# Patient Record
Sex: Female | Born: 1945 | Race: White | Hispanic: No | Marital: Married | Smoking: Former smoker
Health system: Southern US, Community
[De-identification: ages and names within clinical notes are randomized; demographics above are authoritative.]

## PROBLEM LIST (undated history)

## (undated) DIAGNOSIS — C50919 Malignant neoplasm of unspecified site of unspecified female breast: Secondary | ICD-10-CM

## (undated) DIAGNOSIS — I10 Essential (primary) hypertension: Secondary | ICD-10-CM

## (undated) DIAGNOSIS — Z8616 Personal history of COVID-19: Secondary | ICD-10-CM

## (undated) DIAGNOSIS — Z923 Personal history of irradiation: Secondary | ICD-10-CM

## (undated) HISTORY — DX: Personal history of COVID-19: Z86.16

## (undated) HISTORY — PX: PARTIAL MASTECTOMY WITH AXILLARY SENTINEL LYMPH NODE BIOPSY: SHX6004

## (undated) HISTORY — PX: ABDOMINAL HYSTERECTOMY: SHX81

## (undated) HISTORY — PX: COMBINED HYSTEROSCOPY DIAGNOSTIC / D&C: SUR297

---

## 2013-03-30 DIAGNOSIS — Z923 Personal history of irradiation: Secondary | ICD-10-CM

## 2013-03-30 DIAGNOSIS — C50919 Malignant neoplasm of unspecified site of unspecified female breast: Secondary | ICD-10-CM

## 2013-03-30 HISTORY — DX: Malignant neoplasm of unspecified site of unspecified female breast: C50.919

## 2013-03-30 HISTORY — DX: Personal history of irradiation: Z92.3

## 2014-01-08 DIAGNOSIS — I10 Essential (primary) hypertension: Secondary | ICD-10-CM | POA: Diagnosis present

## 2014-01-31 ENCOUNTER — Ambulatory Visit: Payer: Self-pay | Admitting: Physician Assistant

## 2014-02-19 ENCOUNTER — Ambulatory Visit: Payer: Self-pay | Admitting: Physician Assistant

## 2014-02-27 ENCOUNTER — Ambulatory Visit: Payer: Self-pay | Admitting: Physician Assistant

## 2014-02-27 HISTORY — PX: BREAST BIOPSY: SHX20

## 2014-03-05 ENCOUNTER — Other Ambulatory Visit: Payer: Self-pay | Admitting: Surgery

## 2014-03-05 ENCOUNTER — Ambulatory Visit
Admission: RE | Admit: 2014-03-05 | Discharge: 2014-03-05 | Disposition: A | Discharge status: Home / Self Care | Payer: 59 | Source: Ambulatory Visit | Attending: Surgery | Admitting: Surgery

## 2014-03-05 DIAGNOSIS — R920 Mammographic microcalcification found on diagnostic imaging of breast: Secondary | ICD-10-CM

## 2014-03-05 DIAGNOSIS — C50312 Malignant neoplasm of lower-inner quadrant of left female breast: Secondary | ICD-10-CM

## 2014-03-05 MED ORDER — GADOBENATE DIMEGLUMINE 529 MG/ML IV SOLN
12.0000 mL | Freq: Once | INTRAVENOUS | Status: AC | PRN
  Administered 2014-03-05: 12 mL via INTRAVENOUS

## 2014-03-07 ENCOUNTER — Ambulatory Visit: Payer: Self-pay | Admitting: Internal Medicine

## 2014-03-15 ENCOUNTER — Ambulatory Visit: Payer: Self-pay | Admitting: Surgery

## 2014-03-15 HISTORY — PX: BREAST BIOPSY: SHX20

## 2014-03-29 ENCOUNTER — Ambulatory Visit: Payer: Self-pay | Admitting: Surgery

## 2014-03-29 LAB — POTASSIUM: Potassium: 3.9 mmol/L (ref 3.5–5.1)

## 2014-03-30 ENCOUNTER — Ambulatory Visit: Payer: Self-pay | Admitting: Internal Medicine

## 2014-04-03 HISTORY — PX: BREAST LUMPECTOMY: SHX2

## 2014-04-05 ENCOUNTER — Ambulatory Visit: Payer: Self-pay | Admitting: Surgery

## 2014-04-23 ENCOUNTER — Encounter: Payer: Self-pay | Admitting: Internal Medicine

## 2014-04-27 ENCOUNTER — Ambulatory Visit: Payer: Self-pay | Admitting: Surgery

## 2014-04-27 HISTORY — PX: BREAST LUMPECTOMY: SHX2

## 2014-04-30 ENCOUNTER — Ambulatory Visit: Payer: Self-pay | Admitting: Internal Medicine

## 2014-05-29 ENCOUNTER — Ambulatory Visit
Admit: 2014-05-29 | Disposition: A | Discharge status: Home / Self Care | Payer: Self-pay | Attending: Internal Medicine | Admitting: Internal Medicine

## 2014-06-22 LAB — CBC CANCER CENTER
Basophil #: 0.1 x10 3/mm (ref 0.0–0.1)
Basophil %: 1.2 %
Eosinophil #: 0.2 x10 3/mm (ref 0.0–0.7)
Eosinophil %: 4.7 %
HCT: 42.9 % (ref 35.0–47.0)
HGB: 14.3 g/dL (ref 12.0–16.0)
LYMPHS PCT: 30.2 %
Lymphocyte #: 1.4 x10 3/mm (ref 1.0–3.6)
MCH: 30.5 pg (ref 26.0–34.0)
MCHC: 33.4 g/dL (ref 32.0–36.0)
MCV: 92 fL (ref 80–100)
MONOS PCT: 11.1 %
Monocyte #: 0.5 x10 3/mm (ref 0.2–0.9)
Neutrophil #: 2.5 x10 3/mm (ref 1.4–6.5)
Neutrophil %: 52.8 %
PLATELETS: 234 x10 3/mm (ref 150–440)
RBC: 4.69 10*6/uL (ref 3.80–5.20)
RDW: 13.1 % (ref 11.5–14.5)
WBC: 4.7 x10 3/mm (ref 3.6–11.0)

## 2014-06-27 LAB — CBC CANCER CENTER
BASOS ABS: 0.1 x10 3/mm (ref 0.0–0.1)
Basophil %: 1.2 %
EOS ABS: 0.3 x10 3/mm (ref 0.0–0.7)
Eosinophil %: 5.2 %
HCT: 42.6 % (ref 35.0–47.0)
HGB: 14.3 g/dL (ref 12.0–16.0)
LYMPHS ABS: 1.4 x10 3/mm (ref 1.0–3.6)
Lymphocyte %: 27.6 %
MCH: 30.7 pg (ref 26.0–34.0)
MCHC: 33.5 g/dL (ref 32.0–36.0)
MCV: 92 fL (ref 80–100)
Monocyte #: 0.6 x10 3/mm (ref 0.2–0.9)
Monocyte %: 12.4 %
Neutrophil #: 2.7 x10 3/mm (ref 1.4–6.5)
Neutrophil %: 53.6 %
PLATELETS: 229 x10 3/mm (ref 150–440)
RBC: 4.65 10*6/uL (ref 3.80–5.20)
RDW: 13.1 % (ref 11.5–14.5)
WBC: 5 x10 3/mm (ref 3.6–11.0)

## 2014-06-29 ENCOUNTER — Ambulatory Visit
Admit: 2014-06-29 | Disposition: A | Discharge status: Home / Self Care | Payer: Self-pay | Attending: Internal Medicine | Admitting: Internal Medicine

## 2014-07-04 LAB — CBC CANCER CENTER
BASOS ABS: 0.1 x10 3/mm (ref 0.0–0.1)
Basophil %: 1.2 %
EOS ABS: 0.2 x10 3/mm (ref 0.0–0.7)
Eosinophil %: 4.2 %
HCT: 42.4 % (ref 35.0–47.0)
HGB: 14.3 g/dL (ref 12.0–16.0)
Lymphocyte #: 1.2 x10 3/mm (ref 1.0–3.6)
Lymphocyte %: 23.5 %
MCH: 31 pg (ref 26.0–34.0)
MCHC: 33.7 g/dL (ref 32.0–36.0)
MCV: 92 fL (ref 80–100)
Monocyte #: 0.6 x10 3/mm (ref 0.2–0.9)
Monocyte %: 11.9 %
NEUTROS ABS: 3.1 x10 3/mm (ref 1.4–6.5)
Neutrophil %: 59.2 %
PLATELETS: 213 x10 3/mm (ref 150–440)
RBC: 4.62 10*6/uL (ref 3.80–5.20)
RDW: 13 % (ref 11.5–14.5)
WBC: 5.2 x10 3/mm (ref 3.6–11.0)

## 2014-07-11 LAB — CBC CANCER CENTER
BASOS ABS: 0.1 x10 3/mm (ref 0.0–0.1)
Basophil %: 1.7 %
EOS ABS: 0.3 x10 3/mm (ref 0.0–0.7)
Eosinophil %: 5.9 %
HCT: 43.3 % (ref 35.0–47.0)
HGB: 14.6 g/dL (ref 12.0–16.0)
LYMPHS ABS: 1.2 x10 3/mm (ref 1.0–3.6)
Lymphocyte %: 25.7 %
MCH: 30.9 pg (ref 26.0–34.0)
MCHC: 33.6 g/dL (ref 32.0–36.0)
MCV: 92 fL (ref 80–100)
MONO ABS: 0.6 x10 3/mm (ref 0.2–0.9)
Monocyte %: 12.1 %
NEUTROS PCT: 54.6 %
Neutrophil #: 2.6 x10 3/mm (ref 1.4–6.5)
PLATELETS: 227 x10 3/mm (ref 150–440)
RBC: 4.7 10*6/uL (ref 3.80–5.20)
RDW: 13 % (ref 11.5–14.5)
WBC: 4.8 x10 3/mm (ref 3.6–11.0)

## 2014-07-17 LAB — CBC CANCER CENTER
Basophil #: 0.1 x10 3/mm (ref 0.0–0.1)
Basophil %: 1.2 %
EOS ABS: 0.3 x10 3/mm (ref 0.0–0.7)
Eosinophil %: 5.3 %
HCT: 44.1 % (ref 35.0–47.0)
HGB: 14.8 g/dL (ref 12.0–16.0)
LYMPHS PCT: 26.3 %
Lymphocyte #: 1.3 x10 3/mm (ref 1.0–3.6)
MCH: 30.7 pg (ref 26.0–34.0)
MCHC: 33.5 g/dL (ref 32.0–36.0)
MCV: 92 fL (ref 80–100)
Monocyte #: 0.5 x10 3/mm (ref 0.2–0.9)
Monocyte %: 9.7 %
NEUTROS ABS: 2.8 x10 3/mm (ref 1.4–6.5)
NEUTROS PCT: 57.5 %
PLATELETS: 227 x10 3/mm (ref 150–440)
RBC: 4.81 10*6/uL (ref 3.80–5.20)
RDW: 13.4 % (ref 11.5–14.5)
WBC: 4.9 x10 3/mm (ref 3.6–11.0)

## 2014-07-17 LAB — HEPATIC FUNCTION PANEL A (ARMC)
ALK PHOS: 70 U/L
ALT: 16 U/L
AST: 20 U/L
Albumin: 4.4 g/dL
BILIRUBIN TOTAL: 0.7 mg/dL
Bilirubin, Direct: <0.1 mg/dL
Total Protein: 7.4 g/dL

## 2014-07-17 LAB — CREATININE, SERUM
Creatinine: 0.74 mg/dL
EGFR (African American): >60
EGFR (Non-African Amer.): >60

## 2014-07-23 LAB — SURGICAL PATHOLOGY

## 2014-07-29 NOTE — Op Note (Signed)
PATIENT NAME:  Dana Snow, Dana Snow MR#:  [id number] DATE OF BIRTH:  June 02, 1945  DATE OF PROCEDURE:  04/27/2014   PREOPERATIVE DIAGNOSIS: Carcinoma of the left breast.   POSTOPERATIVE DIAGNOSIS: Carcinoma of the left breast.   PROCEDURE: Re-excision of left partial mastectomy.   SURGEON: Rochel Brome, MD    ANESTHESIA: General.   INDICATIONS: This 69 year old female recently had findings of carcinoma of the lower inner quadrant of the left breast with mixed ductal and lobular features. She recently had a left partial mastectomy.  The final pathology demonstrated involvement of the medial margin and re-excision of the medial margin was recommended for further treatment.  It is further noted that her ultrasound prior to her recent surgery, demonstrated a 2.5 cm mass, which I have reviewed today, which did demonstrate that the mass was in close proximity to the underlying muscle and therefore, this excision, today did extend down as far as muscle.   The patient was placed on the operating table in the supine position under general anesthesia. The left breast was prepared with ChloraPrep and draped in a sterile manner.   The incision, which was curvilinear in the lower outer quadrant was reopened beginning with a scalpel and then with blunt dissection and removed old suture material. There was a seroma with some clotted blood within the wound which was evacuated with suction. The wound was inspected and did not identify any obvious tumor within the wound.  I could identify the deep fascia and determine a site for re-excision of the medial margin so that the excision was approximately 3 x 3 cm in thickness, was approximately 7 mm as the medial margin was re-excised with the use of electrocautery.  The orientation was maintained and used margin maps to label the skin edge.  Also the deep margin, the cranial margin, the [facility] margin and also the new medial margin. The specimen was submitted for pathology  fresh.   The wound was further inspected. It is noted that during the course of the procedure, a number of small bleeding points were cauterized.  Next, the tissues surrounding the re-excision were infiltrated with 0.5% Sensorcaine with epinephrine. Also subcutaneous tissues were infiltrated as well. Several small bleeding points were cauterized. Hemostasis was subsequently intact. Next, the subcutaneous tissues were closed with interrupted 4-0 chromic and then the skin was closed with a running 4-0 Monocryl subcuticular suture.   The pathologist did call back to indicate that the microscope studies showed no cancer and tissues have been submitted for routine pathology. The wound was then treated with LiquiBand and allowed to dry.   The patient tolerated surgery satisfactorily and was then prepared for transfer to the recovery room.     ____________________________ Lenna Sciara. Rochel Brome, MD jws:DT D: 04/27/2014 11:57:43 ET T: 04/27/2014 14:46:06 ET JOB#: [id number]  cc: Loreli Dollar, MD, <Dictator> Loreli Dollar MD ELECTRONICALLY SIGNED 04/28/2014 6:50

## 2014-07-29 NOTE — Op Note (Signed)
PATIENT NAME:  Dana Snow, Dana Snow MR#:  [id number] DATE OF BIRTH:  08-Feb-1946  DATE OF PROCEDURE:  04/05/2014  REOPERATIVE DIAGNOSIS: Carcinoma of the left breast.   POSTOPERATIVE DIAGNOSIS: Carcinoma of the left breast.   PROCEDURE: Left partial mastectomy with axillary sentinel lymph node biopsy.   SURGEON: Rochel Brome, MD.   ANESTHESIA: General.   INDICATIONS:  This 69 year old female recently had mammogram depicting evidence of mass formation in the inferior medial aspect of the left breast. Ultrasound demonstrated a mass in this location 6 cm from the nipple and adjacent to the chest wall. Biopsy demonstrated invasive lobular carcinoma. Surgery was recommended for definitive treatment.   DESCRIPTION OF PROCEDURE: The patient did have preoperative ultrasound guided insertion of Kopans wire with followup mammogram, also had injection of radioactive technetium sulfur colloid.    The patient was placed on the operating table in the supine position under general anesthesia. The dressing was removed from the left breast exposing the Kopans wire, which entered the breast at approximately the 8 o'clock position. The Kopans wire was cut 2 cm from the skin. Ultrasound was used to image the mass at the 8 o'clock position 6 cm from the nipple. The mammogram images were also viewed. The breast was prepared with ChloraPrep and draped in a sterile manner.   An incision was made from approximately 7 o'clock to 9 o'clock position of the left breast approximately 6 cm from the nipple and excised an ellipse of skin which was approximately 1 cm in width. The dissection was carried down through subcutaneous tissues and dissection was carried down to encounter the wire. Palpation revealed the location of the tumor. The dissection was carried down deep to surround the tumor, removing normal tissue surrounding the palpable mass and the dissection did extend down to the deep fascia. The specimen was tagged with margin  maps before it was completely removed from the wound to mark the cranial, caudal, medial, lateral, and subsequently deep margin. The 9 o'clock end of the skin ellipse was tagged with a stitch for the pathologist's orientation. The specimen was submitted for specimen mammogram and pathology.   Attention was turned to the axilla which was probed with a gamma counter demonstrating location of radioactivity in the inferior aspect of the left axilla. An oblique incision was made ultimately some 4.5 cm in length, carried down through subcutaneous tissues through superficial fascia, and dissected down deeply within the inferior aspect of the axilla. There was radioactivity found and a lymph node found adjacent to the rib cage which was approximately 8-9 mm in dimension and was removed with some surrounding fatty tissue. The ex vivo count was in the range of 100-130 counts per second. The background count was in the range of 3-21. There was no remaining palpable mass within the axilla, no localized radioactivity. Hemostasis was intact.   The partial mastectomy wound was further inspected and determined hemostasis was intact. It is noted that the radiologist called back to indicate that the biopsy marker and the mass were seen within the specimen mammogram and there did appear to be 1 margin which appeared close. Later the pathologist called back to indicate that the superior margin appeared to be close and recommended re-excision of the superior margin, also recommended re-excision of the inferior margin.     The superior margin was re-excised removing approximately 3 x 3 cm portion of tissue which was approximately 5 mm in thickness, this was oriented so that the new margin was placed on  Telfa and the anterior border of this re-excision was tagged with a stitch which attached it to the Telfa and this was submitted for pathology. Also the inferior margin was re-excised with an approximately 3 x 3 cm x 5 mm margin,  which appeared to be fatty tissue and the new margin was placed on Telfa and a stitch was placed to mark the anterior extent. This was submitted for pathology.   Both wounds were further inspected and determined that hemostasis was intact. Next the axillary wound was closed by placing 4-0 Chromic subcutaneous sutures in and then closed the skin with running 4-0 Monocryl subcuticular suture.   The pathologist called back and had done microscope study on the superior margin and determined that this appeared to be free of tumor and elected to delay the inferior margin for routine pathology.   The partial mastectomy wound was further inspected and determined hemostasis was intact. The skin edges were infiltrated with 0.5% Sensorcaine with epinephrine. The subcutaneous tissues were closed with interrupted 4-0 Chromic and the skin was closed with running 4-0 Monocryl subcuticular suture.   Both wounds were treated with LiquiBand and allowed to dry.     The patient appeared to be in satisfactory condition and was prepared for transfer to the recovery room.     ____________________________ Lenna Sciara. Rochel Brome, MD jws:bu D: 04/05/2014 15:41:12 ET T: 04/05/2014 17:06:14 ET JOB#: [id number]  cc: Loreli Dollar, MD, <Dictator> Loreli Dollar MD ELECTRONICALLY SIGNED 04/14/2014 10:41

## 2014-07-29 NOTE — Consult Note (Signed)
Reason for Visit: This 69 year old Female patient presents to the clinic for initial evaluation of  breast cancer .   Referred by Dr Ma Hillock.  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 ER/PR positive Oncotype DX showing borderline medium risk of recurrence is opted not to take chemotherapy will be on aromatase inhibitor therapy plus whole breast radiation  Pathology Report pathology report reviewed   Imaging Report mammograms reviewed   Referral Report clinical notes reviewed   Planned Treatment Regimen whole breast radiation   HPI   patient is a 69 year old female who presented with an abnormal mammogram of her left breast back in November 2015 showing a 6 mm group in the left breast in the upper inner quadrant.MRI was performed confirming mass in the lower inner quadrant. She also had suspicious lesions in the upper inner quadrant which was biopsied and positive for sclerosing adenosis no evidence of malignancy. She went on to have a wide local excision and central node biopsy of the left breast for 3.8 cm invasive lobular carcinoma with areas of invasive mammary carcinoma. Sentinel lymph node 1 was negative. There was positive lymphovascular invasion. Margin was positive when on to have 0.6 cm residual lobular carcinoma removed making her aggregate dimensions closer to4.4 cm. She had a type BX performed showing borderline medium risk of recurrence with a score of 19. She is opted not to have systemic chemotherapy. She will have aromatase inhibitor therapy after completion of radiation. She is otherwise doing well. She specifically denies breast tenderness cough or bone pain.  Past Hx:    HTN:    Breast Cancer:    Dilation and Curretage:    Hysterectomy:    Colonoscopy:    Left Partial Mastectomy with SN biopsy: Jan  2016  Past, Family and Social History:  Past Medical History positive   Cardiovascular hypertension   Past Surgical History hysterectomy, D&C   Family History positive   Family History Comments grandmother with breast cancer in her 2s   Social History positive   Social History Comments 20 pack smoking history quit 25 years prior occasional EtOH use on a social basis   Additional Past Medical and Surgical History accompanied by her husband today   Allergies:   Penicillin: Hives  Percocet 10/325: Dizzy/Fainting  Codeine: Other  Home Meds:  Home Medications: Medication Instructions Status  hydrochlorothiazide-lisinopril 12.5 mg-20 mg oral tablet 1 tab(s) orally once a day Active   Review of Systems:  General negative   Performance Status (ECOG) 0   Skin negative   Breast see HPI   Ophthalmologic negative   ENMT negative   Respiratory and Thorax negative   Cardiovascular negative   Gastrointestinal negative   Genitourinary negative   Musculoskeletal negative   Neurological negative   Psychiatric negative   Hematology/Lymphatics negative   Endocrine negative   Allergic/Immunologic negative   Review of Systems   denies any weight loss, fatigue, weakness, fever, chills or night sweats. Patient denies any loss of vision, blurred vision. Patient denies any ringing  of the ears or hearing loss. No irregular heartbeat. Patient denies heart murmur or history of fainting. Patient denies any chest pain or pain radiating to her upper extremities. Patient denies any shortness of breath, difficulty breathing at night, cough or hemoptysis. Patient denies any swelling in the lower legs. Patient denies any  nausea vomiting, vomiting of blood, or coffee ground material in the vomitus. Patient denies any stomach pain. Patient states has had normal bowel movements no significant constipation or diarrhea. Patient denies any dysuria, hematuria or significant nocturia. Patient  denies any problems walking, swelling in the joints or loss of balance. Patient denies any skin changes, loss of hair or loss of weight. Patient denies any excessive worrying or anxiety or significant depression. Patient denies any problems with insomnia. Patient denies excessive thirst, polyuria, polydipsia. Patient denies any swollen glands, patient denies easy bruising or easy bleeding. Patient denies any recent infections, allergies or URI. Patient "s visual fields have not changed significantly in recent time.   Nursing Notes:  Nursing Vital Signs and Chemo Nursing Nursing Notes: *CC Vital Signs Flowsheet:   17-Feb-16 14:14  Temp Temperature 97.9  Pulse Pulse 74  Respirations Respirations 18  SBP SBP 134  DBP DBP 74  Pain Scale (0-10)  0  Current Weight (kg) (kg) 59  Height (cm) centimeters 157.4  BSA (m2) 1.5   Physical Exam:  General/Skin/HEENT:  General normal   Skin normal   Eyes normal   ENMT normal   Head and Neck normal   Additional PE well-developed well-nourished female in NAD. Lungs are clear to A&P cardiac examination shows regular rate and rhythm. Left breast is wide local excision in the 8:00 position which is healed well. No dominant mass or nodularity is noted in either breast in 2 positions examined. No axillary or supraclavicular adenopathy is noted. Abdomen is benign.   Breasts/Resp/CV/GI/GU:  Respiratory and Thorax normal   Cardiovascular normal   Gastrointestinal normal   Genitourinary normal   MS/Neuro/Psych/Lymph:  Musculoskeletal normal   Neurological normal   Lymphatics normal   Other Results:  Radiology Results: [place]:    23-Nov-15 09:52, Digital Additional Views Bilateral  Digital Additional Views Bilateral   REASON FOR EXAM:    AV RT MASS AND ASYMMETRY LT DISTORTION  COMMENTS:       PROCEDURE: MAM - MAM DGTL  ADD VW  BIL SCR  - Feb 19 2014  9:52AM     CLINICAL DATA:  69 year old female, callback from  screening  mammogram    EXAM:  DIGITAL DIAGNOSTIC  BILATERAL MAMMOGRAM WITH CAD    ULTRASOUND BILATERAL BREAST    COMPARISON:  01/31/2014, additional priors dating back to 05/01/2010  ACR Breast Density Category c: The breast tissue is heterogeneously  dense, which may obscure small masses.    FINDINGS:  On additional views, no suspicious mass or calcifications is seen in  the region of possible asymmetry seen within the subareolar right  breast on screening mammogram. The possible mass within the medial  right breast at 3 o'clock, posterior depth, and the possible  distortion within the lower, inner left breast persist on additional  views. Additionally, there is a 6 mm group of predominantly punctate  and round calcifications within the upper, inner left breast.    Mammographic images were processed with CAD.    On physical exam, no discrete mass is felt within the medial right  breast.    Targeted ultrasound of the right breast demonstrates an irregular,  hypoechoic mass at 3 o'clock, 1 cm from the nipple measuring 9 x 6 x  6 mm, corresponding to the mass seen mammographically. Targeted  ultrasound of the left breast demonstrates an irregular, hypoechoic,  shadowing mass at 8 o'clock, 6 cm from the nipple measuring 2.5 x  1.8 x 2.4 cm,  corresponding to the architectural distortion seenon  mammogram. No enlarged axillary lymph nodes are identified within  either axilla.     IMPRESSION:  1. Indeterminate bilateral breast masses.  2. Six mm group of round and punctate calcifications within the  upper, inner left breast.  RECOMMENDATION:  1. Ultrasound-guided core biopsies of the mass at 3 o'clock within  the right breast and the mass at 8 o'clock within the left breast.  2. If left breast biopsy demonstrates malignancy and breast  conservation is being considered, MRI may be considered for  determining extent of disease and assessing for chest wall  involvement.  Stereotactic guided biopsy of the left breast  calcifications may also be considered.    I have discussed the findings and recommendations with the patient.  Results were also provided in writing at the conclusion of the  visit. If applicable, a reminder letter will be sent to the patient  regarding the next appointment.    BI-RADS CATEGORY  BI-RADS CATEGORY  5: Highly suggestive of malignancy.      ElectronicallySigned    By: Pamelia Hoit M.D.    On: 02/19/2014 15:15         Verified By: Ples Specter, M.D.,   Relevent Results:   Relevant Scans and Labs mammograms are reviewed   Assessment and Plan: Impression:   stage IIa invasive lobular carcinoma left breast as was wide local excision with sentinel node biopsy in 69 year old female low to medium risk ofrecurrence ononco type DX- tumor is ER/PR positive HER-2/neu not overexpressed. Plan:   this time I have recommended whole breast radiation. Would plan on delivering 5000 cGy over 5 weeks boosting her scar another 1600 cGy using electron beam. Risks and benefits of treatment including skin reaction, fatigue, occlusion of superficial lung, alteration of blood counts, all were explained in detail to the patient. I explained we will do our best to keep her heart completely out of her treatment field. I have set up and ordered CT simulation early next week. Patient will also be candidate for aromatase inhibitor therapy after completion of radiation.  I would like to take this opportunity for allowing me to participate in the care of your patient..  Fax to Physician:  Physicians To Recieve Fax: Paulita Cradle, [place] Rochel Brome - [phone].  Electronic Signatures: Frady Taddeo, Roda Shutters (MD)  (Signed 17-Feb-16 16:01)  Authored: HPI, Diagnosis, Past Hx, PFSH, Allergies, Home Meds, ROS, Nursing Notes, Physical Exam, Other Results, Relevent Results, Encounter Assessment and Plan, Fax to Physician   Last Updated: 17-Feb-16 16:01 by  Armstead Peaks (MD)

## 2014-08-02 ENCOUNTER — Other Ambulatory Visit: Payer: Self-pay

## 2014-08-02 DIAGNOSIS — C50912 Malignant neoplasm of unspecified site of left female breast: Secondary | ICD-10-CM

## 2014-08-09 ENCOUNTER — Other Ambulatory Visit: Payer: Self-pay | Admitting: *Deleted

## 2014-08-09 DIAGNOSIS — C50912 Malignant neoplasm of unspecified site of left female breast: Secondary | ICD-10-CM

## 2014-08-09 DIAGNOSIS — Z79899 Other long term (current) drug therapy: Secondary | ICD-10-CM

## 2014-08-09 DIAGNOSIS — C50311 Malignant neoplasm of lower-inner quadrant of right female breast: Secondary | ICD-10-CM

## 2014-08-10 ENCOUNTER — Telehealth: Payer: Self-pay | Admitting: *Deleted

## 2014-08-10 NOTE — Telephone Encounter (Signed)
Called pt and let her know of the appt date and time.  Left info on voicemail for 5/18 at 9:40.

## 2014-08-10 NOTE — Telephone Encounter (Signed)
-----   Message from [place] sent at 08/10/2014  9:45 AM EDT ----- Regarding: RE: bone density Bone density 08/15/2014 @ 940 appt also mailed. Thanks!  ----- Message -----    From: Luella Cook, RN    Sent: 08/09/2014   5:32 PM      To: Karlene Lineman [place] Subject: bone density                                   Josh entered order for bone density 5/5.  The original order was entered from [place] on 4/21 in SCM.  Can you tell me if it has been scheduled because pt called and asked when is it going to be done. Let me know . Thanks sherry

## 2014-08-15 ENCOUNTER — Ambulatory Visit
Admission: RE | Admit: 2014-08-15 | Discharge: 2014-08-15 | Disposition: A | Discharge status: Home / Self Care | Payer: Medicare Other | Source: Ambulatory Visit | Attending: Internal Medicine | Admitting: Internal Medicine

## 2014-08-15 DIAGNOSIS — C50912 Malignant neoplasm of unspecified site of left female breast: Secondary | ICD-10-CM | POA: Insufficient documentation

## 2014-08-15 DIAGNOSIS — M8580 Other specified disorders of bone density and structure, unspecified site: Secondary | ICD-10-CM | POA: Diagnosis not present

## 2014-09-13 ENCOUNTER — Encounter: Payer: Self-pay | Admitting: Radiation Oncology

## 2014-09-13 ENCOUNTER — Ambulatory Visit
Admission: RE | Admit: 2014-09-13 | Discharge: 2014-09-13 | Disposition: A | Discharge status: Home / Self Care | Payer: Medicare Other | Source: Ambulatory Visit | Attending: Radiation Oncology | Admitting: Radiation Oncology

## 2014-09-13 VITALS — BP 143/84 | HR 83 | Temp 97.8°F | Resp 18 | Wt 129.5 lb

## 2014-09-13 DIAGNOSIS — C50912 Malignant neoplasm of unspecified site of left female breast: Secondary | ICD-10-CM

## 2014-09-13 HISTORY — DX: Essential (primary) hypertension: I10

## 2014-09-13 HISTORY — DX: Malignant neoplasm of unspecified site of unspecified female breast: C50.919

## 2014-09-13 NOTE — Progress Notes (Signed)
Radiation Oncology Follow up Note  Name: Dana Snow   Date:   09/13/2014 MRN:  [id number] DOB: Jan 19, 1946    This 69 y.o. female presents to the clinic today for follow-up for breast cancer stage IIa (T2 N0 M0) lobular carcinoma ER/PR positive HER-2/neu negative status post whole breast radiation to the left breast..  REFERRING PROVIDER: No ref. provider found  HPI: Patient is a 70 year old female now out 1 month having completed whole breast radiation to her left breast for stage IIa (T2 N0 M0) lobular carcinoma left breast of the lower inner quadrant. Oncotype DX showed borderline medium risk of recurrence and she opted not to take chemotherapy. She is currently on aromatase inhibitor therapy tolerating that well. She complains of some slight tightness of the breast and its decreased size. She is otherwise without complaint..  COMPLICATIONS OF TREATMENT: none  FOLLOW UP COMPLIANCE: keeps appointments   PHYSICAL EXAM:  BP 143/84 mmHg  Pulse 83  Temp(Src) 97.8 F (36.6 C)  Resp 18  Wt 129 lb 8.3 oz (58.75 kg) Lungs are clear to A&P cardiac examination essentially unremarkable with regular rate and rhythm. No dominant mass or nodularity is noted in either breast in 2 positions examined. Incision is well-healed. No axillary or supraclavicular adenopathy is appreciated. Cosmetic result is excellent. Left breast is somewhat smaller than the right although to status post wide local excision. Cosmetic result is still excellent. Well-developed well-nourished patient in NAD. HEENT reveals PERLA, EOMI, discs not visualized.  Oral cavity is clear. No oral mucosal lesions are identified. Neck is clear without evidence of cervical or supraclavicular adenopathy. Lungs are clear to A&P. Cardiac examination is essentially unremarkable with regular rate and rhythm without murmur rub or thrill. Abdomen is benign with no organomegaly or masses noted. Motor sensory and DTR levels are equal and symmetric in  the upper and lower extremities. Cranial nerves II through XII are grossly intact. Proprioception is intact. No peripheral adenopathy or edema is identified. No motor or sensory levels are noted. Crude visual fields are within normal range.   RADIOLOGY RESULTS: No recent radiology reports are available for review  PLAN: At the present time she is doing well 1 month out. She continues on aromatase inhibitor therapy without side effect. I'm please were overall progress. I've asked to see her back in about 6 months for follow-up. Patient is to call sooner with any concerns.  I would like to take this opportunity for allowing me to participate in the care of your patient.Armstead Peaks., MD

## 2014-09-24 ENCOUNTER — Encounter: Payer: Self-pay | Admitting: *Deleted

## 2014-09-24 DIAGNOSIS — Z853 Personal history of malignant neoplasm of breast: Secondary | ICD-10-CM

## 2014-09-24 NOTE — Progress Notes (Signed)
Ms. Dana Snow was referred as a potential candidate for the PALLAS study by Dr. Ma Hillock. She was seen on 09/13/14 while at a RT follow up appointment and was given a brief overview of the study and a copy consent form to take home and review. The patient stated she doubted whether she would participate, but that she would take the consent form and review it and let me know. T/C made to the patient this afternoon to inquire about whether she has had time to review the consent form, and if she is interested. Dana Snow states she has reviewed the form, and feels like she is probably a candidate, but is still not interested in participating in the study at this time. Patient thanked for reviewing the consent form. Dr. Ma Hillock notified that the patient has declined participation at this time.

## 2015-01-15 ENCOUNTER — Other Ambulatory Visit: Payer: Self-pay | Admitting: Physician Assistant

## 2015-01-15 DIAGNOSIS — Z853 Personal history of malignant neoplasm of breast: Secondary | ICD-10-CM

## 2015-01-16 ENCOUNTER — Other Ambulatory Visit: Payer: Self-pay

## 2015-01-16 ENCOUNTER — Ambulatory Visit: Payer: Self-pay | Admitting: Oncology

## 2015-01-20 ENCOUNTER — Other Ambulatory Visit: Payer: Self-pay | Admitting: *Deleted

## 2015-01-20 DIAGNOSIS — C50912 Malignant neoplasm of unspecified site of left female breast: Secondary | ICD-10-CM

## 2015-01-21 ENCOUNTER — Inpatient Hospital Stay: Payer: Medicare Other | Attending: Internal Medicine | Admitting: Internal Medicine

## 2015-01-21 ENCOUNTER — Inpatient Hospital Stay: Payer: Medicare Other

## 2015-01-21 VITALS — BP 154/91 | HR 71 | Temp 97.7°F | Resp 16 | Wt 133.4 lb

## 2015-01-21 DIAGNOSIS — R232 Flushing: Secondary | ICD-10-CM | POA: Insufficient documentation

## 2015-01-21 DIAGNOSIS — Z87891 Personal history of nicotine dependence: Secondary | ICD-10-CM | POA: Diagnosis not present

## 2015-01-21 DIAGNOSIS — C50312 Malignant neoplasm of lower-inner quadrant of left female breast: Secondary | ICD-10-CM

## 2015-01-21 DIAGNOSIS — I10 Essential (primary) hypertension: Secondary | ICD-10-CM | POA: Insufficient documentation

## 2015-01-21 DIAGNOSIS — Z170 Estrogen receptor positive status [ER+]: Secondary | ICD-10-CM | POA: Diagnosis not present

## 2015-01-21 DIAGNOSIS — Z79811 Long term (current) use of aromatase inhibitors: Secondary | ICD-10-CM | POA: Insufficient documentation

## 2015-01-21 DIAGNOSIS — R197 Diarrhea, unspecified: Secondary | ICD-10-CM | POA: Diagnosis not present

## 2015-01-21 DIAGNOSIS — Z923 Personal history of irradiation: Secondary | ICD-10-CM

## 2015-01-21 DIAGNOSIS — Z79899 Other long term (current) drug therapy: Secondary | ICD-10-CM | POA: Diagnosis not present

## 2015-01-21 DIAGNOSIS — C50912 Malignant neoplasm of unspecified site of left female breast: Secondary | ICD-10-CM

## 2015-01-21 LAB — CBC WITH DIFFERENTIAL/PLATELET
BASOS PCT: 1 %
Basophils Absolute: 0.1 10*3/uL (ref 0–0.1)
EOS ABS: 0.4 10*3/uL (ref 0–0.7)
Eosinophils Relative: 7 %
HCT: 42.8 % (ref 35.0–47.0)
Hemoglobin: 14.3 g/dL (ref 12.0–16.0)
Lymphocytes Relative: 32 %
Lymphs Abs: 1.7 10*3/uL (ref 1.0–3.6)
MCH: 30.7 pg (ref 26.0–34.0)
MCHC: 33.5 g/dL (ref 32.0–36.0)
MCV: 91.7 fL (ref 80.0–100.0)
Monocytes Absolute: 0.5 10*3/uL (ref 0.2–0.9)
Monocytes Relative: 9 %
Neutro Abs: 2.8 10*3/uL (ref 1.4–6.5)
Neutrophils Relative %: 51 %
Platelets: 242 10*3/uL (ref 150–440)
RBC: 4.67 MIL/uL (ref 3.80–5.20)
RDW: 13 % (ref 11.5–14.5)
WBC: 5.4 10*3/uL (ref 3.6–11.0)

## 2015-01-21 LAB — HEPATIC FUNCTION PANEL
ALBUMIN: 4.3 g/dL (ref 3.5–5.0)
ALT: 20 U/L (ref 14–54)
AST: 11 U/L — AB (ref 15–41)
Alkaline Phosphatase: 71 U/L (ref 38–126)
BILIRUBIN TOTAL: 0.7 mg/dL (ref 0.3–1.2)
Bilirubin, Direct: <0.1 mg/dL — ABNORMAL LOW (ref 0.1–0.5)
Total Protein: 7.2 g/dL (ref 6.5–8.1)

## 2015-01-21 LAB — CREATININE, SERUM: CREATININE: 0.84 mg/dL (ref 0.44–1.00)

## 2015-01-21 MED ORDER — LETROZOLE 2.5 MG PO TABS: 2.5000 mg | ORAL_TABLET | Freq: Every day | ORAL | Status: DC

## 2015-01-21 MED ORDER — VENLAFAXINE HCL ER 37.5 MG PO CP24: 37.5000 mg | ORAL_CAPSULE | Freq: Every day | ORAL | Status: DC

## 2015-01-21 NOTE — Progress Notes (Signed)
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   [name] stopped taking her Arimidex approximately 3 weeks ago because of profuse diarrhea Dana month or so after starting it. She also developed significant hot flashes and stopped taking Arimidex.  REVIEW OF SYSTEMS:  Dana complete 10 point review of system is done which is negative except mentioned above/history of present illness.   PAST MEDICAL HISTORY :  Past Medical History  Diagnosis Date  . Breast cancer     left  . Hypertension     PAST SURGICAL HISTORY :   Past Surgical History  Procedure Laterality Date  . Combined hysteroscopy diagnostic / d&c    . Abdominal hysterectomy    . Partial mastectomy with axillary sentinel lymph node biopsy Left     FAMILY HISTORY :  No family history on file.  SOCIAL HISTORY:   Social History  Substance Use Topics  . Smoking status: Former Smoker -- 1.50 packs/day    Types: Cigarettes  . Smokeless tobacco: Never Used  . Alcohol Use: 1.2 oz/week    2 Glasses of wine per week    ALLERGIES:  is allergic to codone; percocet; and penicillins.  MEDICATIONS:  Current Outpatient Prescriptions  Medication Sig Dispense Refill  . Calcium-Vitamin D 600-200 MG-UNIT per tablet Take by mouth.    Marland Kitchen lisinopril-hydrochlorothiazide (PRINZIDE,ZESTORETIC) 20-12.5 MG per tablet TAKE ONE  TABLET BY MOUTH ONCE Dana DAY    . anastrozole (ARIMIDEX) 1 MG tablet     . letrozole (FEMARA) 2.5 MG tablet Take 1 tablet (2.5 mg total) by mouth daily. 90 tablet 3  . venlafaxine XR (EFFEXOR-XR) 37.5 MG 24 hr capsule Take 1 capsule (37.5 mg total) by mouth daily with breakfast. 30 capsule 6   No current facility-administered medications for this visit.    PHYSICAL EXAMINATION: ECOG PERFORMANCE STATUS: 0 - Asymptomatic  BP 154/91 mmHg  Pulse 71  Temp(Src) 97.7 F (36.5 C) (Tympanic)  Resp 16  Wt 133 lb 6.1 oz (60.5 kg)  Filed Weights   01/21/15 1144  Weight: 133 lb 6.1 oz (60.5 kg)    GENERAL: Well-nourished well-developed; Alert, no distress and comfortable.   Alone. EYES: no pallor or icterus OROPHARYNX: no thrush or ulceration; good dentition  NECK: supple, no masses felt LYMPH:  no palpable lymphadenopathy in the cervical, axillary or inguinal regions LUNGS: clear to auscultation and  No wheeze or crackles HEART/CVS: regular rate & rhythm and no murmurs; No lower extremity edema ABDOMEN:abdomen soft, non-tender and normal bowel sounds Musculoskeletal:no cyanosis of digits and no clubbing  PSYCH: alert & oriented x 3 with fluent speech NEURO: no focal motor/sensory deficits SKIN:  no rashes or significant lesions Bilateral breast exam- no dominant masses felt/no nipple changes. Left breast- lower inner quadrant scar noted well-healing.  LABORATORY DATA:  I have reviewed the data as listed    Component Value Date/Time  K 3.9 03/29/2014 1402   CREATININE 0.84 01/21/2015 1126   CREATININE 0.74 07/17/2014 1005   PROT 7.2 01/21/2015 1126   PROT 7.4 07/17/2014 1005   ALBUMIN 4.3 01/21/2015 1126   ALBUMIN 4.4 07/17/2014 1005   AST 11* 01/21/2015 1126   AST 20 07/17/2014 1005   ALT 20 01/21/2015 1126   ALT 16 07/17/2014 1005   ALKPHOS 71 01/21/2015 1126   ALKPHOS 70 07/17/2014 1005   BILITOT 0.7 01/21/2015 1126   BILITOT 0.7 07/17/2014 1005   GFRNONAA >60 01/21/2015  1126   GFRNONAA >60 07/17/2014 1005   GFRAA >60 01/21/2015 1126   GFRAA >60 07/17/2014 1005    No results found for: SPEP, UPEP  Lab Results  Component Value Date   WBC 5.4 01/21/2015   NEUTROABS 2.8 01/21/2015   HGB 14.3 01/21/2015   HCT 42.8 01/21/2015   MCV 91.7 01/21/2015   PLT 242 01/21/2015      Chemistry      Component Value Date/Time   K 3.9 03/29/2014 1402   CREATININE 0.84 01/21/2015 1126   CREATININE 0.74 07/17/2014 1005      Component Value Date/Time   ALKPHOS 71 01/21/2015 1126   ALKPHOS 70 07/17/2014 1005   AST 11* 01/21/2015 1126   AST 20 07/17/2014 1005   ALT 20 01/21/2015 1126   ALT 16 07/17/2014 1005   BILITOT 0.7 01/21/2015 1126   BILITOT 0.7 07/17/2014 1005       RADIOGRAPHIC STUDIES: I have personally reviewed the radiological images as listed and agreed with the findings in the report. No results found.   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 Intermediate risk Oncotype; declined chemotherapy. Started on Arimidex in April 2016; stopped in October 2016 because of side effects/intolerance.  I had Dana long discussion with [name] regarding the concerns of disease recurrence given stage II disease; and the fact that she did not get any chemotherapy. I would recommend Dana trial of Dana different aromatase inhibitor- she is interested in Femara. I again discussed the potential side effects.  # With regards to hot flashes start [name] on Effexor.  # Will follow the [name] in approximately 3 months to review the side effect profile. [name] agrees with the plan.   Orders Placed This Encounter  Procedures  . CBC with Differential/Platelet  . Creatinine, serum  . Hepatic function panel   All questions were answered. The [name] knows to call the clinic with any problems, questions or concerns. No barriers to learning was detected. I spent 25 minutes counseling the [name]  face to face. The total time spent in the appointment was 30 minutes and more than 50% was on counseling and review of test results     Cammie Sickle, MD 01/21/2015 12:20 PM

## 2015-01-21 NOTE — Progress Notes (Signed)
Nurse assted with breast exam

## 2015-01-23 ENCOUNTER — Other Ambulatory Visit: Payer: Self-pay

## 2015-01-23 ENCOUNTER — Ambulatory Visit: Payer: Self-pay | Admitting: Internal Medicine

## 2015-02-08 ENCOUNTER — Ambulatory Visit
Admission: RE | Admit: 2015-02-08 | Discharge: 2015-02-08 | Disposition: A | Discharge status: Home / Self Care | Payer: Medicare Other | Source: Ambulatory Visit | Attending: Physician Assistant | Admitting: Physician Assistant

## 2015-02-08 ENCOUNTER — Other Ambulatory Visit: Payer: Self-pay | Admitting: Physician Assistant

## 2015-02-08 DIAGNOSIS — Z853 Personal history of malignant neoplasm of breast: Secondary | ICD-10-CM

## 2015-02-08 DIAGNOSIS — Z9889 Other specified postprocedural states: Secondary | ICD-10-CM | POA: Insufficient documentation

## 2015-02-08 DIAGNOSIS — N6001 Solitary cyst of right breast: Secondary | ICD-10-CM | POA: Diagnosis not present

## 2015-04-01 ENCOUNTER — Ambulatory Visit: Payer: Medicare Other | Admitting: Radiation Oncology

## 2015-04-21 IMAGING — MG MM POST US BIOPSY *L*
2 series · 2 of 2 positions shown · non-contrast
Comparison: 02/27/2014 and earlier.

CLINICAL DATA: Left breast cancer

EXAM:
NEEDLE LOCALIZATION OF THE LEFT BREAST WITH ULTRASOUND GUIDANCE

[L ML]
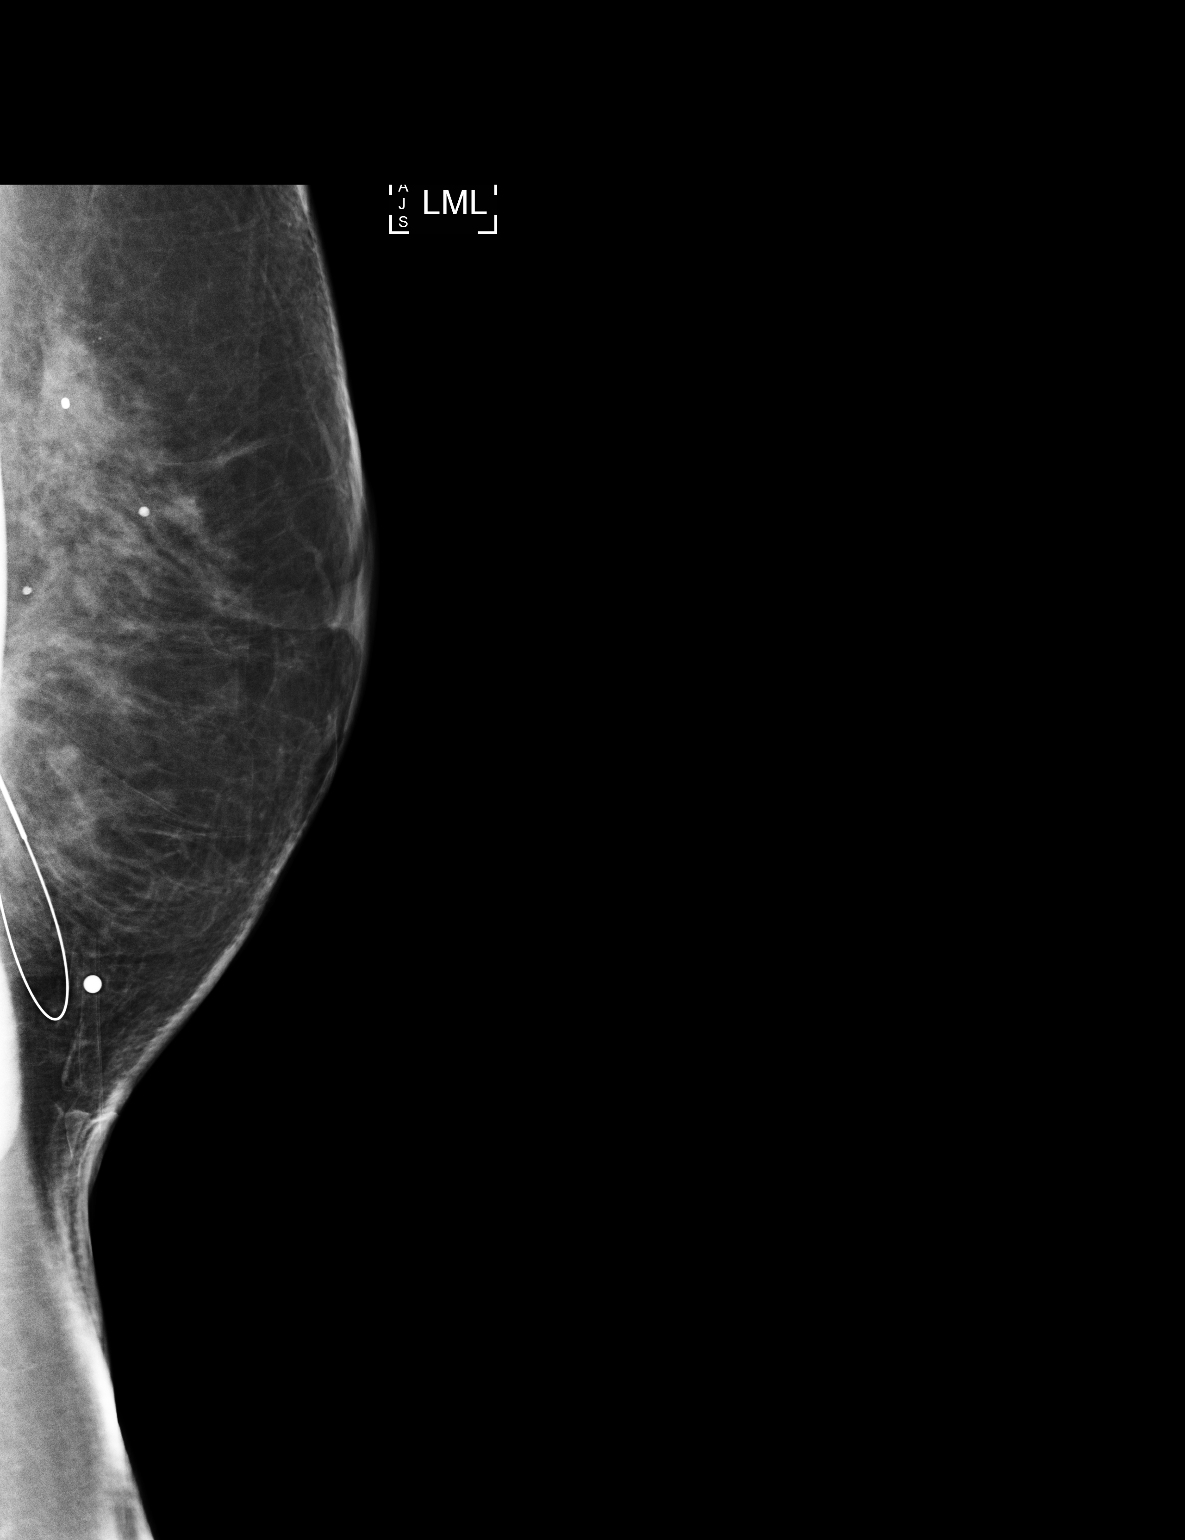

[L CC]
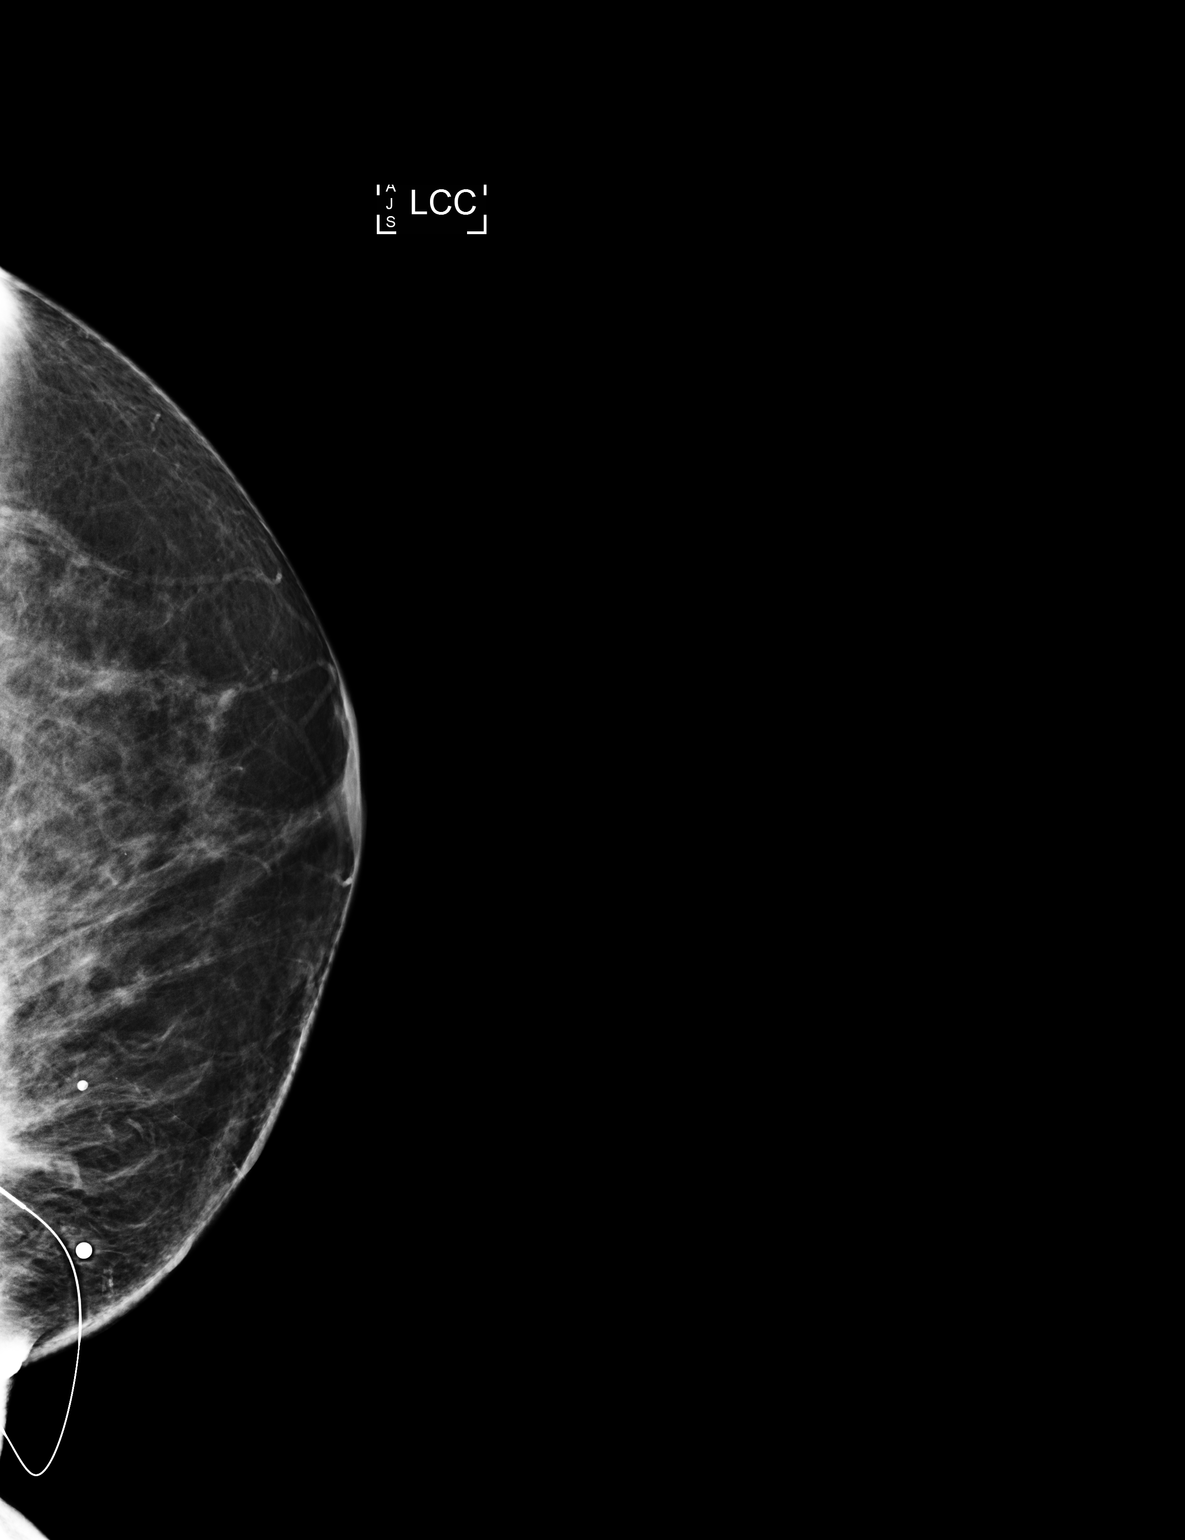

[2 of 2 positions shown; findings below may reference images not displayed]

FINDINGS: Patient presents for needle localization prior to lumpectomy for
biopsy-proven left breast cancer. I met with the patient and we
discussed the procedure of needle localization including benefits
and alternatives. We discussed the high likelihood of a successful
procedure. We discussed the risks of the procedure, including
infection, bleeding, tissue injury, and further surgery. Informed,
written consent was given. The usual time-out protocol was performed
immediately prior to the procedure.

Using ultrasound guidance, sterile technique, 2% lidocaine and a 5
cm modified Kopans needle, ultrasound localized using an inferior
approach. The films were marked for Dr. Rudi.
IMPRESSION: Needle localization of an 8 o'clock left breast mass. No apparent
complications.

## 2015-04-21 IMAGING — MG MM BREAST SURGICAL SPECIMEN
1 series · 1 of 1 positions shown · non-contrast
Comparison: 04/05/2014 and earlier.

CLINICAL DATA: Left breast cancer

EXAM:
SPECIMEN RADIOGRAPH OF THE LEFT BREAST

[L SPECIMEN]
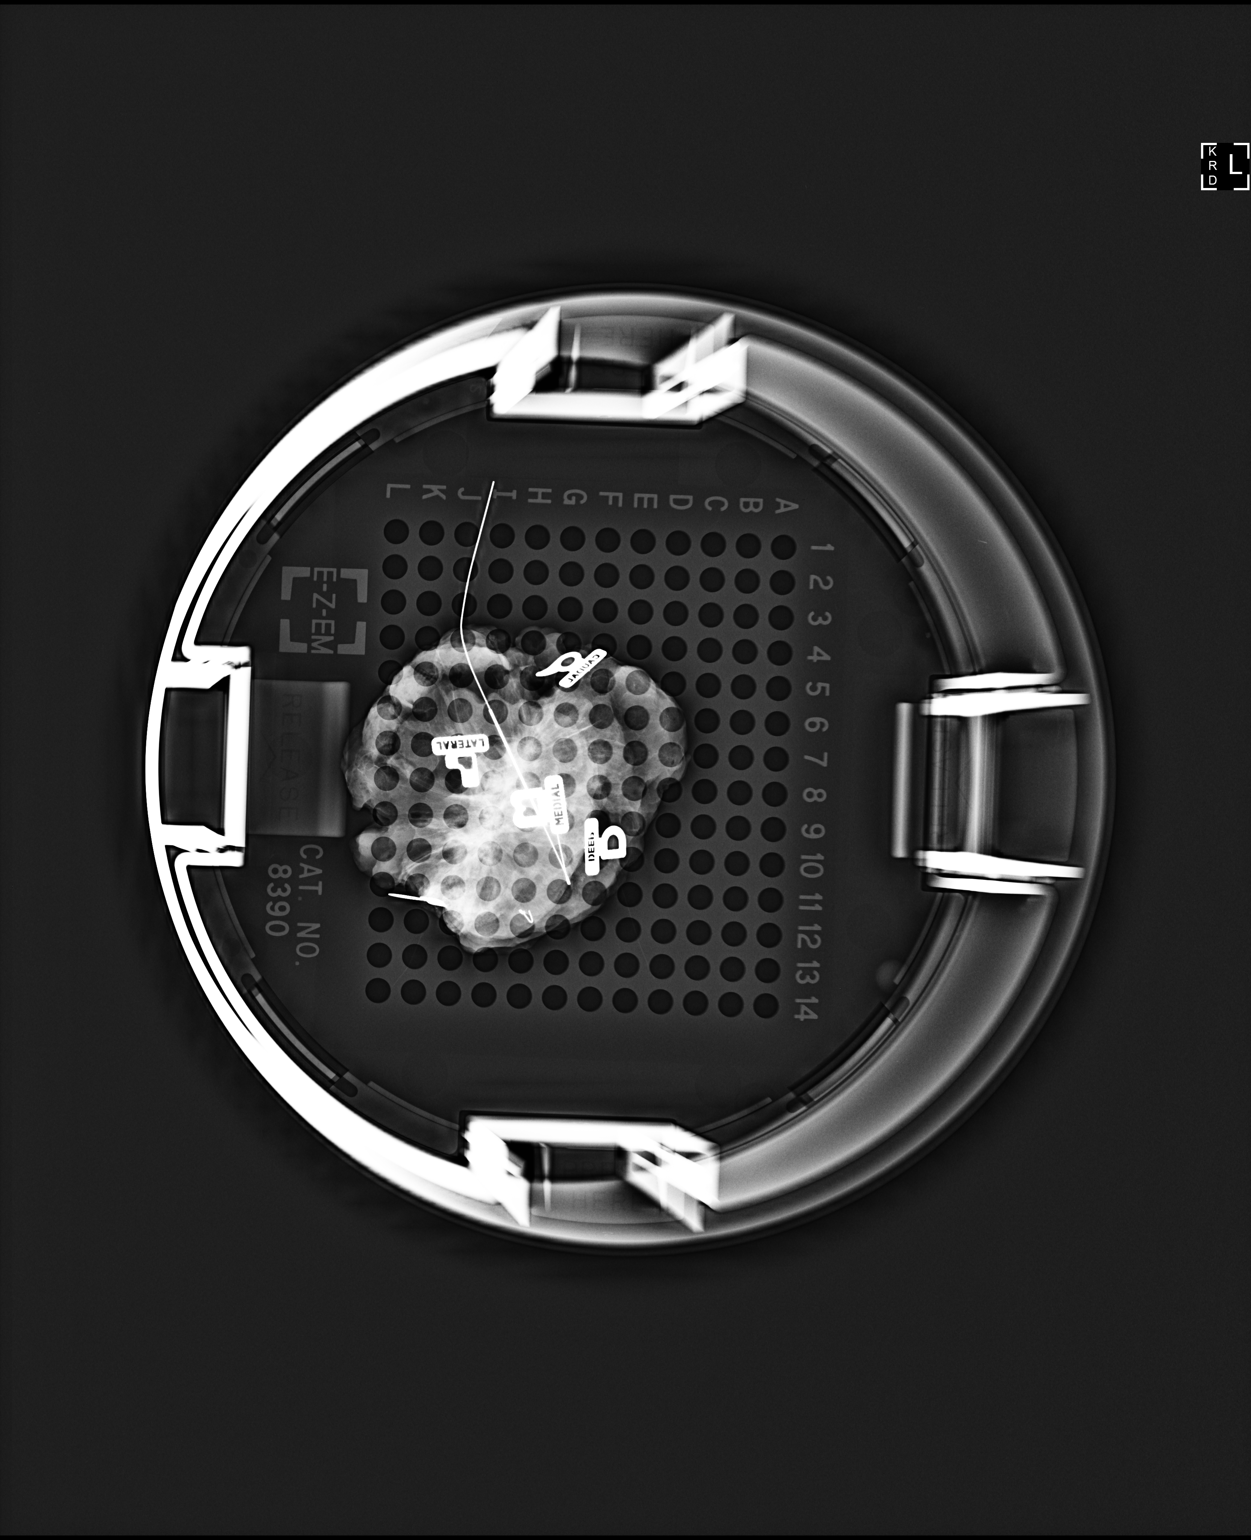

[1 of 1 positions shown; findings below may reference images not displayed]

FINDINGS: The patient is status post surgical excision of a biopsy-proven 8
o'clock left breast mass. The biopsy marker clip is present in the
specimen. The needle localization wire is also present within the
specimen. Increased density is along the posterior margin.

Dr. Jacques is aware of the above.
IMPRESSION: Specimen radiograph of the malignant left breast mass as described
above.

## 2015-04-24 ENCOUNTER — Inpatient Hospital Stay: Payer: Medicare Other | Attending: Internal Medicine | Admitting: Internal Medicine

## 2015-04-24 ENCOUNTER — Encounter: Payer: Self-pay | Admitting: Internal Medicine

## 2015-04-24 VITALS — BP 132/67 | HR 85 | Temp 98.1°F | Resp 18 | Ht 62.5 in | Wt 133.8 lb

## 2015-04-24 DIAGNOSIS — C50912 Malignant neoplasm of unspecified site of left female breast: Secondary | ICD-10-CM | POA: Diagnosis not present

## 2015-04-24 DIAGNOSIS — Z87891 Personal history of nicotine dependence: Secondary | ICD-10-CM | POA: Diagnosis not present

## 2015-04-24 DIAGNOSIS — Z79811 Long term (current) use of aromatase inhibitors: Secondary | ICD-10-CM | POA: Diagnosis not present

## 2015-04-24 DIAGNOSIS — Z170 Estrogen receptor positive status [ER+]: Secondary | ICD-10-CM | POA: Diagnosis not present

## 2015-04-24 DIAGNOSIS — M8580 Other specified disorders of bone density and structure, unspecified site: Secondary | ICD-10-CM | POA: Diagnosis not present

## 2015-04-24 DIAGNOSIS — Z923 Personal history of irradiation: Secondary | ICD-10-CM | POA: Insufficient documentation

## 2015-04-24 DIAGNOSIS — I10 Essential (primary) hypertension: Secondary | ICD-10-CM | POA: Diagnosis not present

## 2015-04-24 DIAGNOSIS — Z79899 Other long term (current) drug therapy: Secondary | ICD-10-CM

## 2015-04-24 DIAGNOSIS — C50312 Malignant neoplasm of lower-inner quadrant of left female breast: Secondary | ICD-10-CM

## 2015-04-24 NOTE — Progress Notes (Signed)
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 Smith]]  ER/PR- >90%; Her 2 Nue- NEG; Lumpec & SLNBx Oncotype-RS=19 No Chemo; s/p RT [Dr.Crystal] April 2016- Arimidex; Stopped Oct 2016; START OCT 2016- Femara  # Hot flashes- resolved  # BMD- Osteopenia [may 2016]  INTERVAL HISTORY:  A very pleasant 70 year old female patient with above history of stage II left-sided breast cancer currently on adjuvant AI since April 2016 is here for follow-up.   Given her intolerance to Arimidex she was started on Femara in October 2016. Hot flashes resolved by itself. She has noted significant improvement in her small joint pain.  No diarrhea; she is putting on weight.   REVIEW OF SYSTEMS:  A complete 10 point review of system is done which is negative except mentioned above/history of present illness.   PAST MEDICAL HISTORY :  Past Medical History  Diagnosis Date  . Hypertension   . Breast cancer ([place]) 2015    left - radiation treatment    PAST SURGICAL HISTORY :   Past Surgical History  Procedure Laterality Date  . Combined hysteroscopy diagnostic / d&c    . Abdominal hysterectomy    . Partial mastectomy with axillary sentinel lymph node biopsy Left   . Breast lumpectomy Left 04/03/2014 & 04/27/2014  . Breast biopsy Right     Stereo biopsy    FAMILY HISTORY :  No family history on file.  SOCIAL HISTORY:   Social History  Substance Use Topics  . Smoking status: Former Smoker -- 1.50 packs/day    Types: Cigarettes  . Smokeless tobacco: Never Used  . Alcohol Use: 1.2 oz/week    2 Glasses of wine per week    ALLERGIES:  is allergic to codone; percocet; and penicillins.  MEDICATIONS:  Current Outpatient Prescriptions  Medication Sig Dispense Refill  . Calcium-Vitamin D  600-200 MG-UNIT per tablet Take by mouth.    . letrozole (FEMARA) 2.5 MG tablet Take 1 tablet (2.5 mg total) by mouth daily. 90 tablet 3  . lisinopril-hydrochlorothiazide (PRINZIDE,ZESTORETIC) 20-12.5 MG per tablet TAKE ONE TABLET BY MOUTH ONCE A DAY    . Probiotic Product (PROBIOTIC DAILY PO) Take 1 tablet by mouth daily.     No current facility-administered medications for this visit.    PHYSICAL EXAMINATION: ECOG PERFORMANCE STATUS: 0 - Asymptomatic  BP 132/67 mmHg  Pulse 85  Temp(Src) 98.1 F (36.7 C) (Tympanic)  Resp 18  Ht 5' 2.5" (1.588 m)  Wt 133 lb 13.1 oz (60.7 kg)  BMI 24.07 kg/m2  Filed Weights   04/24/15 1018  Weight: 133 lb 13.1 oz (60.7 kg)    GENERAL: Well-nourished well-developed; Alert, no distress and comfortable.   Alone. EYES: no pallor or icterus OROPHARYNX: no thrush or ulceration; good dentition  NECK: supple, no masses felt LYMPH:  no palpable lymphadenopathy in the cervical, axillary or inguinal regions LUNGS: clear to auscultation and  No wheeze or crackles HEART/CVS: regular rate & rhythm and no murmurs; No lower extremity edema ABDOMEN:abdomen soft, non-tender and normal bowel sounds Musculoskeletal:no cyanosis of digits and no clubbing  PSYCH: alert & oriented x 3 with fluent speech NEURO: no focal motor/sensory deficits SKIN:  no rashes or significant lesions   LABORATORY DATA:  I have reviewed the data as listed  Component Value Date/Time   K 3.9 03/29/2014 1402   CREATININE 0.84 01/21/2015 1126   CREATININE 0.74 07/17/2014 1005   PROT 7.2 01/21/2015 1126   PROT 7.4 07/17/2014 1005   ALBUMIN 4.3 01/21/2015 1126   ALBUMIN 4.4 07/17/2014 1005   AST 11* 01/21/2015 1126   AST 20 07/17/2014 1005   ALT 20 01/21/2015 1126   ALT 16 07/17/2014 1005   ALKPHOS 71 01/21/2015 1126   ALKPHOS 70 07/17/2014 1005   BILITOT 0.7 01/21/2015 1126   BILITOT 0.7 07/17/2014 1005   GFRNONAA >60 01/21/2015 1126   GFRNONAA >60 07/17/2014 1005   GFRAA  >60 01/21/2015 1126   GFRAA >60 07/17/2014 1005    No results found for: SPEP, UPEP  Lab Results  Component Value Date   WBC 5.4 01/21/2015   NEUTROABS 2.8 01/21/2015   HGB 14.3 01/21/2015   HCT 42.8 01/21/2015   MCV 91.7 01/21/2015   PLT 242 01/21/2015      Chemistry      Component Value Date/Time   K 3.9 03/29/2014 1402   CREATININE 0.84 01/21/2015 1126   CREATININE 0.74 07/17/2014 1005      Component Value Date/Time   ALKPHOS 71 01/21/2015 1126   ALKPHOS 70 07/17/2014 1005   AST 11* 01/21/2015 1126   AST 20 07/17/2014 1005   ALT 20 01/21/2015 1126   ALT 16 07/17/2014 1005   BILITOT 0.7 01/21/2015 1126   BILITOT 0.7 07/17/2014 1005       RADIOGRAPHIC STUDIES: I have personally reviewed the radiological images as listed and agreed with the findings in the report. No results found.   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 Intermediate risk Oncotype; declined chemotherapy.Started on Arimidex in April 2016. Switched over to Femara from Arimidex in October 2016.   # Patient tolerating Femara very well. Patient continues to be in Vitamin D Twice a Day.  # With regards to hot flashes -resolved  # Since patient is clinically doing well; she'll follow-up with me in 6 months/labs.  # 15 minutes face-to-face with the patient discussing the above plan of care; more than 50% of time spent on prognosis/ natural history; counseling and coordination.     Cammie Sickle, MD 04/24/2015 10:28 AM

## 2015-04-29 ENCOUNTER — Ambulatory Visit
Admission: RE | Admit: 2015-04-29 | Discharge: 2015-04-29 | Disposition: A | Discharge status: Home / Self Care | Payer: Medicare Other | Source: Ambulatory Visit | Attending: Radiation Oncology | Admitting: Radiation Oncology

## 2015-04-29 ENCOUNTER — Encounter: Payer: Self-pay | Admitting: Radiation Oncology

## 2015-04-29 VITALS — BP 127/77 | HR 84 | Temp 97.3°F | Resp 18 | Wt 133.8 lb

## 2015-04-29 DIAGNOSIS — C50312 Malignant neoplasm of lower-inner quadrant of left female breast: Secondary | ICD-10-CM

## 2015-04-29 NOTE — Progress Notes (Signed)
.  Radiation Oncology Follow up Note  Name: Dana Snow   Date:   04/29/2015 MRN:  [id number] DOB: 1946-02-02    This 70 y.o. female presents to the clinic today for follow-up for stage IIa lobular carcinoma left breast status post whole breast radiation. Now out 7 months.  REFERRING PROVIDER: [name], MD  HPI: Patient is a 70 year old female now out 7 months having completed whole breast radiation to her left breast for stage IIa invasive lobular carcinoma ER/PR positive HER-2/neu not overexpressed. She is seen today in routine follow-up and is doing well specifically denies breast tenderness cough or bone pain. She has had follow-up mammograms which are fine currently on Femara tolerating that well without side effect..  COMPLICATIONS OF TREATMENT: none  FOLLOW UP COMPLIANCE: keeps appointments   PHYSICAL EXAM:  BP 127/77 mmHg  Pulse 84  Temp(Src) 97.3 F (36.3 C)  Resp 18  Wt 133 lb 13.1 oz (60.7 kg) Lungs are clear to A&P cardiac examination essentially unremarkable with regular rate and rhythm. No dominant mass or nodularity is noted in either breast in 2 positions examined. Incision is well-healed. No axillary or supraclavicular adenopathy is appreciated. Cosmetic result is excellent. Well-developed well-nourished patient in NAD. HEENT reveals PERLA, EOMI, discs not visualized.  Oral cavity is clear. No oral mucosal lesions are identified. Neck is clear without evidence of cervical or supraclavicular adenopathy. Lungs are clear to A&P. Cardiac examination is essentially unremarkable with regular rate and rhythm without murmur rub or thrill. Abdomen is benign with no organomegaly or masses noted. Motor sensory and DTR levels are equal and symmetric in the upper and lower extremities. Cranial nerves II through XII are grossly intact. Proprioception is intact. No peripheral adenopathy or edema is identified. No motor or sensory levels are noted. Crude visual fields are  within normal range.  RADIOLOGY RESULTS: Recent mammograms are reviewed  PLAN: Present time patient continues to do well with no evidence of disease now 7 months out from whole breast radiation for her invasive lobular carcinoma. I've asked to see her back in 1 year for follow-up. She continues close follow-up care with surgeon and medical oncology. She continues on Femara without side effect. Patient is to call sooner with any concerns.  I would like to take this opportunity for allowing me to participate in the care of your patient.Armstead Peaks., MD

## 2015-10-23 ENCOUNTER — Other Ambulatory Visit: Payer: Medicare Other

## 2015-10-23 ENCOUNTER — Ambulatory Visit: Payer: Medicare Other | Admitting: Internal Medicine

## 2015-11-08 ENCOUNTER — Other Ambulatory Visit: Payer: Self-pay

## 2015-11-08 ENCOUNTER — Inpatient Hospital Stay: Payer: Medicare Other

## 2015-11-08 ENCOUNTER — Other Ambulatory Visit: Payer: Self-pay | Admitting: *Deleted

## 2015-11-08 ENCOUNTER — Inpatient Hospital Stay: Payer: Medicare Other | Attending: Internal Medicine | Admitting: Internal Medicine

## 2015-11-08 VITALS — BP 106/71 | HR 69 | Temp 97.8°F | Resp 18 | Wt 132.6 lb

## 2015-11-08 DIAGNOSIS — Z79899 Other long term (current) drug therapy: Secondary | ICD-10-CM | POA: Diagnosis not present

## 2015-11-08 DIAGNOSIS — Z79811 Long term (current) use of aromatase inhibitors: Secondary | ICD-10-CM | POA: Diagnosis not present

## 2015-11-08 DIAGNOSIS — M8580 Other specified disorders of bone density and structure, unspecified site: Secondary | ICD-10-CM | POA: Diagnosis not present

## 2015-11-08 DIAGNOSIS — I10 Essential (primary) hypertension: Secondary | ICD-10-CM | POA: Diagnosis not present

## 2015-11-08 DIAGNOSIS — C50312 Malignant neoplasm of lower-inner quadrant of left female breast: Secondary | ICD-10-CM | POA: Insufficient documentation

## 2015-11-08 DIAGNOSIS — Z170 Estrogen receptor positive status [ER+]: Secondary | ICD-10-CM | POA: Diagnosis not present

## 2015-11-08 DIAGNOSIS — Z923 Personal history of irradiation: Secondary | ICD-10-CM | POA: Insufficient documentation

## 2015-11-08 DIAGNOSIS — Z87891 Personal history of nicotine dependence: Secondary | ICD-10-CM | POA: Diagnosis not present

## 2015-11-08 DIAGNOSIS — Z853 Personal history of malignant neoplasm of breast: Secondary | ICD-10-CM

## 2015-11-08 LAB — COMPREHENSIVE METABOLIC PANEL
ALBUMIN: 4.3 g/dL (ref 3.5–5.0)
ALK PHOS: 82 U/L (ref 38–126)
ALT: 13 U/L — AB (ref 14–54)
AST: 17 U/L (ref 15–41)
Anion gap: 6 (ref 5–15)
BILIRUBIN TOTAL: 0.6 mg/dL (ref 0.3–1.2)
BUN: 15 mg/dL (ref 6–20)
CALCIUM: 9.8 mg/dL (ref 8.9–10.3)
CO2: 27 mmol/L (ref 22–32)
Chloride: 106 mmol/L (ref 101–111)
Creatinine, Ser: 0.83 mg/dL (ref 0.44–1.00)
GFR calc Af Amer: >60 mL/min (ref >60)
GFR calc non Af Amer: >60 mL/min (ref >60)
GLUCOSE: 71 mg/dL (ref 65–99)
POTASSIUM: 4.3 mmol/L (ref 3.5–5.1)
Sodium: 139 mmol/L (ref 135–145)
TOTAL PROTEIN: 7.3 g/dL (ref 6.5–8.1)

## 2015-11-08 LAB — CBC WITH DIFFERENTIAL/PLATELET
BASOS ABS: 0.1 10*3/uL (ref 0–0.1)
BASOS PCT: 1 %
EOS PCT: 4 %
Eosinophils Absolute: 0.3 10*3/uL (ref 0–0.7)
HEMATOCRIT: 40.7 % (ref 35.0–47.0)
Hemoglobin: 14.2 g/dL (ref 12.0–16.0)
Lymphocytes Relative: 33 %
Lymphs Abs: 2 10*3/uL (ref 1.0–3.6)
MCH: 31.3 pg (ref 26.0–34.0)
MCHC: 34.9 g/dL (ref 32.0–36.0)
MCV: 89.8 fL (ref 80.0–100.0)
MONO ABS: 0.5 10*3/uL (ref 0.2–0.9)
Monocytes Relative: 9 %
NEUTROS ABS: 3.2 10*3/uL (ref 1.4–6.5)
Neutrophils Relative %: 53 %
PLATELETS: 238 10*3/uL (ref 150–440)
RBC: 4.53 MIL/uL (ref 3.80–5.20)
RDW: 13.6 % (ref 11.5–14.5)
WBC: 6.1 10*3/uL (ref 3.6–11.0)

## 2015-11-08 NOTE — Progress Notes (Signed)
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 Smith]]  ER/PR- >90%; Her 2 Nue- NEG; Lumpec & SLNBx Oncotype-RS=19 No Chemo; s/p RT [Dr.Crystal] April 2016- Arimidex; Stopped Oct 2016; START OCT 2016- Femara  # Hot flashes- resolved  # BMD- Osteopenia [may 2016]     Breast cancer of lower-inner quadrant of left female breast ([place])   01/21/2015 Initial Diagnosis    Breast cancer of lower-inner quadrant of left female breast [facility])       INTERVAL HISTORY:  A very pleasant 70 year old female patient with above history of stage II left-sided breast cancer currently on adjuvant AI since April 2016 is here for follow-up.   Denies any significant hot flashes. Denies any joint pains. No lumps or bumps. No headaches or bone pain.  REVIEW OF SYSTEMS:  A complete 10 point review of system is done which is negative except mentioned above/history of present illness.   PAST MEDICAL HISTORY :  Past Medical History:  Diagnosis Date  . Breast cancer ([place]) 2015   left - radiation treatment  . Hypertension     PAST SURGICAL HISTORY :   Past Surgical History:  Procedure Laterality Date  . ABDOMINAL HYSTERECTOMY    . BREAST BIOPSY Right    Stereo biopsy  . BREAST LUMPECTOMY Left 04/03/2014 & 04/27/2014  . COMBINED HYSTEROSCOPY DIAGNOSTIC / D&C    . PARTIAL MASTECTOMY WITH AXILLARY SENTINEL LYMPH NODE BIOPSY Left     FAMILY HISTORY :  No family history on file.  SOCIAL HISTORY:   Social History  Substance Use Topics  . Smoking status: Former Smoker    Packs/day: 1.50    Types: Cigarettes  . Smokeless tobacco: Never Used  . Alcohol use 1.2 oz/week    2 Glasses of wine per week    ALLERGIES:  is allergic to codone [hydrocodone]; percocet  [oxycodone-acetaminophen]; and penicillins.  MEDICATIONS:  Current Outpatient Prescriptions  Medication Sig Dispense Refill  . Calcium-Vitamin D 600-200 MG-UNIT per tablet Take by mouth.    . letrozole (FEMARA) 2.5 MG tablet Take 1 tablet (2.5 mg total) by mouth daily. 90 tablet 3  . lisinopril-hydrochlorothiazide (PRINZIDE,ZESTORETIC) 20-12.5 MG per tablet TAKE ONE TABLET BY MOUTH ONCE A DAY     No current facility-administered medications for this visit.     PHYSICAL EXAMINATION: ECOG PERFORMANCE STATUS: 0 - Asymptomatic  BP 106/71 (BP Location: Left Arm, Patient Position: Sitting)   Pulse 69   Temp 97.8 F (36.6 C) (Tympanic)   Resp 18   Wt 132 lb 9 oz (60.1 kg)   BMI 23.86 kg/m   Filed Weights   11/08/15 1053  Weight: 132 lb 9 oz (60.1 kg)    GENERAL: Well-nourished well-developed; Alert, no distress and comfortable.   Alone. EYES: no pallor or icterus OROPHARYNX: no thrush or ulceration; good dentition  NECK: supple, no masses felt LYMPH:  no palpable lymphadenopathy in the cervical, axillary or inguinal regions LUNGS: clear to auscultation and  No wheeze or crackles HEART/CVS: regular rate & rhythm and no murmurs; No lower extremity edema ABDOMEN:abdomen soft, non-tender and normal bowel sounds Musculoskeletal:no cyanosis of digits and no clubbing  PSYCH: alert & oriented x 3 with fluent speech NEURO: no focal motor/sensory deficits SKIN:  no rashes or significant  lesions Right and left BREAST exam [in the presence of nurse]- no unusual skin changes or dominant masses felt. Surgical scars noted.     LABORATORY DATA:  I have reviewed the data as listed    Component Value Date/Time   NA 139 11/08/2015 1012   K 4.3 11/08/2015 1012   K 3.9 03/29/2014 1402   CL 106 11/08/2015 1012   CO2 27 11/08/2015 1012   GLUCOSE 71 11/08/2015 1012   BUN 15 11/08/2015 1012   CREATININE 0.83 11/08/2015 1012   CREATININE 0.74 07/17/2014 1005   CALCIUM 9.8 11/08/2015 1012    PROT 7.3 11/08/2015 1012   PROT 7.4 07/17/2014 1005   ALBUMIN 4.3 11/08/2015 1012   ALBUMIN 4.4 07/17/2014 1005   AST 17 11/08/2015 1012   AST 20 07/17/2014 1005   ALT 13 (L) 11/08/2015 1012   ALT 16 07/17/2014 1005   ALKPHOS 82 11/08/2015 1012   ALKPHOS 70 07/17/2014 1005   BILITOT 0.6 11/08/2015 1012   BILITOT 0.7 07/17/2014 1005   GFRNONAA >60 11/08/2015 1012   GFRNONAA >60 07/17/2014 1005   GFRAA >60 11/08/2015 1012   GFRAA >60 07/17/2014 1005    No results found for: SPEP, UPEP  Lab Results  Component Value Date   WBC 6.1 11/08/2015   NEUTROABS 3.2 11/08/2015   HGB 14.2 11/08/2015   HCT 40.7 11/08/2015   MCV 89.8 11/08/2015   PLT 238 11/08/2015      Chemistry      Component Value Date/Time   NA 139 11/08/2015 1012   K 4.3 11/08/2015 1012   K 3.9 03/29/2014 1402   CL 106 11/08/2015 1012   CO2 27 11/08/2015 1012   BUN 15 11/08/2015 1012   CREATININE 0.83 11/08/2015 1012   CREATININE 0.74 07/17/2014 1005      Component Value Date/Time   CALCIUM 9.8 11/08/2015 1012   ALKPHOS 82 11/08/2015 1012   ALKPHOS 70 07/17/2014 1005   AST 17 11/08/2015 1012   AST 20 07/17/2014 1005   ALT 13 (L) 11/08/2015 1012   ALT 16 07/17/2014 1005   BILITOT 0.6 11/08/2015 1012   BILITOT 0.7 07/17/2014 1005       RADIOGRAPHIC STUDIES: I have personally reviewed the radiological images as listed and agreed with the findings in the report. No results found.   ASSESSMENT & PLAN:  Breast cancer of lower-inner quadrant of left female breast ([place]) # Left breast stage II mixed Ductal +lobular cancer status post lumpectomy followed by radiation. Intermediate risk Oncotype; declined chemotherapy.Started on Arimidex in April 2016. Switched over to Femara from Arimidex in October 2016- for total for 5- 10 years.   # Patient tolerating Femara very well.   # BMD- May 2016- Osteopenia. Patient continues to be in Vitamin D Twice a Day.  # Since patient is clinically doing well; she'll  follow-up with me in 6 months/labs.     Cammie Sickle, MD 11/08/2015 5:03 PM

## 2015-11-08 NOTE — Assessment & Plan Note (Signed)
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 Intermediate risk Oncotype; declined chemotherapy.Started on Arimidex in April 2016. Switched over to Femara from Arimidex in October 2016- for total for 5- 10 years.   # Patient tolerating Femara very well.   # BMD- May 2016- Osteopenia. Patient continues to be in Vitamin D Twice a Day.  # Since patient is clinically doing well; she'll follow-up with me in 6 months/labs.

## 2016-01-22 ENCOUNTER — Other Ambulatory Visit: Payer: Self-pay | Admitting: Physician Assistant

## 2016-01-22 DIAGNOSIS — Z170 Estrogen receptor positive status [ER+]: Principal | ICD-10-CM

## 2016-01-22 DIAGNOSIS — C50312 Malignant neoplasm of lower-inner quadrant of left female breast: Secondary | ICD-10-CM

## 2016-02-17 ENCOUNTER — Ambulatory Visit
Admission: RE | Admit: 2016-02-17 | Discharge: 2016-02-17 | Disposition: A | Discharge status: Home / Self Care | Payer: Medicare Other | Source: Ambulatory Visit | Attending: Physician Assistant | Admitting: Physician Assistant

## 2016-02-17 DIAGNOSIS — Z170 Estrogen receptor positive status [ER+]: Secondary | ICD-10-CM

## 2016-02-17 DIAGNOSIS — C50312 Malignant neoplasm of lower-inner quadrant of left female breast: Secondary | ICD-10-CM

## 2016-02-17 DIAGNOSIS — Z853 Personal history of malignant neoplasm of breast: Secondary | ICD-10-CM | POA: Diagnosis present

## 2016-03-30 ENCOUNTER — Other Ambulatory Visit: Payer: Self-pay | Admitting: Internal Medicine

## 2016-03-30 DIAGNOSIS — C50312 Malignant neoplasm of lower-inner quadrant of left female breast: Secondary | ICD-10-CM

## 2016-04-30 ENCOUNTER — Ambulatory Visit: Payer: Medicare Other | Admitting: Radiation Oncology

## 2016-05-11 ENCOUNTER — Inpatient Hospital Stay: Payer: Medicare Other | Attending: Internal Medicine | Admitting: Internal Medicine

## 2016-05-11 ENCOUNTER — Encounter: Payer: Self-pay | Admitting: Radiation Oncology

## 2016-05-11 ENCOUNTER — Ambulatory Visit
Admission: RE | Admit: 2016-05-11 | Discharge: 2016-05-11 | Disposition: A | Discharge status: Home / Self Care | Payer: Medicare Other | Source: Ambulatory Visit | Attending: Radiation Oncology | Admitting: Radiation Oncology

## 2016-05-11 ENCOUNTER — Inpatient Hospital Stay: Payer: Medicare Other

## 2016-05-11 VITALS — BP 138/75 | HR 76 | Temp 98.4°F | Resp 18 | Wt 136.7 lb

## 2016-05-11 VITALS — BP 138/75 | HR 76 | Temp 98.4°F | Resp 18

## 2016-05-11 DIAGNOSIS — Z9221 Personal history of antineoplastic chemotherapy: Secondary | ICD-10-CM | POA: Insufficient documentation

## 2016-05-11 DIAGNOSIS — Z170 Estrogen receptor positive status [ER+]: Secondary | ICD-10-CM | POA: Diagnosis not present

## 2016-05-11 DIAGNOSIS — Z87891 Personal history of nicotine dependence: Secondary | ICD-10-CM | POA: Diagnosis not present

## 2016-05-11 DIAGNOSIS — Z79811 Long term (current) use of aromatase inhibitors: Secondary | ICD-10-CM | POA: Insufficient documentation

## 2016-05-11 DIAGNOSIS — I10 Essential (primary) hypertension: Secondary | ICD-10-CM | POA: Diagnosis not present

## 2016-05-11 DIAGNOSIS — Z923 Personal history of irradiation: Secondary | ICD-10-CM

## 2016-05-11 DIAGNOSIS — C50312 Malignant neoplasm of lower-inner quadrant of left female breast: Secondary | ICD-10-CM

## 2016-05-11 DIAGNOSIS — Z79899 Other long term (current) drug therapy: Secondary | ICD-10-CM | POA: Diagnosis not present

## 2016-05-11 LAB — COMPREHENSIVE METABOLIC PANEL
ALBUMIN: 4.2 g/dL (ref 3.5–5.0)
ALK PHOS: 76 U/L (ref 38–126)
ALT: 16 U/L (ref 14–54)
ANION GAP: 4 — AB (ref 5–15)
AST: 21 U/L (ref 15–41)
BILIRUBIN TOTAL: 0.7 mg/dL (ref 0.3–1.2)
BUN: 16 mg/dL (ref 6–20)
CALCIUM: 9.4 mg/dL (ref 8.9–10.3)
CO2: 29 mmol/L (ref 22–32)
CREATININE: 0.82 mg/dL (ref 0.44–1.00)
Chloride: 107 mmol/L (ref 101–111)
GFR calc Af Amer: >60 mL/min (ref >60)
GFR calc non Af Amer: >60 mL/min (ref >60)
GLUCOSE: 75 mg/dL (ref 65–99)
Potassium: 4.6 mmol/L (ref 3.5–5.1)
Sodium: 140 mmol/L (ref 135–145)
TOTAL PROTEIN: 7.6 g/dL (ref 6.5–8.1)

## 2016-05-11 LAB — CBC WITH DIFFERENTIAL/PLATELET
Basophils Absolute: 0.1 10*3/uL (ref 0–0.1)
Basophils Relative: 1 %
Eosinophils Absolute: 0.4 10*3/uL (ref 0–0.7)
Eosinophils Relative: 7 %
HEMATOCRIT: 41.4 % (ref 35.0–47.0)
HEMOGLOBIN: 14.1 g/dL (ref 12.0–16.0)
LYMPHS ABS: 2 10*3/uL (ref 1.0–3.6)
Lymphocytes Relative: 33 %
MCH: 31.1 pg (ref 26.0–34.0)
MCHC: 34.2 g/dL (ref 32.0–36.0)
MCV: 91.1 fL (ref 80.0–100.0)
MONOS PCT: 9 %
Monocytes Absolute: 0.6 10*3/uL (ref 0.2–0.9)
NEUTROS ABS: 3 10*3/uL (ref 1.4–6.5)
NEUTROS PCT: 50 %
Platelets: 263 10*3/uL (ref 150–440)
RBC: 4.54 MIL/uL (ref 3.80–5.20)
RDW: 13.2 % (ref 11.5–14.5)
WBC: 6 10*3/uL (ref 3.6–11.0)

## 2016-05-11 NOTE — Assessment & Plan Note (Signed)
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 Intermediate risk Oncotype; declined chemotherapy. On Femara.   # Patient tolerating Femara very well.   # BMD- May 2016- Osteopenia. Patient continues to be in Vitamin D Twice a Day. Will get BMB prior to next visit.   # Since patient is clinically doing well; she'll follow-up with me in 6 months/labs.

## 2016-05-11 NOTE — Progress Notes (Signed)
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 Smith]]  ER/PR- >90%; Her 2 Nue- NEG; Lumpec & SLNBx Oncotype-RS=19 No Chemo; s/p RT [Dr.Crystal] April 2016- Arimidex; Stopped Oct 2016; START OCT 2016- Femara  # Hot flashes- resolved  # BMD- Osteopenia [may 2016]     Breast cancer of lower-inner quadrant of left female breast ([place])   01/21/2015 Initial Diagnosis    Breast cancer of lower-inner quadrant of left female breast [facility])        INTERVAL HISTORY:  A very pleasant 71 year old female patient with above history of stage II left-sided breast cancer currently on adjuvant AI since April 2016 is here for follow-up. She was evaluated by radiation oncology this morning.  Denies any significant hot flashes. Denies any joint pains. No lumps or bumps. No headaches or bone pain.  REVIEW OF SYSTEMS:  A complete 10 point review of system is done which is negative except mentioned above/history of present illness.   PAST MEDICAL HISTORY :  Past Medical History:  Diagnosis Date  . Breast cancer ([place]) 2015   left lumpectomy 04/05/2014 and reexcision 04/27/2014 - radiation treatment  . Hypertension     PAST SURGICAL HISTORY :   Past Surgical History:  Procedure Laterality Date  . ABDOMINAL HYSTERECTOMY    . BREAST BIOPSY Left 03/15/2014   Stereo biopsy  . BREAST LUMPECTOMY Left 04/03/2014 & 04/27/2014  . COMBINED HYSTEROSCOPY DIAGNOSTIC / D&C    . PARTIAL MASTECTOMY WITH AXILLARY SENTINEL LYMPH NODE BIOPSY Left     FAMILY HISTORY :  No family history on file.  SOCIAL HISTORY:   Social History  Substance Use Topics  . Smoking status: Former Smoker    Packs/day: 1.50    Types: Cigarettes  . Smokeless tobacco: Never Used  . Alcohol use 1.2 oz/week    2  Glasses of wine per week    ALLERGIES:  is allergic to codone [hydrocodone]; percocet [oxycodone-acetaminophen]; and penicillins.  MEDICATIONS:  Current Outpatient Prescriptions  Medication Sig Dispense Refill  . Calcium-Vitamin D 600-200 MG-UNIT per tablet Take by mouth.    . letrozole (FEMARA) 2.5 MG tablet Take 2.5 mg by mouth daily.    Marland Kitchen lisinopril-hydrochlorothiazide (PRINZIDE,ZESTORETIC) 20-12.5 MG per tablet TAKE ONE TABLET BY MOUTH ONCE A DAY     No current facility-administered medications for this visit.     PHYSICAL EXAMINATION: ECOG PERFORMANCE STATUS: 0 - Asymptomatic  BP 138/75   Pulse 76   Temp 98.4 F (36.9 C) Comment: vitals taken in radiation therapy  Resp 18   There were no vitals filed for this visit.  GENERAL: Well-nourished well-developed; Alert, no distress and comfortable.   Alone. EYES: no pallor or icterus OROPHARYNX: no thrush or ulceration; good dentition  NECK: supple, no masses felt LYMPH:  no palpable lymphadenopathy in the cervical, axillary or inguinal regions LUNGS: clear to auscultation and  No wheeze or crackles HEART/CVS: regular rate & rhythm and no murmurs; No lower extremity edema ABDOMEN:abdomen soft, non-tender and normal bowel sounds Musculoskeletal:no cyanosis of digits and no clubbing  PSYCH: alert & oriented x 3 with fluent speech NEURO: no focal motor/sensory deficits SKIN:  no rashes or significant lesions Breast exam deferred as she had exam with radiation oncology this AM.  LABORATORY DATA:  I have reviewed the data as listed    Component Value Date/Time   NA 140 05/11/2016 1035   K 4.6 05/11/2016 1035   K 3.9 03/29/2014 1402   CL 107 05/11/2016 1035   CO2 29 05/11/2016 1035   GLUCOSE 75 05/11/2016 1035   BUN 16 05/11/2016 1035   CREATININE 0.82 05/11/2016 1035   CREATININE 0.74 07/17/2014 1005   CALCIUM 9.4 05/11/2016 1035   PROT 7.6 05/11/2016 1035   PROT 7.4 07/17/2014 1005   ALBUMIN 4.2 05/11/2016 1035    ALBUMIN 4.4 07/17/2014 1005   AST 21 05/11/2016 1035   AST 20 07/17/2014 1005   ALT 16 05/11/2016 1035   ALT 16 07/17/2014 1005   ALKPHOS 76 05/11/2016 1035   ALKPHOS 70 07/17/2014 1005   BILITOT 0.7 05/11/2016 1035   BILITOT 0.7 07/17/2014 1005   GFRNONAA >60 05/11/2016 1035   GFRNONAA >60 07/17/2014 1005   GFRAA >60 05/11/2016 1035   GFRAA >60 07/17/2014 1005    No results found for: SPEP, UPEP  Lab Results  Component Value Date   WBC 6.0 05/11/2016   NEUTROABS 3.0 05/11/2016   HGB 14.1 05/11/2016   HCT 41.4 05/11/2016   MCV 91.1 05/11/2016   PLT 263 05/11/2016      Chemistry      Component Value Date/Time   NA 140 05/11/2016 1035   K 4.6 05/11/2016 1035   K 3.9 03/29/2014 1402   CL 107 05/11/2016 1035   CO2 29 05/11/2016 1035   BUN 16 05/11/2016 1035   CREATININE 0.82 05/11/2016 1035   CREATININE 0.74 07/17/2014 1005      Component Value Date/Time   CALCIUM 9.4 05/11/2016 1035   ALKPHOS 76 05/11/2016 1035   ALKPHOS 70 07/17/2014 1005   AST 21 05/11/2016 1035   AST 20 07/17/2014 1005   ALT 16 05/11/2016 1035   ALT 16 07/17/2014 1005   BILITOT 0.7 05/11/2016 1035   BILITOT 0.7 07/17/2014 1005       RADIOGRAPHIC STUDIES: I have personally reviewed the radiological images as listed and agreed with the findings in the report. No results found.   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 Intermediate risk Oncotype; declined chemotherapy. On Femara.   # Patient tolerating Femara very well.   # BMD- May 2016- Osteopenia. Patient continues to be in Vitamin D Twice a Day. Will get BMB prior to next visit.   # Since patient is clinically doing well; she'll follow-up with me in 6 months/labs.     Cammie Sickle, MD 05/12/2016 9:43 AM

## 2016-05-11 NOTE — Progress Notes (Signed)
Patient here for follow-up breast cancer. Patient taking letrozole and is tolerating the drug without difficulty. She also saw Dr. Massie Maroon this morning.

## 2016-05-11 NOTE — Progress Notes (Signed)
Radiation Oncology Follow up Note  Name: Dana Snow   Date:   05/11/2016 MRN:  [id number] DOB: 1945/07/02    This 71 y.o. 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  REFERRING PROVIDER: [name], MD  HPI: Patient is a 71 year old female now out a year and half having completed whole breast radiation to her left breast for ER/PR positive HER-2/neu negative invasive lobular carcinoma seen today in routine follow-up she is doing well. She specifically denies breast tenderness cough or bone pain.Marland Kitchen Her last mammogram was in November 2017 BI-RADS 1 negative. She is currently on Femara tolerating that well without side effect.  COMPLICATIONS OF TREATMENT: none  FOLLOW UP COMPLIANCE: keeps appointments   PHYSICAL EXAM:  BP 138/75   Pulse 76   Temp 98.4 F (36.9 C)   Resp 18   Wt 136 lb 11 oz (62 kg)   BMI 24.60 kg/m  Lungs are clear to A&P cardiac examination essentially unremarkable with regular rate and rhythm. No dominant mass or nodularity is noted in either breast in 2 positions examined. Incision is well-healed. No axillary or supraclavicular adenopathy is appreciated. Cosmetic result is excellent. Well-developed well-nourished patient in NAD. HEENT reveals PERLA, EOMI, discs not visualized.  Oral cavity is clear. No oral mucosal lesions are identified. Neck is clear without evidence of cervical or supraclavicular adenopathy. Lungs are clear to A&P. Cardiac examination is essentially unremarkable with regular rate and rhythm without murmur rub or thrill. Abdomen is benign with no organomegaly or masses noted. Motor sensory and DTR levels are equal and symmetric in the upper and lower extremities. Cranial nerves II through XII are grossly intact. Proprioception is intact. No peripheral adenopathy or edema is identified. No motor or sensory levels are noted. Crude visual  fields are within normal range.  RADIOLOGY RESULTS: Most recent mammograms are reviewed and compatible with the above-stated findings.  PLAN: At the present time she is doing well with no evidence of disease. I've asked to see her back in 1 year for follow-up. She or he has follow-up mammograms scheduled for November. She continues on Femara without side effect. Patient knows to call with any concerns.  I would like to take this opportunity to thank you for allowing me to participate in the care of your patient.Armstead Peaks., MD

## 2016-05-12 LAB — CANCER ANTIGEN 27.29: CA 27.29: 30.7 U/mL (ref 0.0–38.6)

## 2016-10-30 ENCOUNTER — Telehealth: Payer: Self-pay | Admitting: Internal Medicine

## 2016-11-02 ENCOUNTER — Ambulatory Visit
Admission: RE | Admit: 2016-11-02 | Discharge: 2016-11-02 | Disposition: A | Discharge status: Home / Self Care | Payer: Medicare Other | Source: Ambulatory Visit | Attending: Internal Medicine | Admitting: Internal Medicine

## 2016-11-02 DIAGNOSIS — M85851 Other specified disorders of bone density and structure, right thigh: Secondary | ICD-10-CM | POA: Diagnosis not present

## 2016-11-02 DIAGNOSIS — C50312 Malignant neoplasm of lower-inner quadrant of left female breast: Secondary | ICD-10-CM | POA: Insufficient documentation

## 2016-11-02 DIAGNOSIS — Z79811 Long term (current) use of aromatase inhibitors: Secondary | ICD-10-CM | POA: Insufficient documentation

## 2016-11-02 DIAGNOSIS — Z170 Estrogen receptor positive status [ER+]: Secondary | ICD-10-CM | POA: Insufficient documentation

## 2016-11-09 ENCOUNTER — Ambulatory Visit: Payer: Medicare Other | Admitting: Internal Medicine

## 2016-11-09 ENCOUNTER — Other Ambulatory Visit: Payer: Medicare Other

## 2016-11-27 ENCOUNTER — Inpatient Hospital Stay (HOSPITAL_BASED_OUTPATIENT_CLINIC_OR_DEPARTMENT_OTHER): Payer: Medicare Other | Admitting: Internal Medicine

## 2016-11-27 ENCOUNTER — Inpatient Hospital Stay: Payer: Medicare Other | Attending: Internal Medicine

## 2016-11-27 ENCOUNTER — Other Ambulatory Visit: Payer: Self-pay

## 2016-11-27 VITALS — BP 125/75 | HR 68 | Resp 16 | Wt 132.0 lb

## 2016-11-27 DIAGNOSIS — Z87891 Personal history of nicotine dependence: Secondary | ICD-10-CM | POA: Diagnosis not present

## 2016-11-27 DIAGNOSIS — Z79811 Long term (current) use of aromatase inhibitors: Secondary | ICD-10-CM | POA: Insufficient documentation

## 2016-11-27 DIAGNOSIS — Z170 Estrogen receptor positive status [ER+]: Secondary | ICD-10-CM

## 2016-11-27 DIAGNOSIS — I10 Essential (primary) hypertension: Secondary | ICD-10-CM

## 2016-11-27 DIAGNOSIS — C50312 Malignant neoplasm of lower-inner quadrant of left female breast: Secondary | ICD-10-CM

## 2016-11-27 DIAGNOSIS — M8580 Other specified disorders of bone density and structure, unspecified site: Secondary | ICD-10-CM | POA: Diagnosis not present

## 2016-11-27 LAB — COMPREHENSIVE METABOLIC PANEL
ALBUMIN: 4.4 g/dL (ref 3.5–5.0)
ALK PHOS: 76 U/L (ref 38–126)
ALT: 17 U/L (ref 14–54)
ANION GAP: 8 (ref 5–15)
AST: 20 U/L (ref 15–41)
BUN: 18 mg/dL (ref 6–20)
CHLORIDE: 100 mmol/L — AB (ref 101–111)
CO2: 28 mmol/L (ref 22–32)
Calcium: 10 mg/dL (ref 8.9–10.3)
Creatinine, Ser: 0.81 mg/dL (ref 0.44–1.00)
GFR calc Af Amer: >60 mL/min (ref >60)
GFR calc non Af Amer: >60 mL/min (ref >60)
GLUCOSE: 104 mg/dL — AB (ref 65–99)
POTASSIUM: 4.8 mmol/L (ref 3.5–5.1)
SODIUM: 136 mmol/L (ref 135–145)
Total Bilirubin: 0.6 mg/dL (ref 0.3–1.2)
Total Protein: 7.6 g/dL (ref 6.5–8.1)

## 2016-11-27 LAB — CBC WITH DIFFERENTIAL/PLATELET
BASOS PCT: 1 %
Basophils Absolute: 0.1 10*3/uL (ref 0–0.1)
EOS ABS: 0.3 10*3/uL (ref 0–0.7)
EOS PCT: 4 %
HCT: 42 % (ref 35.0–47.0)
HEMOGLOBIN: 14.4 g/dL (ref 12.0–16.0)
Lymphocytes Relative: 31 %
Lymphs Abs: 2 10*3/uL (ref 1.0–3.6)
MCH: 31.3 pg (ref 26.0–34.0)
MCHC: 34.4 g/dL (ref 32.0–36.0)
MCV: 90.9 fL (ref 80.0–100.0)
MONOS PCT: 8 %
Monocytes Absolute: 0.5 10*3/uL (ref 0.2–0.9)
NEUTROS PCT: 56 %
Neutro Abs: 3.6 10*3/uL (ref 1.4–6.5)
PLATELETS: 264 10*3/uL (ref 150–440)
RBC: 4.62 MIL/uL (ref 3.80–5.20)
RDW: 13.4 % (ref 11.5–14.5)
WBC: 6.5 10*3/uL (ref 3.6–11.0)

## 2016-11-27 NOTE — Progress Notes (Signed)
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 Smith]]  ER/PR- >90%; Her 2 Nue- NEG; Lumpec & SLNBx Oncotype-RS=19 No Chemo; s/p RT [Dr.Crystal] April 2016- Arimidex; Stopped Oct 2016; START OCT 2016- Femara  # Hot flashes- resolved  # BMD- Osteopenia [may 2016]; AUG 2018- T score= -1.1 [improved]     Carcinoma of lower-inner quadrant of left breast in female, estrogen receptor positive ([place])     INTERVAL HISTORY:  A very pleasant 71 year old female patient with above history of stage II left-sided breast cancer currently on adjuvant AI since April 2016 is here for follow-up; Review the results of the BMD.   Denies any significant hot flashes. Denies any joint pains. No lumps or bumps. No headaches or bone pain. Patient continues to be an letrozole.   REVIEW OF SYSTEMS:  A complete 10 point review of system is done which is negative except mentioned above/history of present illness.   PAST MEDICAL HISTORY :  Past Medical History:  Diagnosis Date  . Breast cancer ([place]) 2015   left lumpectomy 04/05/2014 and reexcision 04/27/2014 - radiation treatment  . Hypertension     PAST SURGICAL HISTORY :   Past Surgical History:  Procedure Laterality Date  . ABDOMINAL HYSTERECTOMY    . BREAST BIOPSY Left 03/15/2014   Stereo biopsy  . BREAST LUMPECTOMY Left 04/03/2014 & 04/27/2014  . COMBINED HYSTEROSCOPY DIAGNOSTIC / D&C    . PARTIAL MASTECTOMY WITH AXILLARY SENTINEL LYMPH NODE BIOPSY Left     FAMILY HISTORY :  No family history on file.  SOCIAL HISTORY:   Social History  Substance Use Topics  . Smoking status: Former Smoker    Packs/day: 1.50    Types: Cigarettes  . Smokeless tobacco: Never Used  . Alcohol use 1.2 oz/week    2 Glasses of wine per week     ALLERGIES:  is allergic to codone [hydrocodone]; percocet [oxycodone-acetaminophen]; and penicillins.  MEDICATIONS:  Current Outpatient Prescriptions  Medication Sig Dispense Refill  . Calcium-Vitamin D 600-200 MG-UNIT per tablet Take by mouth.    . letrozole (FEMARA) 2.5 MG tablet Take 2.5 mg by mouth daily.    Marland Kitchen lisinopril-hydrochlorothiazide (PRINZIDE,ZESTORETIC) 20-12.5 MG per tablet TAKE ONE TABLET BY MOUTH ONCE A DAY     No current facility-administered medications for this visit.     PHYSICAL EXAMINATION: ECOG PERFORMANCE STATUS: 0 - Asymptomatic  BP 125/75 (BP Location: Left Arm, Patient Position: Sitting)   Pulse 68   Resp 16   Wt 132 lb (59.9 kg)   BMI 23.76 kg/m   Filed Weights   11/27/16 1133 11/27/16 1134  Weight: 132 lb (59.9 kg) 132 lb (59.9 kg)    GENERAL: Well-nourished well-developed; Alert, no distress and comfortable.   Alone. EYES: no pallor or icterus OROPHARYNX: no thrush or ulceration; good dentition  NECK: supple, no masses felt LYMPH:  no palpable lymphadenopathy in the cervical, axillary or inguinal regions LUNGS: clear to auscultation and  No wheeze or crackles HEART/CVS: regular rate & rhythm and no murmurs; No lower extremity edema ABDOMEN:abdomen soft, non-tender and normal bowel sounds Musculoskeletal:no cyanosis of digits and no clubbing  PSYCH: alert & oriented x 3 with fluent speech NEURO: no focal motor/sensory deficits SKIN:  no rashes or significant lesions  LABORATORY DATA:  I have reviewed the data as listed    Component Value Date/Time   NA 136 11/27/2016 1110   K 4.8 11/27/2016 1110   K 3.9 03/29/2014 1402   CL 100 (L) 11/27/2016 1110   CO2 28 11/27/2016 1110   GLUCOSE 104 (H) 11/27/2016 1110   BUN 18 11/27/2016 1110   CREATININE 0.81 11/27/2016 1110   CREATININE 0.74 07/17/2014 1005   CALCIUM 10.0 11/27/2016 1110   PROT 7.6 11/27/2016 1110   PROT 7.4 07/17/2014 1005   ALBUMIN 4.4 11/27/2016 1110   ALBUMIN 4.4  07/17/2014 1005   AST 20 11/27/2016 1110   AST 20 07/17/2014 1005   ALT 17 11/27/2016 1110   ALT 16 07/17/2014 1005   ALKPHOS 76 11/27/2016 1110   ALKPHOS 70 07/17/2014 1005   BILITOT 0.6 11/27/2016 1110   BILITOT 0.7 07/17/2014 1005   GFRNONAA >60 11/27/2016 1110   GFRNONAA >60 07/17/2014 1005   GFRAA >60 11/27/2016 1110   GFRAA >60 07/17/2014 1005    No results found for: SPEP, UPEP  Lab Results  Component Value Date   WBC 6.5 11/27/2016   NEUTROABS 3.6 11/27/2016   HGB 14.4 11/27/2016   HCT 42.0 11/27/2016   MCV 90.9 11/27/2016   PLT 264 11/27/2016      Chemistry      Component Value Date/Time   NA 136 11/27/2016 1110   K 4.8 11/27/2016 1110   K 3.9 03/29/2014 1402   CL 100 (L) 11/27/2016 1110   CO2 28 11/27/2016 1110   BUN 18 11/27/2016 1110   CREATININE 0.81 11/27/2016 1110   CREATININE 0.74 07/17/2014 1005      Component Value Date/Time   CALCIUM 10.0 11/27/2016 1110   ALKPHOS 76 11/27/2016 1110   ALKPHOS 70 07/17/2014 1005   AST 20 11/27/2016 1110   AST 20 07/17/2014 1005   ALT 17 11/27/2016 1110   ALT 16 07/17/2014 1005   BILITOT 0.6 11/27/2016 1110   BILITOT 0.7 07/17/2014 1005       RADIOGRAPHIC STUDIES: I have personally reviewed the radiological images as listed and agreed with the findings in the report. No results found.   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 Intermediate risk Oncotype; declined chemotherapy. On Femara.   # Patient tolerating Femara very well. Continue Femara.  # BMD- AUG 2018 Osteopenia. T score= -1.1/improved. Patient continues to be in Vitamin D Twice a Day. Reviewed with pt in detail. She has been excersing/lifting weights.   # She'll follow-up with me in 6 months/labs.[facility]- Nov 2018; Dr.McLaughlin. ]     Cammie Sickle, MD 11/29/2016  6:28 AM

## 2016-11-27 NOTE — Assessment & Plan Note (Addendum)
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 Intermediate risk Oncotype; declined chemotherapy. On Femara.   # Patient tolerating Femara very well. Continue Femara.  # BMD- AUG 2018 Osteopenia. T score= -1.1/improved. Patient continues to be in Vitamin D Twice a Day. Reviewed with pt in detail. She has been excersing/lifting weights.   # She'll follow-up with me in 6 months/labs.[mammo- Nov 2018; Dr.McLaughlin. ]

## 2017-02-10 ENCOUNTER — Other Ambulatory Visit: Payer: Self-pay | Admitting: Physician Assistant

## 2017-02-10 DIAGNOSIS — Z853 Personal history of malignant neoplasm of breast: Secondary | ICD-10-CM

## 2017-04-02 ENCOUNTER — Ambulatory Visit
Admission: RE | Admit: 2017-04-02 | Discharge: 2017-04-02 | Disposition: A | Discharge status: Home / Self Care | Payer: Medicare Other | Source: Ambulatory Visit | Attending: Physician Assistant | Admitting: Physician Assistant

## 2017-04-02 DIAGNOSIS — Z853 Personal history of malignant neoplasm of breast: Secondary | ICD-10-CM | POA: Diagnosis not present

## 2017-04-22 ENCOUNTER — Other Ambulatory Visit: Payer: Self-pay | Admitting: Internal Medicine

## 2017-04-22 DIAGNOSIS — Z79811 Long term (current) use of aromatase inhibitors: Secondary | ICD-10-CM

## 2017-04-22 DIAGNOSIS — C50312 Malignant neoplasm of lower-inner quadrant of left female breast: Secondary | ICD-10-CM

## 2017-04-22 DIAGNOSIS — Z170 Estrogen receptor positive status [ER+]: Principal | ICD-10-CM

## 2017-05-10 ENCOUNTER — Other Ambulatory Visit: Payer: Self-pay

## 2017-05-10 ENCOUNTER — Encounter: Payer: Self-pay | Admitting: Radiation Oncology

## 2017-05-10 ENCOUNTER — Ambulatory Visit
Admission: RE | Admit: 2017-05-10 | Discharge: 2017-05-10 | Disposition: A | Discharge status: Home / Self Care | Payer: Medicare Other | Source: Ambulatory Visit | Attending: Radiation Oncology | Admitting: Radiation Oncology

## 2017-05-10 VITALS — BP 132/72 | HR 80 | Temp 98.0°F | Resp 18 | Wt 136.6 lb

## 2017-05-10 DIAGNOSIS — Z79811 Long term (current) use of aromatase inhibitors: Secondary | ICD-10-CM | POA: Insufficient documentation

## 2017-05-10 DIAGNOSIS — Z923 Personal history of irradiation: Secondary | ICD-10-CM | POA: Diagnosis not present

## 2017-05-10 DIAGNOSIS — C50312 Malignant neoplasm of lower-inner quadrant of left female breast: Secondary | ICD-10-CM

## 2017-05-10 DIAGNOSIS — Z170 Estrogen receptor positive status [ER+]: Secondary | ICD-10-CM | POA: Insufficient documentation

## 2017-05-10 NOTE — Progress Notes (Signed)
Radiation Oncology Follow up Note  Name: Dana Snow   Date:   05/10/2017 MRN:  [id number] DOB: 11/03/1945    This 72 y.o. female presents to the clinic today for 2.5 year follow-up status post whole breast radiation to her left breast for stage IIa lobular carcinoma.  REFERRING PROVIDER: McLaughlin, Miriam K, MD  HPI: Patient is a 72-year-old female now out 2.5 years having completed whole breast radiation to her left breast for ER/PR positive HER-2/neu negative invasive lobular carcinoma. She seen today in routine follow-up and is doing well. She specifically denies breast tenderness cough or bone pain.. She is currently on letrozole tolerating that well without side effect. She had bilateral tomograms back in November which I have reviewed which were BI-RADS 1 benign  COMPLICATIONS OF TREATMENT: none  FOLLOW UP COMPLIANCE: keeps appointments   PHYSICAL EXAM:  BP 132/72   Pulse 80   Temp 98 F (36.7 C)   Resp 18   Wt 136 lb 9.2 oz (62 kg)   BMI 24.58 kg/m  Lungs are clear to A&P cardiac examination essentially unremarkable with regular rate and rhythm. No dominant mass or nodularity is noted in either breast in 2 positions examined. Incision is well-healed. No axillary or supraclavicular adenopathy is appreciated. Cosmetic result is excellent. Well-developed well-nourished patient in NAD. HEENT reveals PERLA, EOMI, discs not visualized.  Oral cavity is clear. No oral mucosal lesions are identified. Neck is clear without evidence of cervical or supraclavicular adenopathy. Lungs are clear to A&P. Cardiac examination is essentially unremarkable with regular rate and rhythm without murmur rub or thrill. Abdomen is benign with no organomegaly or masses noted. Motor sensory and DTR levels are equal and symmetric in the upper and lower extremities. Cranial nerves II through XII are grossly intact. Proprioception is intact. No peripheral adenopathy or edema is identified. No motor or sensory  levels are noted. Crude visual fields are within normal range.  RADIOLOGY RESULTS: Mammograms are reviewed and compatible above-stated findings  PLAN: Present time patient is doing well with no evidence of disease. She continues on letrozole without side effect. She or he has follow-up mammograms ordered for next year. I'm please were overall progress will see her back in 1 year for follow-up. She knows to call sooner with any concerns.  I would like to take this opportunity to thank you for allowing me to participate in the care of your patient..    Glenn Chrystal, MD   

## 2017-05-28 ENCOUNTER — Inpatient Hospital Stay: Payer: Medicare Other | Attending: Internal Medicine

## 2017-05-28 ENCOUNTER — Other Ambulatory Visit: Payer: Self-pay

## 2017-05-28 ENCOUNTER — Inpatient Hospital Stay: Payer: Medicare Other | Admitting: Internal Medicine

## 2017-05-28 ENCOUNTER — Encounter: Payer: Self-pay | Admitting: Internal Medicine

## 2017-05-28 VITALS — BP 110/69 | HR 70 | Temp 98.0°F | Resp 20 | Ht 62.5 in | Wt 138.0 lb

## 2017-05-28 DIAGNOSIS — C50312 Malignant neoplasm of lower-inner quadrant of left female breast: Secondary | ICD-10-CM

## 2017-05-28 DIAGNOSIS — Z79811 Long term (current) use of aromatase inhibitors: Secondary | ICD-10-CM | POA: Insufficient documentation

## 2017-05-28 DIAGNOSIS — Z170 Estrogen receptor positive status [ER+]: Secondary | ICD-10-CM

## 2017-05-28 DIAGNOSIS — Z923 Personal history of irradiation: Secondary | ICD-10-CM

## 2017-05-28 DIAGNOSIS — M8580 Other specified disorders of bone density and structure, unspecified site: Secondary | ICD-10-CM | POA: Insufficient documentation

## 2017-05-28 DIAGNOSIS — Z87891 Personal history of nicotine dependence: Secondary | ICD-10-CM | POA: Diagnosis not present

## 2017-05-28 LAB — CBC WITH DIFFERENTIAL/PLATELET
Basophils Absolute: 0.1 10*3/uL (ref 0–0.1)
Basophils Relative: 1 %
EOS PCT: 4 %
Eosinophils Absolute: 0.3 10*3/uL (ref 0–0.7)
HEMATOCRIT: 42.8 % (ref 35.0–47.0)
Hemoglobin: 14.6 g/dL (ref 12.0–16.0)
LYMPHS ABS: 2.2 10*3/uL (ref 1.0–3.6)
LYMPHS PCT: 33 %
MCH: 31.3 pg (ref 26.0–34.0)
MCHC: 34.1 g/dL (ref 32.0–36.0)
MCV: 91.8 fL (ref 80.0–100.0)
Monocytes Absolute: 0.6 10*3/uL (ref 0.2–0.9)
Monocytes Relative: 8 %
NEUTROS ABS: 3.6 10*3/uL (ref 1.4–6.5)
Neutrophils Relative %: 54 %
PLATELETS: 257 10*3/uL (ref 150–440)
RBC: 4.66 MIL/uL (ref 3.80–5.20)
RDW: 13.3 % (ref 11.5–14.5)
WBC: 6.7 10*3/uL (ref 3.6–11.0)

## 2017-05-28 LAB — COMPREHENSIVE METABOLIC PANEL
ALBUMIN: 4.1 g/dL (ref 3.5–5.0)
ALT: 16 U/L (ref 14–54)
ANION GAP: 7 (ref 5–15)
AST: 20 U/L (ref 15–41)
Alkaline Phosphatase: 73 U/L (ref 38–126)
BUN: 18 mg/dL (ref 6–20)
CHLORIDE: 103 mmol/L (ref 101–111)
CO2: 28 mmol/L (ref 22–32)
Calcium: 10 mg/dL (ref 8.9–10.3)
Creatinine, Ser: 0.8 mg/dL (ref 0.44–1.00)
GFR calc Af Amer: >60 mL/min (ref >60)
GLUCOSE: 97 mg/dL (ref 65–99)
POTASSIUM: 4.8 mmol/L (ref 3.5–5.1)
Sodium: 138 mmol/L (ref 135–145)
Total Bilirubin: 0.6 mg/dL (ref 0.3–1.2)
Total Protein: 7.6 g/dL (ref 6.5–8.1)

## 2017-05-28 MED ORDER — LETROZOLE 2.5 MG PO TABS: 2.5000 mg | ORAL_TABLET | Freq: Every day | ORAL | 3 refills | Status: DC

## 2017-05-28 NOTE — Assessment & Plan Note (Addendum)
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 Intermediate risk Oncotype; declined chemotherapy. On Femara.   # Patient tolerating Femara very well. Continue Femara.  [facility] 2019 mammogram negative.  # BMD- AUG 2018 Osteopenia. T score= -1.1/improved. Patient continues to be in Vitamin D Twice a Day. On excersing /lifting weights.   # follow up in 12 months/labs [mammo-thru PCP].

## 2017-05-28 NOTE — Progress Notes (Signed)
Patient here for follow-up for breast cancer. Her last mammogram was scheduled by pcp in January. She has no new breast concerns. She states that Dr. Baruch Gouty performed a breast exam approx. 1 month ago and the exam was wnl. She states that she prefers not to have a breast exam today if possible.

## 2017-05-28 NOTE — Progress Notes (Signed)
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 Smith]]  ER/PR- >90%; Her 2 Nue- NEG; Lumpec & SLNBx Oncotype-RS=19 No Chemo; s/p RT [Dr.Crystal] April 2016- Arimidex; Stopped Oct 2016; START OCT 2016- Femara  # Hot flashes- resolved  # BMD- Osteopenia [may 2016]; AUG 2018- T score= -1.1 [improved]     Carcinoma of lower-inner quadrant of left breast in female, estrogen receptor positive ([place])     INTERVAL HISTORY:  A very pleasant 72 year old female patient with above history of stage II left-sided breast cancer currently on adjuvant AI since April 2016 is here for follow-up.  Patient rarely has hot flashes.  Denies any joint pains. No lumps or bumps. No headaches or bone pain. Patient continues to be an letrozole.   Recently evaluated by PCP; and also by radiation oncology.  REVIEW OF SYSTEMS:  A complete 10 point review of system is done which is negative except mentioned above/history of present illness.   PAST MEDICAL HISTORY :  Past Medical History:  Diagnosis Date  . Breast cancer ([place]) 2015   left lumpectomy 04/05/2014 and reexcision 04/27/2014 - radiation treatment  . Hypertension     PAST SURGICAL HISTORY :   Past Surgical History:  Procedure Laterality Date  . ABDOMINAL HYSTERECTOMY    . BREAST BIOPSY Left 03/15/2014   Stereo biopsy  . BREAST LUMPECTOMY Left 04/03/2014 & 04/27/2014   INVASIVE MAMMARY CARCINOMA WITH MIXED DUCTAL AND LOBULAR FEATURES.   . COMBINED HYSTEROSCOPY DIAGNOSTIC / D&C    . PARTIAL MASTECTOMY WITH AXILLARY SENTINEL LYMPH NODE BIOPSY Left     FAMILY HISTORY :   Family History  Problem Relation Age of Onset  . Breast cancer Neg Hx     SOCIAL HISTORY:   Social History   Tobacco Use  . Smoking status: Former  Smoker    Packs/day: 1.50    Types: Cigarettes  . Smokeless tobacco: Never Used  Substance Use Topics  . Alcohol use: Yes    Alcohol/week: 1.2 oz    Types: 2 Glasses of wine per week  . Drug use: Not on file    ALLERGIES:  is allergic to codone [hydrocodone]; percocet [oxycodone-acetaminophen]; and penicillins.  MEDICATIONS:  Current Outpatient Medications  Medication Sig Dispense Refill  . Calcium-Vitamin D 600-200 MG-UNIT per tablet Take by mouth.    . letrozole (FEMARA) 2.5 MG tablet Take 1 tablet (2.5 mg total) by mouth daily. 90 tablet 3  . lisinopril-hydrochlorothiazide (PRINZIDE,ZESTORETIC) 20-12.5 MG per tablet TAKE ONE TABLET BY MOUTH ONCE A DAY     No current facility-administered medications for this visit.     PHYSICAL EXAMINATION: ECOG PERFORMANCE STATUS: 0 - Asymptomatic  BP 110/69   Pulse 70   Temp 98 F (36.7 C) (Tympanic)   Resp 20   Ht 5' 2.5" (1.588 m)   Wt 138 lb (62.6 kg)   BMI 24.84 kg/m   Filed Weights   05/28/17 1117  Weight: 138 lb (62.6 kg)    GENERAL: Well-nourished well-developed; Alert, no distress and comfortable.   Alone. EYES: no pallor or icterus OROPHARYNX: no thrush or ulceration; good dentition  NECK: supple, no masses felt LYMPH:  no palpable lymphadenopathy in the cervical, axillary or inguinal regions LUNGS: clear to auscultation and  No wheeze  or crackles HEART/CVS: regular rate & rhythm and no murmurs; No lower extremity edema ABDOMEN:abdomen soft, non-tender and normal bowel sounds Musculoskeletal:no cyanosis of digits and no clubbing  PSYCH: alert & oriented x 3 with fluent speech NEURO: no focal motor/sensory deficits SKIN:  no rashes or significant lesions; deferred at patient's request [recent mammogram/radiation oncology negative]   LABORATORY DATA:  I have reviewed the data as listed    Component Value Date/Time   NA 138 05/28/2017 1045   K 4.8 05/28/2017 1045   K 3.9 03/29/2014 1402   CL 103 05/28/2017 1045    CO2 28 05/28/2017 1045   GLUCOSE 97 05/28/2017 1045   BUN 18 05/28/2017 1045   CREATININE 0.80 05/28/2017 1045   CREATININE 0.74 07/17/2014 1005   CALCIUM 10.0 05/28/2017 1045   PROT 7.6 05/28/2017 1045   PROT 7.4 07/17/2014 1005   ALBUMIN 4.1 05/28/2017 1045   ALBUMIN 4.4 07/17/2014 1005   AST 20 05/28/2017 1045   AST 20 07/17/2014 1005   ALT 16 05/28/2017 1045   ALT 16 07/17/2014 1005   ALKPHOS 73 05/28/2017 1045   ALKPHOS 70 07/17/2014 1005   BILITOT 0.6 05/28/2017 1045   BILITOT 0.7 07/17/2014 1005   GFRNONAA >60 05/28/2017 1045   GFRNONAA >60 07/17/2014 1005   GFRAA >60 05/28/2017 1045   GFRAA >60 07/17/2014 1005    No results found for: SPEP, UPEP  Lab Results  Component Value Date   WBC 6.7 05/28/2017   NEUTROABS 3.6 05/28/2017   HGB 14.6 05/28/2017   HCT 42.8 05/28/2017   MCV 91.8 05/28/2017   PLT 257 05/28/2017      Chemistry      Component Value Date/Time   NA 138 05/28/2017 1045   K 4.8 05/28/2017 1045   K 3.9 03/29/2014 1402   CL 103 05/28/2017 1045   CO2 28 05/28/2017 1045   BUN 18 05/28/2017 1045   CREATININE 0.80 05/28/2017 1045   CREATININE 0.74 07/17/2014 1005      Component Value Date/Time   CALCIUM 10.0 05/28/2017 1045   ALKPHOS 73 05/28/2017 1045   ALKPHOS 70 07/17/2014 1005   AST 20 05/28/2017 1045   AST 20 07/17/2014 1005   ALT 16 05/28/2017 1045   ALT 16 07/17/2014 1005   BILITOT 0.6 05/28/2017 1045   BILITOT 0.7 07/17/2014 1005       RADIOGRAPHIC STUDIES: I have personally reviewed the radiological images as listed and agreed with the findings in the report. No results found.   ASSESSMENT & PLAN:  Carcinoma of lower-inner quadrant of left breast in female, estrogen receptor positive ([place]) # Left breast stage II mixed Ductal +lobular cancer status post lumpectomy followed by radiation. Intermediate risk Oncotype; declined chemotherapy. On Femara.   # Patient tolerating Femara very well. Continue Femara.  [facility] 2019  mammogram negative.  # BMD- AUG 2018 Osteopenia. T score= -1.1/improved. Patient continues to be in Vitamin D Twice a Day. On excersing /lifting weights.   # follow up in 12 months/labs [mammo-thru PCP].      Cammie Sickle, MD 05/28/2017 11:47 AM

## 2017-05-29 LAB — CANCER ANTIGEN 27.29: CA 27.29: 38.4 U/mL (ref 0.0–38.6)

## 2017-11-09 ENCOUNTER — Telehealth: Payer: Self-pay | Admitting: *Deleted

## 2017-11-09 NOTE — Telephone Encounter (Signed)
Dana Snow. Dr. Rogue Bussing would like patient to make an apt with the NP

## 2017-11-09 NOTE — Telephone Encounter (Signed)
Patient called thinking she is having side effects of her Letrozole, she is having increasing SHORT OF BREATH and fatigue, and hot flashes worsening for the past 4 months. Asking if she should take something different than Letrozole or be seen, as her next appointment is not until March 2020. Please advise

## 2017-11-10 NOTE — Telephone Encounter (Signed)
Patient cannot come at 1130 due to another appointment. Per Marin Comment appointment accepted for tomorrow @ [phone]

## 2017-11-10 NOTE — Telephone Encounter (Signed)
Dana Snow, she can come at 11:30 am today

## 2017-11-10 NOTE — Telephone Encounter (Signed)
Per Dr. Jacinto Reap- scheduled apt with Ander Purpura, NP

## 2017-11-10 NOTE — Telephone Encounter (Signed)
There is no Symptom Management Clinic today, will tomorrow be alright or can you work her into your schedule today?

## 2017-11-11 ENCOUNTER — Inpatient Hospital Stay: Payer: Medicare Other

## 2017-11-11 ENCOUNTER — Encounter: Payer: Self-pay | Admitting: Nurse Practitioner

## 2017-11-11 ENCOUNTER — Inpatient Hospital Stay: Payer: Medicare Other | Attending: Internal Medicine | Admitting: Nurse Practitioner

## 2017-11-11 VITALS — BP 134/76 | HR 70 | Temp 97.8°F | Resp 16 | Wt 135.0 lb

## 2017-11-11 DIAGNOSIS — C50312 Malignant neoplasm of lower-inner quadrant of left female breast: Secondary | ICD-10-CM | POA: Insufficient documentation

## 2017-11-11 DIAGNOSIS — Z79811 Long term (current) use of aromatase inhibitors: Secondary | ICD-10-CM | POA: Insufficient documentation

## 2017-11-11 DIAGNOSIS — Z170 Estrogen receptor positive status [ER+]: Secondary | ICD-10-CM | POA: Diagnosis not present

## 2017-11-11 DIAGNOSIS — R5383 Other fatigue: Secondary | ICD-10-CM | POA: Insufficient documentation

## 2017-11-11 DIAGNOSIS — R0602 Shortness of breath: Secondary | ICD-10-CM | POA: Insufficient documentation

## 2017-11-11 DIAGNOSIS — E559 Vitamin D deficiency, unspecified: Secondary | ICD-10-CM | POA: Insufficient documentation

## 2017-11-11 DIAGNOSIS — M8580 Other specified disorders of bone density and structure, unspecified site: Secondary | ICD-10-CM | POA: Insufficient documentation

## 2017-11-11 DIAGNOSIS — R197 Diarrhea, unspecified: Secondary | ICD-10-CM | POA: Diagnosis not present

## 2017-11-11 DIAGNOSIS — Z87891 Personal history of nicotine dependence: Secondary | ICD-10-CM | POA: Insufficient documentation

## 2017-11-11 LAB — CBC WITH DIFFERENTIAL/PLATELET
BASOS ABS: 0.1 10*3/uL (ref 0–0.1)
BASOS PCT: 1 %
EOS ABS: 0.3 10*3/uL (ref 0–0.7)
Eosinophils Relative: 5 %
HEMATOCRIT: 44 % (ref 35.0–47.0)
HEMOGLOBIN: 14.9 g/dL (ref 12.0–16.0)
Lymphocytes Relative: 35 %
Lymphs Abs: 2.1 10*3/uL (ref 1.0–3.6)
MCH: 31.3 pg (ref 26.0–34.0)
MCHC: 33.9 g/dL (ref 32.0–36.0)
MCV: 92.4 fL (ref 80.0–100.0)
MONOS PCT: 9 %
Monocytes Absolute: 0.5 10*3/uL (ref 0.2–0.9)
NEUTROS ABS: 3.1 10*3/uL (ref 1.4–6.5)
NEUTROS PCT: 50 %
Platelets: 262 10*3/uL (ref 150–440)
RBC: 4.76 MIL/uL (ref 3.80–5.20)
RDW: 13.2 % (ref 11.5–14.5)
WBC: 6 10*3/uL (ref 3.6–11.0)

## 2017-11-11 LAB — COMPREHENSIVE METABOLIC PANEL
ALBUMIN: 4.5 g/dL (ref 3.5–5.0)
ALK PHOS: 72 U/L (ref 38–126)
ALT: 16 U/L (ref 0–44)
AST: 21 U/L (ref 15–41)
Anion gap: 10 (ref 5–15)
BILIRUBIN TOTAL: 0.8 mg/dL (ref 0.3–1.2)
BUN: 14 mg/dL (ref 8–23)
CALCIUM: 10 mg/dL (ref 8.9–10.3)
CO2: 26 mmol/L (ref 22–32)
CREATININE: 0.75 mg/dL (ref 0.44–1.00)
Chloride: 103 mmol/L (ref 98–111)
GFR calc Af Amer: >60 mL/min (ref >60)
GFR calc non Af Amer: >60 mL/min (ref >60)
GLUCOSE: 99 mg/dL (ref 70–99)
Potassium: 4.5 mmol/L (ref 3.5–5.1)
SODIUM: 139 mmol/L (ref 135–145)
TOTAL PROTEIN: 7.6 g/dL (ref 6.5–8.1)

## 2017-11-11 LAB — TSH: TSH: 1.626 u[IU]/mL (ref 0.350–4.500)

## 2017-11-11 NOTE — Progress Notes (Signed)
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  She states that over the past 4 months she has gradually noticed increased shortness of breath with exertion (1 flight of stairs), fatigue, hot flashes, intermittent diarrhea.  She is most concerned by the shortness of breath while stairclimbing she is very active and exercises regularly including cardio, strength training, and yoga 5 days a week. She says the symptoms do not occur all the time but that she has noticed that they have increased in frequency and she was concerned that they may be related to her cancer or to the letrozole. She was previously on Arimidex, started 06/2014 and switched to letrozole 12/2014. She cannot recall why she switched.  She also reports history of heart disease throughout both maternal and paternal sides of her family and questions if symptoms could be  related to her heart.  In November 2018 she had developed some symptoms of lightheadedness/presyncopal x 2, not related to exertion; no chest pain or palpitations at that time. Dr. Verne Spurr note reviewed which states EKG did not show acute ischemia but when compared to prior EKGs, slightly more T wave inversion noted in anterior leads.  She was referred for stress echo (02/15/2017) which revealed LV EF 55%, mild aortic valve regurg, resting and stress EF > 55%- normal.  She denies any neurologic complaints.  Denies any fevers or illnesses.  Denies any easy bleeding or bruising.  Reports good appetite and denies weight loss.  She denies chest pain.  Denies any nausea, vomiting, constipation, or diarrhea.   ECOG FS:1 - Symptomatic but completely ambulatory  Review of systems- Review of Systems  Constitutional: Positive for malaise/fatigue. Negative for chills, fever and weight loss.  HENT: Negative for congestion, ear discharge, ear pain, sinus pain, sore throat and tinnitus.   Eyes: Negative.   Respiratory: Positive for shortness of breath (w/ exertion). Negative for cough and sputum production.   Cardiovascular: Negative for chest pain, palpitations, orthopnea, claudication and leg swelling.  Gastrointestinal: Positive for diarrhea. Negative for abdominal pain, blood in stool, constipation, heartburn, nausea and vomiting.  Genitourinary: Negative.   Musculoskeletal: Negative.   Skin: Negative.   Neurological: Positive for sensory change (hot flashes) and weakness. Negative for dizziness, tingling and headaches.  Endo/Heme/Allergies: Negative.   Psychiatric/Behavioral: Negative.     Current treatment- letrozole   Allergies  Allergen Reactions  . Codone [Hydrocodone] Other (See Comments)    hyperactive  . Percocet [Oxycodone-Acetaminophen] Other (See Comments)  lupy  . Penicillins Rash    Past Medical History:  Diagnosis Date  . Breast cancer ([place]) 2015   left lumpectomy 04/05/2014  and reexcision 04/27/2014 - radiation treatment  . Hypertension     Past Surgical History:  Procedure Laterality Date  . ABDOMINAL HYSTERECTOMY    . BREAST BIOPSY Left 03/15/2014   Stereo biopsy  . BREAST LUMPECTOMY Left 04/03/2014 & 04/27/2014   INVASIVE MAMMARY CARCINOMA WITH MIXED DUCTAL AND LOBULAR FEATURES.   . COMBINED HYSTEROSCOPY DIAGNOSTIC / D&C    . PARTIAL MASTECTOMY WITH AXILLARY SENTINEL LYMPH NODE BIOPSY Left     Social History   Socioeconomic History  . Marital status: Married    Spouse name: Not on file  . Number of children: Not on file  . Years of education: Not on file  . Highest education level: Not on file  Occupational History  . Not on file  Social Needs  . Financial resource strain: Not on file  . Food insecurity:    Worry: Not on file    Inability: Not on file  . Transportation needs:    Medical: Not on file    Non-medical: Not on file  Tobacco Use  . Smoking status: Former Smoker    Packs/day: 1.50    Types: Cigarettes  . Smokeless tobacco: Never Used  Substance and Sexual Activity  . Alcohol use: Yes    Alcohol/week: 2.0 standard drinks    Types: 2 Glasses of wine per week  . Drug use: Not on file  . Sexual activity: Never  Lifestyle  . Physical activity:    Days per week: Not on file    Minutes per session: Not on file  . Stress: Not on file  Relationships  . Social connections:    Talks on phone: Not on file    Gets together: Not on file    Attends religious service: Not on file    Active member of club or organization: Not on file    Attends meetings of clubs or organizations: Not on file    Relationship status: Not on file  . Intimate partner violence:    Fear of current or ex partner: Not on file    Emotionally abused: Not on file    Physically abused: Not on file    Forced sexual activity: Not on file  Other Topics Concern  . Not on file  Social History Narrative  . Not on file    Family History  Problem Relation Age of  Onset  . Breast cancer Neg Hx      Current Outpatient Medications:  .  Calcium-Vitamin D 600-200 MG-UNIT per tablet, Take by mouth., Disp: , Rfl:  .  letrozole (FEMARA) 2.5 MG tablet, Take 1 tablet (2.5 mg total) by mouth daily., Disp: 90 tablet, Rfl: 3 .  lisinopril-hydrochlorothiazide (PRINZIDE,ZESTORETIC) 20-12.5 MG per tablet, TAKE ONE TABLET BY MOUTH ONCE A DAY, Disp: , Rfl:   Physical exam:  Vitals:   11/11/17 1046  BP: 134/76  Pulse: 70  Resp: 16  Temp: 97.8 F (36.6 C)  TempSrc: Tympanic  SpO2: 98%  Weight: 135 lb (61.2 kg)   GENERAL: Patient is a well appearing female in no acute distress HEENT:  Sclerae anicteric.  Oropharynx clear and moist. No ulcerations or evidence of oropharyngeal candidiasis. Neck is supple.  NODES:  No cervical, supraclavicular, or axillary lymphadenopathy palpated.  LUNGS:  Clear to auscultation bilaterally.  No wheezes or rhonchi. HEART:  Regular rate and  rhythm. No murmur appreciated. ABDOMEN:  Soft, nontender.  Positive, normoactive bowel sounds. No organomegaly palpated. MSK:  No focal spinal tenderness to palpation. Full range of motion bilaterally in the upper extremities. EXTREMITIES:  No peripheral edema.   SKIN:  Clear with no obvious rashes or skin changes. No nail dyscrasia. NEURO:  Nonfocal. Well oriented.  Appropriate affect.   CMP Latest Ref Rng & Units 05/28/2017  Glucose 65 - 99 mg/dL 97  BUN 6 - 20 mg/dL 18  Creatinine 0.44 - 1.00 mg/dL 0.80  Sodium 135 - 145 mmol/L 138  Potassium 3.5 - 5.1 mmol/L 4.8  Chloride 101 - 111 mmol/L 103  CO2 22 - 32 mmol/L 28  Calcium 8.9 - 10.3 mg/dL 10.0  Total Protein 6.5 - 8.1 g/dL 7.6  Total Bilirubin 0.3 - 1.2 mg/dL 0.6  Alkaline Phos 38 - 126 U/L 73  AST 15 - 41 U/L 20  ALT 14 - 54 U/L 16   CBC Latest Ref Rng & Units 05/28/2017  WBC 3.6 - 11.0 K/uL 6.7  Hemoglobin 12.0 - 16.0 g/dL 14.6  Hematocrit 35.0 - 47.0 % 42.8  Platelets 150 - 440 K/uL 257    No images are attached to the  encounter.  No results found.  Assessment and plan- Patient is a 72 y.o. female with history of breast cancer who presents to symptom management clinic for malaise and shortness of breath.  1. 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 S/p lumpectomy and SLNBx. Oncotype RS = 19. RT w/ Dr. Baruch Gouty. 06/2014 started Arimidex. Switched to Letrozole 12/2014.  Will complete 5 years of AI therapy on 06/2019. Bone Density 07/2014- osteopenia. Continue calcium 1254m and vitamin d - at least 800 mcg/day (titrate up based on blood levels) and continue weight bearing exercise.   2. Fatigue & SOB w/ exertion- symptoms new, mild, increasing in frequency. Unclear etiology. Labs drawn today-CBC and CMP unremarkable.  No evidence of anemia or iron deficiency.  TSH normal.  CA-27-29 remains in normal range and continues to decrease.  Now 26.8 (previously 38.4).  History of vitamin D deficiency.  Vitamin D level today 27.5 (low)-while this may account for some of her fatigue I doubt that this is principal problem.  Etiology unclear- letrozole? Cardiac?   Case and findings discussed with Dr. BRogue Bussingwho recommends holding letrozole for 1 month and [facility].   If symptoms improve with holding medication would consider switching to steroidal AI such as Aromasin as letrozole is non-steroidal AI (was previously on anastrozole which is also non-steroidal AI). If symptoms persist or worsen, would consider following up with primary care for consideration of repeat stress echo vs holter monitor vs ekg.   3. Diarrhea- etiology unclear. Letrozole vs others? Per patient colonoscopy up to date. Could consider GI evaluation if persistent or worsening symptoms/not improved while holding letrozole.   rtc in 1 month for re-evaluation or sooner if needed. Would also recommend evaluation with PCP.    Patient expressed  understanding and was in agreement with this plan. She also understands that She can call clinic at any time with any questions, concerns, or complaints.   Visit Diagnosis 1. Carcinoma of lower-inner quadrant of left breast in female, estrogen receptor positive ([place]   2. Vitamin D deficiency   3. Fatigue, unspecified type   4. Diarrhea, unspecified type     Thank you for allowing  me to participate in the care of this very pleasant patient.   Beckey Rutter, DNP, AGNP-C [facility] (work cell) [phone] (office) 11/11/17 11:28 AM  CC: Dr. Carrie Mew

## 2017-11-12 LAB — CANCER ANTIGEN 27.29: CAN 27.29: 26.8 U/mL (ref 0.0–38.6)

## 2017-11-12 LAB — VITAMIN D 25 HYDROXY (VIT D DEFICIENCY, FRACTURES): Vit D, 25-Hydroxy: 27.5 ng/mL — ABNORMAL LOW (ref 30.0–100.0)

## 2017-12-16 ENCOUNTER — Inpatient Hospital Stay: Payer: Medicare Other | Attending: Internal Medicine | Admitting: Internal Medicine

## 2017-12-16 VITALS — BP 143/80 | HR 67 | Temp 97.3°F | Resp 16 | Wt 137.2 lb

## 2017-12-16 DIAGNOSIS — Z79811 Long term (current) use of aromatase inhibitors: Secondary | ICD-10-CM | POA: Diagnosis not present

## 2017-12-16 DIAGNOSIS — Z170 Estrogen receptor positive status [ER+]: Secondary | ICD-10-CM

## 2017-12-16 DIAGNOSIS — M8580 Other specified disorders of bone density and structure, unspecified site: Secondary | ICD-10-CM

## 2017-12-16 DIAGNOSIS — Z923 Personal history of irradiation: Secondary | ICD-10-CM

## 2017-12-16 DIAGNOSIS — Z87891 Personal history of nicotine dependence: Secondary | ICD-10-CM

## 2017-12-16 DIAGNOSIS — C50312 Malignant neoplasm of lower-inner quadrant of left female breast: Secondary | ICD-10-CM

## 2017-12-16 MED ORDER — EXEMESTANE 25 MG PO TABS: 25.0000 mg | ORAL_TABLET | Freq: Every day | ORAL | 3 refills | Status: DC

## 2017-12-16 NOTE — Assessment & Plan Note (Addendum)
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 Intermediate risk Oncotype; declined chemotherapy.  Stable.  # However, I seriously doubt patient's symptoms of intolerance [shortness of breath; night sweats; extreme fatigue; diarrhea] secondary to Femara.  Given her reluctance, discussed starting Aromasin; new scripts given.   #Again discussed the potential side effects of Aromasin.  New prescriptions provided.  # BMD- AUG 2018 Osteopenia. T score= -1.1/improved. Patient continues to be in Vitamin D Twice a Day. On excersing /lifting weights.   # follow up in 6 weeks/ no labs.

## 2017-12-16 NOTE — Progress Notes (Signed)
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  Patient approximately month ago noted to have worsening shortness of breath especially with exertion.  She had diarrhea.  Also had night sweats and also extreme fatigue; itchy eyes..  She had blood work/follow-up in the clinic that was negative for any acute process.  Given the symptoms patient stopped taking her Femara.  Patient states that she is currently back to baseline health.  She is fairly current is at her symptoms are secondary to Femara.  Review of Systems  Constitutional: Negative for chills, diaphoresis, fever, malaise/fatigue and weight loss.  HENT: Negative for nosebleeds and sore throat.   Eyes: Negative for double vision.  Respiratory: Negative for cough, hemoptysis, sputum production, shortness of breath and wheezing.   Cardiovascular: Negative for chest pain,  palpitations, orthopnea and leg swelling.  Gastrointestinal: Negative for abdominal pain, blood in stool, constipation, diarrhea, heartburn, melena, nausea and vomiting.  Genitourinary: Negative for dysuria, frequency and urgency.  Musculoskeletal: Negative for back pain and joint pain.  Skin: Negative.  Negative for itching and rash.  Neurological: Negative for dizziness, tingling, focal weakness, weakness and headaches.  Endo/Heme/Allergies: Does not bruise/bleed easily.  Psychiatric/Behavioral: Negative for depression. The patient is not nervous/anxious and does not have insomnia.     PAST MEDICAL HISTORY :  Past Medical History:  Diagnosis Date  . Breast cancer ([place]) 2015   left lumpectomy 04/05/2014 and reexcision 04/27/2014 - radiation treatment  . Hypertension     PAST SURGICAL HISTORY :   Past Surgical History:  Procedure Laterality Date  . ABDOMINAL HYSTERECTOMY    . BREAST BIOPSY Left 03/15/2014   Stereo biopsy  . BREAST LUMPECTOMY Left 04/03/2014 & 04/27/2014   INVASIVE MAMMARY CARCINOMA WITH MIXED DUCTAL AND LOBULAR FEATURES.   . COMBINED HYSTEROSCOPY DIAGNOSTIC / D&C    . PARTIAL MASTECTOMY WITH AXILLARY SENTINEL LYMPH NODE BIOPSY Left     FAMILY HISTORY :   Family History  Problem Relation Age of Onset  . Breast cancer Neg Hx     SOCIAL HISTORY:   Social History   Tobacco Use  . Smoking status: Former Smoker    Packs/day: 1.50    Types: Cigarettes  . Smokeless tobacco: Never Used  Substance Use Topics  . Alcohol use: Yes    Alcohol/week: 2.0 standard drinks    Types: 2 Glasses of wine per week  . Drug use: Not on file    ALLERGIES:  is allergic to codone [hydrocodone]; percocet [oxycodone-acetaminophen]; and penicillins.  MEDICATIONS:  Current Outpatient Medications  Medication Sig Dispense Refill  . Calcium-Vitamin D 600-200 MG-UNIT per tablet Take by mouth.    Marland Kitchen lisinopril-hydrochlorothiazide (PRINZIDE,ZESTORETIC) 20-12.5 MG per tablet TAKE ONE  TABLET BY MOUTH ONCE A DAY    . exemestane (AROMASIN) 25 MG tablet Take 1 tablet (25 mg total) by mouth daily after breakfast. 30 tablet 3   No current facility-administered medications for this visit.     PHYSICAL EXAMINATION: ECOG PERFORMANCE STATUS: 0 - Asymptomatic  BP (!) 143/80 (BP Location: Left Arm, Patient Position: Sitting)   Pulse 67   Temp (!) 97.3 F (36.3 C) (Tympanic)   Resp 16   Wt 137 lb 3.2 oz (62.2 kg)   BMI 24.69 kg/m   Filed Weights   12/16/17 1034  Weight: 137 lb 3.2 oz (62.2 kg)    Physical Exam  Constitutional: She is oriented to person, place, and time and well-developed, well-nourished, and in no distress.  HENT:  Head: Normocephalic and atraumatic.  Mouth/Throat: Oropharynx is clear and moist. No oropharyngeal exudate.  Eyes: Pupils are equal, round, and reactive to light.  Neck: Normal range of motion. Neck supple.  Cardiovascular: Normal rate and regular rhythm.  Pulmonary/Chest: Breath sounds normal. No respiratory distress. She has no wheezes.  Abdominal: Soft. Bowel sounds are normal. She exhibits no distension and no mass. There is no tenderness. There is no rebound and no guarding.  Musculoskeletal: Normal range of motion. She exhibits no edema or tenderness.  Neurological: She is alert and oriented to person, place, and time.  Skin: Skin is warm.  Psychiatric: Affect normal.      LABORATORY DATA:  I have reviewed the data as listed    Component Value Date/Time   NA 139 11/11/2017 1146   K 4.5 11/11/2017 1146   K 3.9 03/29/2014 1402   CL 103 11/11/2017 1146   CO2 26 11/11/2017 1146   GLUCOSE 99 11/11/2017 1146   BUN 14 11/11/2017 1146   CREATININE 0.75 11/11/2017 1146   CREATININE 0.74 07/17/2014 1005   CALCIUM 10.0 11/11/2017 1146   PROT 7.6 11/11/2017 1146   PROT 7.4 07/17/2014 1005   ALBUMIN 4.5 11/11/2017 1146   ALBUMIN 4.4 07/17/2014 1005   AST 21 11/11/2017 1146   AST 20 07/17/2014 1005   ALT 16 11/11/2017 1146    ALT 16 07/17/2014 1005   ALKPHOS 72 11/11/2017 1146   ALKPHOS 70 07/17/2014 1005   BILITOT 0.8 11/11/2017 1146   BILITOT 0.7 07/17/2014 1005   GFRNONAA >60 11/11/2017 1146   GFRNONAA >60 07/17/2014 1005   GFRAA >60 11/11/2017 1146   GFRAA >60 07/17/2014 1005    No results found for: SPEP, UPEP  Lab Results  Component Value Date   WBC 6.0 11/11/2017   NEUTROABS 3.1 11/11/2017   HGB 14.9 11/11/2017   HCT 44.0 11/11/2017   MCV 92.4 11/11/2017   PLT 262 11/11/2017      Chemistry      Component Value Date/Time   NA 139 11/11/2017 1146   K 4.5 11/11/2017 1146   K 3.9 03/29/2014 1402   CL 103 11/11/2017 1146   CO2 26 11/11/2017 1146   BUN 14 11/11/2017 1146   CREATININE 0.75 11/11/2017 1146   CREATININE 0.74 07/17/2014 1005      Component Value Date/Time   CALCIUM 10.0 11/11/2017 1146   ALKPHOS 72 11/11/2017 1146   ALKPHOS 70 07/17/2014 1005  AST 21 11/11/2017 1146   AST 20 07/17/2014 1005   ALT 16 11/11/2017 1146   ALT 16 07/17/2014 1005   BILITOT 0.8 11/11/2017 1146   BILITOT 0.7 07/17/2014 1005       RADIOGRAPHIC STUDIES: I have personally reviewed the radiological images as listed and agreed with the findings in the report. No results found.   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 Intermediate risk Oncotype; declined chemotherapy.  Stable.  # However, I seriously doubt patient's symptoms of intolerance [shortness of breath; night sweats; extreme fatigue; diarrhea] secondary to Femara.  Given her reluctance, discussed starting Aromasin; new scripts given.   #Again discussed the potential side effects of Aromasin.  New prescriptions provided.  # BMD- AUG 2018 Osteopenia. T score= -1.1/improved. Patient continues to be in Vitamin D Twice a Day. On excersing /lifting weights.   # follow up  in 6 weeks/ no labs.      Cammie Sickle, MD 12/16/2017 1:21 PM

## 2018-01-27 ENCOUNTER — Inpatient Hospital Stay: Payer: Medicare Other | Attending: Internal Medicine | Admitting: Internal Medicine

## 2018-01-27 VITALS — BP 112/73 | HR 77 | Temp 99.9°F | Resp 16 | Wt 140.0 lb

## 2018-01-27 DIAGNOSIS — M25552 Pain in left hip: Secondary | ICD-10-CM | POA: Diagnosis not present

## 2018-01-27 DIAGNOSIS — M8580 Other specified disorders of bone density and structure, unspecified site: Secondary | ICD-10-CM

## 2018-01-27 DIAGNOSIS — Z79811 Long term (current) use of aromatase inhibitors: Secondary | ICD-10-CM | POA: Insufficient documentation

## 2018-01-27 DIAGNOSIS — Z923 Personal history of irradiation: Secondary | ICD-10-CM | POA: Diagnosis not present

## 2018-01-27 DIAGNOSIS — Z170 Estrogen receptor positive status [ER+]: Secondary | ICD-10-CM | POA: Diagnosis not present

## 2018-01-27 DIAGNOSIS — C50312 Malignant neoplasm of lower-inner quadrant of left female breast: Secondary | ICD-10-CM | POA: Insufficient documentation

## 2018-01-27 DIAGNOSIS — Z9181 History of falling: Secondary | ICD-10-CM | POA: Diagnosis not present

## 2018-01-27 NOTE — Assessment & Plan Note (Addendum)
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 Intermediate risk Oncotype; declined chemotherapy.  Stable.  # Tolerating Aromasin better. Continue aromasin.   # BMD- AUG 2018 Osteopenia. T score= -1.1/improved. Patient continues to be in Vitamin D Twice a Day. On excersing /lifting weights; STABLE.   # Left hip pain s/p fall; clinically no concerns for fracture/DVT.- recommend NSAIds/ ice pack.  If not improved to call us.  # DISPOSITION:  #follow up in 6 months/MD-cbc/cmp/ca-27-29- Dr.B

## 2018-01-27 NOTE — Progress Notes (Signed)
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  Since starting Aromasin patient noted to have improvement of her fatigue also noted to have improvement of her hot flashes.  Patient states she fell/tripped on her foot hit her left hip.  Complains of pain in the left hip no significant radiation.  She is limping.  Patient just started taking Motrin this morning.  Has been fairly active all throughout.  Review of Systems  Constitutional: Negative for chills, diaphoresis, fever, malaise/fatigue and weight loss.  HENT: Negative for nosebleeds and sore throat.   Eyes: Negative for double vision.  Respiratory: Negative for cough, hemoptysis, sputum production, shortness of breath and wheezing.   Cardiovascular: Negative for chest pain, palpitations, orthopnea and leg swelling.  Gastrointestinal: Negative for abdominal pain, blood in stool, constipation,  diarrhea, heartburn, melena, nausea and vomiting.  Genitourinary: Negative for dysuria, frequency and urgency.  Musculoskeletal: Positive for joint pain. Negative for back pain.  Skin: Negative.  Negative for itching and rash.  Neurological: Negative for dizziness, tingling, focal weakness, weakness and headaches.  Endo/Heme/Allergies: Does not bruise/bleed easily.  Psychiatric/Behavioral: Negative for depression. The patient is not nervous/anxious and does not have insomnia.     PAST MEDICAL HISTORY :  Past Medical History:  Diagnosis Date  . Breast cancer ([place]) 2015   left lumpectomy 04/05/2014 and reexcision 04/27/2014 - radiation treatment  . Hypertension     PAST SURGICAL HISTORY :   Past Surgical History:  Procedure Laterality Date  . ABDOMINAL HYSTERECTOMY    . BREAST BIOPSY Left 03/15/2014   Stereo biopsy  . BREAST LUMPECTOMY Left 04/03/2014 & 04/27/2014   INVASIVE MAMMARY CARCINOMA WITH MIXED DUCTAL AND LOBULAR FEATURES.   . COMBINED HYSTEROSCOPY DIAGNOSTIC / D&C    . PARTIAL MASTECTOMY WITH AXILLARY SENTINEL LYMPH NODE BIOPSY Left     FAMILY HISTORY :   Family History  Problem Relation Age of Onset  . Breast cancer Neg Hx     SOCIAL HISTORY:   Social History   Tobacco Use  . Smoking status: Former Smoker    Packs/day: 1.50    Types: Cigarettes  . Smokeless tobacco: Never Used  Substance Use Topics  . Alcohol use: Yes    Alcohol/week: 2.0 standard drinks    Types: 2 Glasses of wine per week  . Drug use: Not on file    ALLERGIES:  is allergic to codone [hydrocodone]; percocet [oxycodone-acetaminophen]; and penicillins.  MEDICATIONS:  Current Outpatient Medications  Medication  Sig Dispense Refill  . Calcium-Vitamin D 600-200 MG-UNIT per tablet Take by mouth.    Marland Kitchen exemestane (AROMASIN) 25 MG tablet Take 1 tablet (25 mg total) by mouth daily after breakfast. 30 tablet 3  . lisinopril-hydrochlorothiazide (PRINZIDE,ZESTORETIC) 20-12.5 MG per tablet TAKE ONE  TABLET BY MOUTH ONCE A DAY     No current facility-administered medications for this visit.     PHYSICAL EXAMINATION: ECOG PERFORMANCE STATUS: 0 - Asymptomatic  BP 112/73 (BP Location: Left Arm, Patient Position: Sitting)   Pulse 77   Temp 99.9 F (37.7 C)   Resp 16   Wt 140 lb (63.5 kg)   BMI 25.20 kg/m   Filed Weights   01/27/18 1007  Weight: 140 lb (63.5 kg)    Physical Exam  Constitutional: She is oriented to person, place, and time and well-developed, well-nourished, and in no distress.  HENT:  Head: Normocephalic and atraumatic.  Mouth/Throat: Oropharynx is clear and moist. No oropharyngeal exudate.  Eyes: Pupils are equal, round, and reactive to light.  Neck: Normal range of motion. Neck supple.  Cardiovascular: Normal rate and regular rhythm.  Pulmonary/Chest: Breath sounds normal. No respiratory distress. She has no wheezes.  Abdominal: Soft. Bowel sounds are normal. She exhibits no distension and no mass. There is no tenderness. There is no rebound and no guarding.  Musculoskeletal: Normal range of motion. She exhibits no edema or tenderness.  Neurological: She is alert and oriented to person, place, and time.  Skin: Skin is warm.  Psychiatric: Affect normal.      LABORATORY DATA:  I have reviewed the data as listed    Component Value Date/Time   NA 139 11/11/2017 1146   K 4.5 11/11/2017 1146   K 3.9 03/29/2014 1402   CL 103 11/11/2017 1146   CO2 26 11/11/2017 1146   GLUCOSE 99 11/11/2017 1146   BUN 14 11/11/2017 1146   CREATININE 0.75 11/11/2017 1146   CREATININE 0.74 07/17/2014 1005   CALCIUM 10.0 11/11/2017 1146   PROT 7.6 11/11/2017 1146   PROT 7.4 07/17/2014 1005   ALBUMIN 4.5 11/11/2017 1146   ALBUMIN 4.4 07/17/2014 1005   AST 21 11/11/2017 1146   AST 20 07/17/2014 1005   ALT 16 11/11/2017 1146   ALT 16 07/17/2014 1005   ALKPHOS 72 11/11/2017 1146   ALKPHOS 70 07/17/2014 1005   BILITOT 0.8 11/11/2017 1146   BILITOT 0.7 07/17/2014 1005    GFRNONAA >60 11/11/2017 1146   GFRNONAA >60 07/17/2014 1005   GFRAA >60 11/11/2017 1146   GFRAA >60 07/17/2014 1005    No results found for: SPEP, UPEP  Lab Results  Component Value Date   WBC 6.0 11/11/2017   NEUTROABS 3.1 11/11/2017   HGB 14.9 11/11/2017   HCT 44.0 11/11/2017   MCV 92.4 11/11/2017   PLT 262 11/11/2017      Chemistry      Component Value Date/Time   NA 139 11/11/2017 1146   K 4.5 11/11/2017 1146   K 3.9 03/29/2014 1402   CL 103 11/11/2017 1146   CO2 26 11/11/2017 1146   BUN 14 11/11/2017 1146   CREATININE 0.75 11/11/2017 1146   CREATININE 0.74 07/17/2014 1005      Component Value Date/Time   CALCIUM 10.0 11/11/2017 1146   ALKPHOS 72 11/11/2017 1146   ALKPHOS 70 07/17/2014 1005   AST 21 11/11/2017 1146   AST 20 07/17/2014 1005   ALT 16 11/11/2017 1146   ALT 16 07/17/2014 1005  BILITOT 0.8 11/11/2017 1146   BILITOT 0.7 07/17/2014 1005       RADIOGRAPHIC STUDIES: I have personally reviewed the radiological images as listed and agreed with the findings in the report. No results found.   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 Intermediate risk Oncotype; declined chemotherapy.  Stable.  # Tolerating Aromasin better. Continue aromasin.   # BMD- AUG 2018 Osteopenia. T score= -1.1/improved. Patient continues to be in Vitamin D Twice a Day. On excersing /lifting weights; STABLE.   # Left hip pain s/p fall; clinically no concerns for fracture/DVT.- recommend NSAIds/ ice pack.  If not improved to call us.  # DISPOSITION:  #follow up in 6 months/MD-cbc/cmp/ca-27-29- Dr.B     Cammie Sickle, MD 01/27/2018 10:29 AM

## 2018-02-15 ENCOUNTER — Other Ambulatory Visit: Payer: Self-pay | Admitting: Physician Assistant

## 2018-02-15 DIAGNOSIS — C50312 Malignant neoplasm of lower-inner quadrant of left female breast: Secondary | ICD-10-CM

## 2018-04-04 ENCOUNTER — Ambulatory Visit
Admission: RE | Admit: 2018-04-04 | Discharge: 2018-04-04 | Disposition: A | Discharge status: Home / Self Care | Payer: Medicare Other | Source: Ambulatory Visit | Attending: Physician Assistant | Admitting: Physician Assistant

## 2018-04-04 DIAGNOSIS — C50312 Malignant neoplasm of lower-inner quadrant of left female breast: Secondary | ICD-10-CM | POA: Diagnosis present

## 2018-04-04 HISTORY — DX: Personal history of irradiation: Z92.3

## 2018-05-13 ENCOUNTER — Other Ambulatory Visit: Payer: Self-pay | Admitting: *Deleted

## 2018-05-13 MED ORDER — EXEMESTANE 25 MG PO TABS: 25.0000 mg | ORAL_TABLET | Freq: Every day | ORAL | 1 refills | Status: DC

## 2018-05-23 ENCOUNTER — Ambulatory Visit: Payer: Medicare Other | Admitting: Radiation Oncology

## 2018-06-03 ENCOUNTER — Ambulatory Visit: Payer: Medicare Other | Admitting: Internal Medicine

## 2018-06-03 ENCOUNTER — Other Ambulatory Visit: Payer: Medicare Other

## 2018-07-14 ENCOUNTER — Telehealth: Payer: Self-pay | Admitting: *Deleted

## 2018-07-14 NOTE — Telephone Encounter (Signed)
Attempted to reach patient regarding a telehealth apt next week. Pt not at home. Left msg with patient's husband Dana Snow to determine if pt would like to do this next week. He states most likely his wife would do this, but he wanted her to make that decision. He will give her a msg. I also left a vm for patient to return my phone call on her personal cell #. Will wait for patient to return my phone call.

## 2018-07-14 NOTE — Telephone Encounter (Signed)
Patient returned my phone call. She does not have any webcam on her iphone. She is not having any medical issues at this time. She states that she likes this arrangement for the doctor to contact her on the phone, but prefers a phone call to her home phone or her mobile phone on Monday. Thanks.

## 2018-07-17 ENCOUNTER — Other Ambulatory Visit: Payer: Self-pay

## 2018-07-18 ENCOUNTER — Encounter: Payer: Self-pay | Admitting: Internal Medicine

## 2018-07-18 ENCOUNTER — Inpatient Hospital Stay: Payer: Medicare Other | Attending: Internal Medicine

## 2018-07-18 ENCOUNTER — Other Ambulatory Visit: Payer: Self-pay

## 2018-07-18 ENCOUNTER — Inpatient Hospital Stay (HOSPITAL_BASED_OUTPATIENT_CLINIC_OR_DEPARTMENT_OTHER): Payer: Medicare Other | Admitting: Internal Medicine

## 2018-07-18 DIAGNOSIS — Z170 Estrogen receptor positive status [ER+]: Secondary | ICD-10-CM

## 2018-07-18 DIAGNOSIS — M8580 Other specified disorders of bone density and structure, unspecified site: Secondary | ICD-10-CM | POA: Diagnosis not present

## 2018-07-18 DIAGNOSIS — Z79811 Long term (current) use of aromatase inhibitors: Secondary | ICD-10-CM | POA: Insufficient documentation

## 2018-07-18 DIAGNOSIS — C50312 Malignant neoplasm of lower-inner quadrant of left female breast: Secondary | ICD-10-CM

## 2018-07-18 LAB — CBC WITH DIFFERENTIAL/PLATELET
Abs Immature Granulocytes: 0.01 10*3/uL (ref 0.00–0.07)
Basophils Absolute: 0.1 10*3/uL (ref 0.0–0.1)
Basophils Relative: 1 %
Eosinophils Absolute: 0.2 10*3/uL (ref 0.0–0.5)
Eosinophils Relative: 4 %
HCT: 45 % (ref 36.0–46.0)
Hemoglobin: 14.6 g/dL (ref 12.0–15.0)
Immature Granulocytes: 0 %
Lymphocytes Relative: 32 %
Lymphs Abs: 1.9 10*3/uL (ref 0.7–4.0)
MCH: 30.6 pg (ref 26.0–34.0)
MCHC: 32.4 g/dL (ref 30.0–36.0)
MCV: 94.3 fL (ref 80.0–100.0)
Monocytes Absolute: 0.6 10*3/uL (ref 0.1–1.0)
Monocytes Relative: 10 %
Neutro Abs: 3.2 10*3/uL (ref 1.7–7.7)
Neutrophils Relative %: 53 %
Platelets: 238 10*3/uL (ref 150–400)
RBC: 4.77 MIL/uL (ref 3.87–5.11)
RDW: 12.9 % (ref 11.5–15.5)
WBC: 6.1 10*3/uL (ref 4.0–10.5)
nRBC: 0 % (ref 0.0–0.2)

## 2018-07-18 LAB — COMPREHENSIVE METABOLIC PANEL
ALT: 15 U/L (ref 0–44)
AST: 17 U/L (ref 15–41)
Albumin: 4 g/dL (ref 3.5–5.0)
Alkaline Phosphatase: 68 U/L (ref 38–126)
Anion gap: 8 (ref 5–15)
BUN: 16 mg/dL (ref 8–23)
CO2: 25 mmol/L (ref 22–32)
Calcium: 9.5 mg/dL (ref 8.9–10.3)
Chloride: 105 mmol/L (ref 98–111)
Creatinine, Ser: 0.87 mg/dL (ref 0.44–1.00)
GFR calc Af Amer: >60 mL/min (ref >60)
GFR calc non Af Amer: >60 mL/min (ref >60)
Glucose, Bld: 104 mg/dL — ABNORMAL HIGH (ref 70–99)
Potassium: 4.5 mmol/L (ref 3.5–5.1)
Sodium: 138 mmol/L (ref 135–145)
Total Bilirubin: 0.7 mg/dL (ref 0.3–1.2)
Total Protein: 7.4 g/dL (ref 6.5–8.1)

## 2018-07-18 NOTE — Progress Notes (Signed)
I connected with Dana Snow on 07/18/18 at 10:45 AM EDT by telephone visit and verified that I am speaking with the correct person using two identifiers.  I discussed the limitations, risks, security and privacy concerns of performing an evaluation and management service by telemedicine and the availability of in-person appointments. I also discussed with the patient that there may be a patient responsible charge related to this service. The patient expressed understanding and agreed to proceed.    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  female with history of breast cancer.  Patient denies any new lumps or bumps.  Appetite is good but no bone pain.  No falls.  No headaches.  Observation/objective: CBC within normal limits.  Chemistries pending.  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 Intermediate risk Oncotype; declined chemotherapy.  STABLE.   #  Tolerating Aromasin better. Continue aromasin until follow-up 2021.  # BMD- AUG 2018 Osteopenia. T score= -1.1/improved.  Continue vitamin D Twice a Day. On excersing /lifting weights; stable  # Left hip pain s/p fall- resolved.   # DISPOSITION:  #follow up in 6 months/MD-cbc/cmp/ca-27-29- Dr.B    Follow-up instructions:  I discussed the assessment and treatment plan with the patient.  The patient was provided an opportunity to ask questions and all were answered.  The patient agreed with the plan and demonstrated understanding of instructions.  The patient was advised to call back or seek an in person evaluation if the symptoms worsen or if the condition fails to improve as anticipated.  I provided 13 minutes of non face-to-face telephone visit time during this encounter, and > 50% was spent counseling as documented under my assessment & plan.   Dr. Charlaine Dalton [place] at [facility] 07/18/2018 10:57 AM

## 2018-07-18 NOTE — Assessment & Plan Note (Addendum)
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 Intermediate risk Oncotype; declined chemotherapy.  STABLE.   # Tolerating Aromasin better. Continue aromasin until follow-up 2021.  # BMD- AUG 2018 Osteopenia. T score= -1.1/improved.  Continue vitamin D Twice a Day. On excersing /lifting weights; stable  # Left hip pain s/p fall- resolved.   # DISPOSITION:  #follow up in 6 months/MD-cbc/cmp/ca-27-29- Dr.B

## 2018-07-19 LAB — CANCER ANTIGEN 27.29: CA 27.29: 32.8 U/mL (ref 0.0–38.6)

## 2018-07-28 ENCOUNTER — Ambulatory Visit: Payer: Medicare Other | Admitting: Internal Medicine

## 2018-07-28 ENCOUNTER — Other Ambulatory Visit: Payer: Medicare Other

## 2019-01-12 ENCOUNTER — Encounter: Payer: Self-pay | Admitting: *Deleted

## 2019-01-16 ENCOUNTER — Inpatient Hospital Stay: Payer: Medicare Other | Attending: Internal Medicine

## 2019-01-16 ENCOUNTER — Encounter: Payer: Self-pay | Admitting: Nurse Practitioner

## 2019-01-16 ENCOUNTER — Other Ambulatory Visit: Payer: Self-pay

## 2019-01-16 ENCOUNTER — Inpatient Hospital Stay: Payer: Medicare Other | Admitting: Nurse Practitioner

## 2019-01-16 VITALS — BP 126/70 | HR 72 | Temp 98.5°F | Resp 16 | Wt 139.6 lb

## 2019-01-16 DIAGNOSIS — M8580 Other specified disorders of bone density and structure, unspecified site: Secondary | ICD-10-CM | POA: Diagnosis not present

## 2019-01-16 DIAGNOSIS — I10 Essential (primary) hypertension: Secondary | ICD-10-CM | POA: Insufficient documentation

## 2019-01-16 DIAGNOSIS — C50312 Malignant neoplasm of lower-inner quadrant of left female breast: Secondary | ICD-10-CM

## 2019-01-16 DIAGNOSIS — Z79811 Long term (current) use of aromatase inhibitors: Secondary | ICD-10-CM | POA: Diagnosis not present

## 2019-01-16 DIAGNOSIS — Z87891 Personal history of nicotine dependence: Secondary | ICD-10-CM | POA: Insufficient documentation

## 2019-01-16 DIAGNOSIS — Z7289 Other problems related to lifestyle: Secondary | ICD-10-CM | POA: Insufficient documentation

## 2019-01-16 DIAGNOSIS — Z853 Personal history of malignant neoplasm of breast: Secondary | ICD-10-CM | POA: Diagnosis not present

## 2019-01-16 DIAGNOSIS — R5383 Other fatigue: Secondary | ICD-10-CM | POA: Diagnosis not present

## 2019-01-16 DIAGNOSIS — Z170 Estrogen receptor positive status [ER+]: Secondary | ICD-10-CM | POA: Diagnosis not present

## 2019-01-16 DIAGNOSIS — Z885 Allergy status to narcotic agent status: Secondary | ICD-10-CM | POA: Insufficient documentation

## 2019-01-16 DIAGNOSIS — Z880 Allergy status to penicillin: Secondary | ICD-10-CM | POA: Diagnosis not present

## 2019-01-16 LAB — CBC WITH DIFFERENTIAL/PLATELET
Abs Immature Granulocytes: 0.02 10*3/uL (ref 0.00–0.07)
Basophils Absolute: 0.1 10*3/uL (ref 0.0–0.1)
Basophils Relative: 1 %
Eosinophils Absolute: 0.2 10*3/uL (ref 0.0–0.5)
Eosinophils Relative: 4 %
HCT: 45.3 % (ref 36.0–46.0)
Hemoglobin: 14.7 g/dL (ref 12.0–15.0)
Immature Granulocytes: 0 %
Lymphocytes Relative: 31 %
Lymphs Abs: 1.9 10*3/uL (ref 0.7–4.0)
MCH: 30.8 pg (ref 26.0–34.0)
MCHC: 32.5 g/dL (ref 30.0–36.0)
MCV: 94.8 fL (ref 80.0–100.0)
Monocytes Absolute: 0.6 10*3/uL (ref 0.1–1.0)
Monocytes Relative: 10 %
Neutro Abs: 3.3 10*3/uL (ref 1.7–7.7)
Neutrophils Relative %: 54 %
Platelets: 265 10*3/uL (ref 150–400)
RBC: 4.78 MIL/uL (ref 3.87–5.11)
RDW: 12.6 % (ref 11.5–15.5)
WBC: 6.1 10*3/uL (ref 4.0–10.5)
nRBC: 0 % (ref 0.0–0.2)

## 2019-01-16 LAB — COMPREHENSIVE METABOLIC PANEL
ALT: 18 U/L (ref 0–44)
AST: 19 U/L (ref 15–41)
Albumin: 4.1 g/dL (ref 3.5–5.0)
Alkaline Phosphatase: 70 U/L (ref 38–126)
Anion gap: 6 (ref 5–15)
BUN: 14 mg/dL (ref 8–23)
CO2: 28 mmol/L (ref 22–32)
Calcium: 9.5 mg/dL (ref 8.9–10.3)
Chloride: 102 mmol/L (ref 98–111)
Creatinine, Ser: 0.81 mg/dL (ref 0.44–1.00)
GFR calc Af Amer: >60 mL/min (ref >60)
GFR calc non Af Amer: >60 mL/min (ref >60)
Glucose, Bld: 112 mg/dL — ABNORMAL HIGH (ref 70–99)
Potassium: 4.4 mmol/L (ref 3.5–5.1)
Sodium: 136 mmol/L (ref 135–145)
Total Bilirubin: 0.7 mg/dL (ref 0.3–1.2)
Total Protein: 7.4 g/dL (ref 6.5–8.1)

## 2019-01-16 NOTE — Assessment & Plan Note (Addendum)
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 Intermediate risk Oncotype. She declined chemotherapy.  Currently on Aromasin.  Started Arimidex in April 2016.  Switched over to [place] in October 2016.. Tolerating well. January 2020 mammogram was negative for malignancy (ordered by her PCP/Dr. Carrie Mew). Ca 27.29 pending from today. Continue Aromasin.   Osteopenia-August 2018 bone density scan consistent with osteopenia, T score -1.1.  Improved.  Will order repeat scan today.  Continue vitamin D and calcium supplementation.  Encouraged weightbearing exercise.  Stable.  DISPOSITION:  # Bone density scan asap #follow up in 6 months/MD-cbc/cmp/ca-27-29

## 2019-01-16 NOTE — Progress Notes (Signed)
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  Last mammogram on 04/04/2018 was negative for malignancy.  Recommendation to follow-up in 1 year.  Last bone density scan in August 2018 with T score of -1.1 consistent with osteopenia.  She continues calcium and vitamin D supplementation.  She continues Aromasin.  Fatigue is stable.  Hot flashes have improved.  She continues to be active and exercises regularly.  No recent falls.  She is hopeful to stop Aromasin  Review of Systems  Constitutional: Negative for chills and fever.  HENT: Negative for congestion and sore throat.   Eyes: Negative for blurred vision and redness.  Respiratory: Negative for cough and shortness of breath.   Cardiovascular: Negative for chest pain, palpitations and leg swelling.  Gastrointestinal: Negative for blood in stool, constipation, diarrhea, melena and  nausea.  Genitourinary: Negative for dysuria and urgency.  Musculoskeletal: Negative for falls, joint pain and myalgias.  Skin: Negative for itching and rash.  Neurological: Negative for dizziness, weakness and headaches.  Endo/Heme/Allergies: Negative for environmental allergies. Does not bruise/bleed easily.  Psychiatric/Behavioral: Negative for depression. The patient is not nervous/anxious and does not have insomnia.      PAST MEDICAL HISTORY :  Past Medical History:  Diagnosis Date  . Breast cancer ([place]) 2015   left lumpectomy 04/05/2014 and reexcision 04/27/2014 - radiation treatment  . Hypertension   . Personal history of radiation therapy 2015   Left breast    PAST SURGICAL HISTORY :   Past Surgical History:  Procedure Laterality Date  . ABDOMINAL HYSTERECTOMY    . BREAST BIOPSY Left 03/15/2014   SCLEROSING ADENOSIS AND COLUMNAR CELL CHANGE   . BREAST LUMPECTOMY Left 04/03/2014 & 04/27/2014   INVASIVE MAMMARY CARCINOMA WITH MIXED DUCTAL AND LOBULAR FEATURES.   . COMBINED HYSTEROSCOPY DIAGNOSTIC / D&C    . PARTIAL MASTECTOMY WITH AXILLARY SENTINEL LYMPH NODE BIOPSY Left     FAMILY HISTORY :   Family History  Problem Relation Age of Onset  . Breast cancer Neg Hx     SOCIAL HISTORY:   Social History   Tobacco Use  . Smoking status: Former Smoker    Packs/day: 1.50    Types: Cigarettes  . Smokeless tobacco: Never Used  Substance Use Topics  . Alcohol use: Yes    Alcohol/week: 2.0 standard drinks    Types: 2 Glasses of wine per week  . Drug use: Not on file    ALLERGIES:  is allergic to acetaminophen;  codeine; codone [hydrocodone]; oxycodone hcl; percocet [oxycodone-acetaminophen]; and penicillins.  MEDICATIONS:  Current Outpatient Medications  Medication Sig Dispense Refill  . Calcium-Vitamin D 600-200 MG-UNIT per tablet Take by mouth.    Marland Kitchen exemestane (AROMASIN) 25 MG tablet Take 1 tablet (25 mg total) by mouth daily after breakfast. 90 tablet 1  .  lisinopril-hydrochlorothiazide (ZESTORETIC) 10-12.5 MG tablet     . vitamin C (ASCORBIC ACID) 500 MG tablet Take 500 mg by mouth daily.    Marland Kitchen zinc gluconate 50 MG tablet Take 50 mg by mouth daily.     No current facility-administered medications for this visit.     PHYSICAL EXAMINATION: ECOG PERFORMANCE STATUS: 0 - Asymptomatic  BP 126/70 (Patient Position: Sitting)   Pulse 72   Temp 98.5 F (36.9 C) (Tympanic)   Resp 16   Wt 139 lb 9.6 oz (63.3 kg)   BMI 25.13 kg/m   Filed Weights   01/16/19 1050  Weight: 139 lb 9.6 oz (63.3 kg)    GENERAL: Well-nourished well-developed; Alert, no distress and comfortable. Alone.   EYES: no pallor or icterus OROPHARYNX: no thrush or ulceration NECK: supple; no lymph nodes felt LYMPH: no palpable lymphadenopathy in the axillary or inguinal regions LUNGS: Decreased breath sounds auscultation bilaterally. No wheeze or crackles HEART/CVS: regular rate & rhythm and no murmurs; No lower extremity edema ABDOMEN: abdomen soft, non-tender and normal bowel sounds. No hepatomegaly or splenomegaly.  Musculoskeletal: no cyanosis of digits and no clubbing  PSYCH: alert & oriented x 3 with fluent speech NEURO: no focal motor/sensory deficits SKIN: no rashes or significant lesions   LABORATORY DATA:  I have reviewed the data as listed    Component Value Date/Time   NA 136 01/16/2019 0953   K 4.4 01/16/2019 0953   K 3.9 03/29/2014 1402   CL 102 01/16/2019 0953   CO2 28 01/16/2019 0953   GLUCOSE 112 (H) 01/16/2019 0953   BUN 14 01/16/2019 0953   CREATININE 0.81 01/16/2019 0953   CREATININE 0.74 07/17/2014 1005   CALCIUM 9.5 01/16/2019 0953   PROT 7.4 01/16/2019 0953   PROT 7.4 07/17/2014 1005   ALBUMIN 4.1 01/16/2019 0953   ALBUMIN 4.4 07/17/2014 1005   AST 19 01/16/2019 0953   AST 20 07/17/2014 1005   ALT 18 01/16/2019 0953   ALT 16 07/17/2014 1005   ALKPHOS 70 01/16/2019 0953   ALKPHOS 70 07/17/2014 1005   BILITOT 0.7 01/16/2019 0953    BILITOT 0.7 07/17/2014 1005   GFRNONAA >60 01/16/2019 0953   GFRNONAA >60 07/17/2014 1005   GFRAA >60 01/16/2019 0953   GFRAA >60 07/17/2014 1005    No results found for: SPEP, UPEP  Lab Results  Component Value Date   WBC 6.1 01/16/2019   NEUTROABS 3.3 01/16/2019   HGB 14.7 01/16/2019   HCT 45.3 01/16/2019   MCV 94.8 01/16/2019   PLT 265 01/16/2019      Chemistry      Component Value Date/Time   NA 136 01/16/2019 0953   K 4.4 01/16/2019 0953   K 3.9 03/29/2014 1402   CL 102 01/16/2019 0953   CO2 28 01/16/2019 0953   BUN 14 01/16/2019 0953   CREATININE 0.81 01/16/2019 0953   CREATININE 0.74 07/17/2014 1005      Component Value Date/Time   CALCIUM 9.5 01/16/2019 0953   ALKPHOS 70 01/16/2019 0953   ALKPHOS 70 07/17/2014 1005   AST 19 01/16/2019 0953   AST 20 07/17/2014 1005   ALT 18  01/16/2019 0953   ALT 16 07/17/2014 1005   BILITOT 0.7 01/16/2019 0953   BILITOT 0.7 07/17/2014 1005      RADIOGRAPHIC STUDIES: I have personally reviewed the radiological images as listed and agreed with the findings in the report.  04/04/2018- MM Diag Breast TOMO Bilateral-no mammographic evidence of malignancy in either breast.  Status post lumpectomy.  11/02/2016- DG bone density-T score of -1.1 in femur right neck consistent with osteopenia  No results found.    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 Intermediate risk Oncotype. She declined chemotherapy.  Currently on Aromasin.  Started Arimidex in April 2016.  Switched over to [place] in October 2016.. Tolerating well. January 2020 mammogram was negative for malignancy (ordered by her PCP/Dr. Carrie Mew). Ca 27.29 pending from today. Continue Aromasin.   Osteopenia-August 2018 bone density scan consistent with osteopenia, T score -1.1.  Improved.  Will order repeat scan  today.  Continue vitamin D and calcium supplementation.  Encouraged weightbearing exercise.  Stable.  DISPOSITION:  # Bone density scan asap #follow up in 6 months/MD-cbc/cmp/ca-27-29  Beckey Rutter, DNP, AGNP-C [place] at [place]: Dr. Rogue Bussing

## 2019-01-17 LAB — CANCER ANTIGEN 27.29: CA 27.29: 28.9 U/mL (ref 0.0–38.6)

## 2019-01-20 ENCOUNTER — Encounter: Payer: Self-pay | Admitting: Nurse Practitioner

## 2019-01-24 ENCOUNTER — Other Ambulatory Visit: Payer: Self-pay | Admitting: Internal Medicine

## 2019-01-25 ENCOUNTER — Other Ambulatory Visit: Payer: Self-pay | Admitting: *Deleted

## 2019-01-25 MED ORDER — EXEMESTANE 25 MG PO TABS: 25.0000 mg | ORAL_TABLET | Freq: Every day | ORAL | 0 refills | Status: DC

## 2019-02-02 ENCOUNTER — Other Ambulatory Visit: Payer: Medicare Other

## 2019-02-09 ENCOUNTER — Ambulatory Visit
Admission: RE | Admit: 2019-02-09 | Discharge: 2019-02-09 | Disposition: A | Discharge status: Home / Self Care | Payer: Medicare Other | Source: Ambulatory Visit | Attending: Nurse Practitioner | Admitting: Nurse Practitioner

## 2019-02-09 DIAGNOSIS — C50312 Malignant neoplasm of lower-inner quadrant of left female breast: Secondary | ICD-10-CM | POA: Insufficient documentation

## 2019-02-09 DIAGNOSIS — Z170 Estrogen receptor positive status [ER+]: Secondary | ICD-10-CM | POA: Insufficient documentation

## 2019-02-09 DIAGNOSIS — Z79811 Long term (current) use of aromatase inhibitors: Secondary | ICD-10-CM

## 2019-02-09 DIAGNOSIS — M85852 Other specified disorders of bone density and structure, left thigh: Secondary | ICD-10-CM | POA: Insufficient documentation

## 2019-02-13 ENCOUNTER — Other Ambulatory Visit: Payer: Self-pay | Admitting: Physician Assistant

## 2019-02-13 DIAGNOSIS — C50312 Malignant neoplasm of lower-inner quadrant of left female breast: Secondary | ICD-10-CM

## 2019-02-13 DIAGNOSIS — Z1231 Encounter for screening mammogram for malignant neoplasm of breast: Secondary | ICD-10-CM

## 2019-04-11 ENCOUNTER — Ambulatory Visit
Admission: RE | Admit: 2019-04-11 | Discharge: 2019-04-11 | Disposition: A | Discharge status: Home / Self Care | Payer: Medicare PPO | Source: Ambulatory Visit | Attending: Physician Assistant | Admitting: Physician Assistant

## 2019-04-11 DIAGNOSIS — Z1231 Encounter for screening mammogram for malignant neoplasm of breast: Secondary | ICD-10-CM | POA: Insufficient documentation

## 2019-04-11 DIAGNOSIS — C50312 Malignant neoplasm of lower-inner quadrant of left female breast: Secondary | ICD-10-CM | POA: Diagnosis present

## 2019-07-14 ENCOUNTER — Other Ambulatory Visit: Payer: Self-pay

## 2019-07-14 ENCOUNTER — Encounter: Payer: Self-pay | Admitting: Internal Medicine

## 2019-07-17 ENCOUNTER — Inpatient Hospital Stay (HOSPITAL_BASED_OUTPATIENT_CLINIC_OR_DEPARTMENT_OTHER): Payer: Medicare PPO | Admitting: Internal Medicine

## 2019-07-17 ENCOUNTER — Inpatient Hospital Stay: Payer: Medicare PPO | Attending: Internal Medicine | Admitting: Oncology

## 2019-07-17 ENCOUNTER — Other Ambulatory Visit: Payer: Self-pay

## 2019-07-17 DIAGNOSIS — C50312 Malignant neoplasm of lower-inner quadrant of left female breast: Secondary | ICD-10-CM

## 2019-07-17 DIAGNOSIS — Z79899 Other long term (current) drug therapy: Secondary | ICD-10-CM | POA: Diagnosis not present

## 2019-07-17 DIAGNOSIS — Z880 Allergy status to penicillin: Secondary | ICD-10-CM | POA: Diagnosis not present

## 2019-07-17 DIAGNOSIS — R5383 Other fatigue: Secondary | ICD-10-CM | POA: Insufficient documentation

## 2019-07-17 DIAGNOSIS — U071 COVID-19: Secondary | ICD-10-CM | POA: Diagnosis not present

## 2019-07-17 DIAGNOSIS — I10 Essential (primary) hypertension: Secondary | ICD-10-CM | POA: Diagnosis not present

## 2019-07-17 DIAGNOSIS — M8580 Other specified disorders of bone density and structure, unspecified site: Secondary | ICD-10-CM | POA: Diagnosis not present

## 2019-07-17 DIAGNOSIS — Z923 Personal history of irradiation: Secondary | ICD-10-CM | POA: Diagnosis not present

## 2019-07-17 DIAGNOSIS — M2550 Pain in unspecified joint: Secondary | ICD-10-CM | POA: Insufficient documentation

## 2019-07-17 DIAGNOSIS — Z885 Allergy status to narcotic agent status: Secondary | ICD-10-CM | POA: Insufficient documentation

## 2019-07-17 DIAGNOSIS — J1282 Pneumonia due to coronavirus disease 2019: Secondary | ICD-10-CM | POA: Diagnosis not present

## 2019-07-17 DIAGNOSIS — Z7289 Other problems related to lifestyle: Secondary | ICD-10-CM | POA: Diagnosis not present

## 2019-07-17 DIAGNOSIS — Z170 Estrogen receptor positive status [ER+]: Secondary | ICD-10-CM | POA: Diagnosis not present

## 2019-07-17 DIAGNOSIS — Z87891 Personal history of nicotine dependence: Secondary | ICD-10-CM | POA: Diagnosis not present

## 2019-07-17 LAB — CBC WITH DIFFERENTIAL/PLATELET
Abs Immature Granulocytes: 0.02 10*3/uL (ref 0.00–0.07)
Basophils Absolute: 0.1 10*3/uL (ref 0.0–0.1)
Basophils Relative: 1 %
Eosinophils Absolute: 0.2 10*3/uL (ref 0.0–0.5)
Eosinophils Relative: 4 %
HCT: 43.9 % (ref 36.0–46.0)
Hemoglobin: 14.3 g/dL (ref 12.0–15.0)
Immature Granulocytes: 0 %
Lymphocytes Relative: 32 %
Lymphs Abs: 2 10*3/uL (ref 0.7–4.0)
MCH: 31.2 pg (ref 26.0–34.0)
MCHC: 32.6 g/dL (ref 30.0–36.0)
MCV: 95.9 fL (ref 80.0–100.0)
Monocytes Absolute: 0.6 10*3/uL (ref 0.1–1.0)
Monocytes Relative: 10 %
Neutro Abs: 3.3 10*3/uL (ref 1.7–7.7)
Neutrophils Relative %: 53 %
Platelets: 270 10*3/uL (ref 150–400)
RBC: 4.58 MIL/uL (ref 3.87–5.11)
RDW: 13.8 % (ref 11.5–15.5)
WBC: 6.2 10*3/uL (ref 4.0–10.5)
nRBC: 0 % (ref 0.0–0.2)

## 2019-07-17 LAB — COMPREHENSIVE METABOLIC PANEL
ALT: 20 U/L (ref 0–44)
AST: 20 U/L (ref 15–41)
Albumin: 4.2 g/dL (ref 3.5–5.0)
Alkaline Phosphatase: 71 U/L (ref 38–126)
Anion gap: 10 (ref 5–15)
BUN: 15 mg/dL (ref 8–23)
CO2: 29 mmol/L (ref 22–32)
Calcium: 9.9 mg/dL (ref 8.9–10.3)
Chloride: 102 mmol/L (ref 98–111)
Creatinine, Ser: 0.71 mg/dL (ref 0.44–1.00)
GFR calc Af Amer: >60 mL/min (ref >60)
GFR calc non Af Amer: >60 mL/min (ref >60)
Glucose, Bld: 79 mg/dL (ref 70–99)
Potassium: 4.3 mmol/L (ref 3.5–5.1)
Sodium: 141 mmol/L (ref 135–145)
Total Bilirubin: 0.7 mg/dL (ref 0.3–1.2)
Total Protein: 7.1 g/dL (ref 6.5–8.1)

## 2019-07-17 NOTE — Progress Notes (Signed)
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  Interim patient was diagnosed with COVID-19/pneumonia.  She is recovering fairly well.  Energy levels are improving.  She is not needing any oxygen supplementation.  She is eager to come off Aromasin as it is about 5 years.   Review of Systems  Constitutional: Positive for malaise/fatigue. Negative for chills, diaphoresis, fever and weight loss.  HENT: Negative for nosebleeds and sore throat.   Eyes: Negative for double vision.  Respiratory: Negative for cough, hemoptysis, sputum production, shortness of breath and wheezing.   Cardiovascular: Negative for chest pain, palpitations, orthopnea and leg swelling.  Gastrointestinal: Negative for abdominal pain, blood in stool, constipation, diarrhea, heartburn, melena, nausea and vomiting.  Genitourinary: Negative for dysuria, frequency and urgency.   Musculoskeletal: Positive for joint pain. Negative for back pain.  Skin: Negative.  Negative for itching and rash.  Neurological: Negative for dizziness, tingling, focal weakness, weakness and headaches.  Endo/Heme/Allergies: Does not bruise/bleed easily.  Psychiatric/Behavioral: Negative for depression. The patient is not nervous/anxious and does not have insomnia.     PAST MEDICAL HISTORY :  Past Medical History:  Diagnosis Date  . Breast cancer ([place]) 2015   left lumpectomy 04/05/2014 and reexcision 04/27/2014 - radiation treatment  . History of COVID-19   . Hypertension   . Personal history of radiation therapy 2016   Left breast    PAST SURGICAL HISTORY :   Past Surgical History:  Procedure Laterality Date  . ABDOMINAL HYSTERECTOMY    . BREAST BIOPSY Left 03/15/2014   8:00 [place] bx, SCLEROSING ADENOSIS AND COLUMNAR CELL CHANGE   . BREAST BIOPSY Left 02/27/2014   invasive lobular carcinoma LIQ  . BREAST LUMPECTOMY Left 04/03/2014   [facility], DCIS, [facility], negative LN  . BREAST LUMPECTOMY Left 04/27/2014   re-excision for clear margins  . COMBINED HYSTEROSCOPY DIAGNOSTIC / D&C    . PARTIAL MASTECTOMY WITH AXILLARY SENTINEL LYMPH NODE BIOPSY Left     FAMILY HISTORY :   Family History  Problem Relation Age of Onset  . Breast cancer Neg Hx     SOCIAL HISTORY:   Social History   Tobacco Use  . Smoking status: Former Smoker    Packs/day: 1.50    Types: Cigarettes  . Smokeless tobacco: Never Used  Substance Use Topics  . Alcohol use: Yes    Alcohol/week: 2.0 standard drinks    Types: 2 Glasses of wine per week  . Drug use: Not on file  ALLERGIES:  is allergic to acetaminophen; codeine; codone [hydrocodone]; oxycodone hcl; percocet [oxycodone-acetaminophen]; and penicillins.  MEDICATIONS:  Current Outpatient Medications  Medication Sig Dispense Refill  . aspirin EC 81 MG tablet Take 81 mg by mouth daily.    . Calcium-Vitamin D 600-200 MG-UNIT per tablet Take by mouth.     Marland Kitchen exemestane (AROMASIN) 25 MG tablet TAKE ONE TABLET BY MOUTH DAILY AFTER BREAKFAST 90 tablet 3  . exemestane (AROMASIN) 25 MG tablet Take 1 tablet (25 mg total) by mouth daily after breakfast. 90 tablet 0  . lisinopril-hydrochlorothiazide (ZESTORETIC) 10-12.5 MG tablet     . vitamin C (ASCORBIC ACID) 500 MG tablet Take 500 mg by mouth daily.    Marland Kitchen zinc gluconate 50 MG tablet Take 50 mg by mouth daily.     No current facility-administered medications for this visit.    PHYSICAL EXAMINATION: ECOG PERFORMANCE STATUS: 0 - Asymptomatic  BP 132/77 (BP Location: Left Arm, Patient Position: Sitting)   Pulse 75   Temp (!) 97.1 F (36.2 C) (Tympanic)   Resp 16   Wt 136 lb 3.2 oz (61.8 kg)   SpO2 100%   BMI 24.51 kg/m   Filed Weights   07/17/19 1026  Weight: 136 lb 3.2 oz (61.8 kg)    Physical Exam  Constitutional: She is oriented to person, place, and time and well-developed, well-nourished, and in no distress.  HENT:  Head: Normocephalic and atraumatic.  Mouth/Throat: Oropharynx is clear and moist. No oropharyngeal exudate.  Eyes: Pupils are equal, round, and reactive to light.  Cardiovascular: Normal rate and regular rhythm.  Pulmonary/Chest: Breath sounds normal. No respiratory distress. She has no wheezes.  Abdominal: Soft. Bowel sounds are normal. She exhibits no distension and no mass. There is no abdominal tenderness. There is no rebound and no guarding.  Musculoskeletal:        General: No tenderness or edema. Normal range of motion.     Cervical back: Normal range of motion and neck supple.  Neurological: She is alert and oriented to person, place, and time.  Skin: Skin is warm.  Psychiatric: Affect normal.      LABORATORY DATA:  I have reviewed the data as listed    Component Value Date/Time   NA 141 07/17/2019 1000   K 4.3 07/17/2019 1000   K 3.9 03/29/2014 1402   CL 102 07/17/2019 1000   CO2 29 07/17/2019 1000   GLUCOSE 79 07/17/2019 1000   BUN 15  07/17/2019 1000   CREATININE 0.71 07/17/2019 1000   CREATININE 0.74 07/17/2014 1005   CALCIUM 9.9 07/17/2019 1000   PROT 7.1 07/17/2019 1000   PROT 7.4 07/17/2014 1005   ALBUMIN 4.2 07/17/2019 1000   ALBUMIN 4.4 07/17/2014 1005   AST 20 07/17/2019 1000   AST 20 07/17/2014 1005   ALT 20 07/17/2019 1000   ALT 16 07/17/2014 1005   ALKPHOS 71 07/17/2019 1000   ALKPHOS 70 07/17/2014 1005   BILITOT 0.7 07/17/2019 1000   BILITOT 0.7 07/17/2014 1005   GFRNONAA >60 07/17/2019 1000   GFRNONAA >60 07/17/2014 1005   GFRAA >60 07/17/2019 1000   GFRAA >60 07/17/2014 1005    No results found for: SPEP, UPEP  Lab Results  Component Value Date   WBC 6.2 07/17/2019   NEUTROABS 3.3 07/17/2019   HGB 14.3 07/17/2019   HCT 43.9 07/17/2019   MCV 95.9 07/17/2019   PLT 270 07/17/2019      Chemistry      Component Value  Date/Time   NA 141 07/17/2019 1000   K 4.3 07/17/2019 1000   K 3.9 03/29/2014 1402   CL 102 07/17/2019 1000   CO2 29 07/17/2019 1000   BUN 15 07/17/2019 1000   CREATININE 0.71 07/17/2019 1000   CREATININE 0.74 07/17/2014 1005      Component Value Date/Time   CALCIUM 9.9 07/17/2019 1000   ALKPHOS 71 07/17/2019 1000   ALKPHOS 70 07/17/2014 1005   AST 20 07/17/2019 1000   AST 20 07/17/2014 1005   ALT 20 07/17/2019 1000   ALT 16 07/17/2014 1005   BILITOT 0.7 07/17/2019 1000   BILITOT 0.7 07/17/2014 1005       RADIOGRAPHIC STUDIES: I have personally reviewed the radiological images as listed and agreed with the findings in the report. No results found.   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 Intermediate risk Oncotype. She declined chemotherapy. Currently on Aromasin.  Mammogram January 2021 no evidence of recurrence.  #Discussed continuing Aromasin up to 10 years; versus stopping at 5 years [summer  2021].  Patient is inclined to stopping at 5-year mark.  I think is reasonable.   # OSTEOPENIA- [BMD= T score- 1.4]; continue ca+ vit D; recommend Yoga.   # COVID- 19 infection- [Jan [id number]- improving.   #Discussed regarding follow-up: patient was to follow-up with PCP moving forward.  Recommend annual mammogram; breast exams; tumor marker CA 27-29 annual basis.  She will call as needed.  DISPOSITION: mychart messg re: TM # Follow up with PCP; follow up with [place] as needed-Dr.B     Cammie Sickle, MD 07/17/2019 12:51 PM

## 2019-07-17 NOTE — Assessment & Plan Note (Addendum)
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 Intermediate risk Oncotype. She declined chemotherapy. Currently on Aromasin.  Mammogram January 2021 no evidence of recurrence.  #Discussed continuing Aromasin up to 10 years; versus stopping at 5 years [summer 2021].  Patient is inclined to stopping at 5-year mark.  I think is reasonable.   # OSTEOPENIA- [BMD= T score- 1.4]; continue ca+ vit D; recommend Yoga.   # COVID- 19 infection- [Jan [id number]- improving.   #Discussed regarding follow-up: patient was to follow-up with PCP moving forward.  Recommend annual mammogram; breast exams; tumor marker CA 27-29 annual basis.  She will call as needed.  DISPOSITION: mychart messg re: TM # Follow up with PCP; follow up with [place] as needed-Dr.B

## 2019-07-17 NOTE — Progress Notes (Signed)
Pt in for follow up, states had covid and pneumonia in January.  States had mammogram recently. RN called for pre assessment and pt denies any changes or concerns.

## 2019-07-18 LAB — CANCER ANTIGEN 27.29: CA 27.29: 49.3 U/mL — ABNORMAL HIGH (ref 0.0–38.6)

## 2019-07-19 NOTE — Progress Notes (Signed)
Ca 27.29 is slightly up. Just wanted to let you know. Are we concerned?  Faythe Casa, NP 07/19/2019 10:53 AM

## 2019-07-20 ENCOUNTER — Telehealth: Payer: Self-pay | Admitting: Internal Medicine

## 2019-07-20 ENCOUNTER — Telehealth: Payer: Self-pay | Admitting: *Deleted

## 2019-07-20 DIAGNOSIS — R978 Other abnormal tumor markers: Secondary | ICD-10-CM

## 2019-07-20 DIAGNOSIS — C50312 Malignant neoplasm of lower-inner quadrant of left female breast: Secondary | ICD-10-CM

## 2019-07-20 NOTE — Telephone Encounter (Signed)
Phone encounter opened in error

## 2019-07-20 NOTE — Telephone Encounter (Signed)
On 4/21-spoke to patient regarding the results of the elevated tumor marker. Recommend CT scan of the chest and pelvis with contrast; bone scan.  Patient doing better from this week; returning May 4  M-please schedule CT scans/bone scan-around May 6/patient preference  # Follow-up-MD; video visit-1 to 2 days later.

## 2019-07-22 ENCOUNTER — Other Ambulatory Visit: Payer: Self-pay

## 2019-07-22 ENCOUNTER — Ambulatory Visit: Payer: Medicare PPO | Attending: Internal Medicine

## 2019-07-22 DIAGNOSIS — Z23 Encounter for immunization: Secondary | ICD-10-CM

## 2019-07-22 NOTE — Progress Notes (Signed)
   Covid-19 Vaccination Clinic  Name:  Nadira Ambrosino    MRN: [id number] DOB: [birth year]  07/22/2019  Ms. Keziah was observed post Covid-19 immunization for 15 minutes without incident. She was provided with Vaccine Information Sheet and instruction to access the V-Safe system.   Ms. Surridge was instructed to call 911 with any severe reactions post vaccine: Marland Kitchen Difficulty breathing  . Swelling of face and throat  . A fast heartbeat  . A bad rash all over body  . Dizziness and weakness   Immunizations Administered    Name Date Dose VIS Date Route   Pfizer COVID-19 Vaccine 07/22/2019  8:31 AM 0.3 mL 05/24/2018 Intramuscular   Manufacturer: [place]   Lot: [id number]   [place]: [id number]

## 2019-08-07 ENCOUNTER — Other Ambulatory Visit: Payer: Self-pay

## 2019-08-07 ENCOUNTER — Ambulatory Visit
Admission: RE | Admit: 2019-08-07 | Discharge: 2019-08-07 | Disposition: A | Discharge status: Home / Self Care | Payer: Medicare PPO | Source: Ambulatory Visit | Attending: Internal Medicine | Admitting: Internal Medicine

## 2019-08-07 ENCOUNTER — Encounter (HOSPITAL_BASED_OUTPATIENT_CLINIC_OR_DEPARTMENT_OTHER)
Admission: RE | Admit: 2019-08-07 | Discharge: 2019-08-07 | Disposition: A | Discharge status: Home / Self Care | Payer: Medicare PPO | Source: Ambulatory Visit | Attending: Internal Medicine | Admitting: Internal Medicine

## 2019-08-07 ENCOUNTER — Encounter
Admission: RE | Admit: 2019-08-07 | Discharge: 2019-08-07 | Disposition: A | Discharge status: Home / Self Care | Payer: Medicare PPO | Source: Ambulatory Visit | Attending: Internal Medicine | Admitting: Internal Medicine

## 2019-08-07 DIAGNOSIS — C50312 Malignant neoplasm of lower-inner quadrant of left female breast: Secondary | ICD-10-CM | POA: Diagnosis not present

## 2019-08-07 DIAGNOSIS — Z170 Estrogen receptor positive status [ER+]: Secondary | ICD-10-CM | POA: Diagnosis not present

## 2019-08-07 DIAGNOSIS — R978 Other abnormal tumor markers: Secondary | ICD-10-CM | POA: Diagnosis present

## 2019-08-07 MED ORDER — IOHEXOL 300 MG/ML  SOLN
100.0000 mL | Freq: Once | INTRAMUSCULAR | Status: AC | PRN
  Administered 2019-08-07: 11:00:00 100 mL via INTRAVENOUS

## 2019-08-07 MED ORDER — TECHNETIUM TC 99M MEDRONATE IV KIT
20.0000 | PACK | Freq: Once | INTRAVENOUS | Status: AC | PRN
  Administered 2019-08-07: 22.769 via INTRAVENOUS

## 2019-08-09 ENCOUNTER — Inpatient Hospital Stay: Payer: Medicare PPO | Attending: Internal Medicine | Admitting: Internal Medicine

## 2019-08-09 DIAGNOSIS — Z79811 Long term (current) use of aromatase inhibitors: Secondary | ICD-10-CM | POA: Diagnosis not present

## 2019-08-09 DIAGNOSIS — Z79899 Other long term (current) drug therapy: Secondary | ICD-10-CM | POA: Insufficient documentation

## 2019-08-09 DIAGNOSIS — Z170 Estrogen receptor positive status [ER+]: Secondary | ICD-10-CM | POA: Diagnosis not present

## 2019-08-09 DIAGNOSIS — M8580 Other specified disorders of bone density and structure, unspecified site: Secondary | ICD-10-CM | POA: Diagnosis not present

## 2019-08-09 DIAGNOSIS — C50312 Malignant neoplasm of lower-inner quadrant of left female breast: Secondary | ICD-10-CM | POA: Diagnosis not present

## 2019-08-09 NOTE — Progress Notes (Signed)
I connected with Dana Snow on 08/09/2019 at 10:15 AM EDT by video enabled telemedicine visit and verified that I am speaking with the correct person using two identifiers.  I discussed the limitations, risks, security and privacy concerns of performing an evaluation and management service by telemedicine and the availability of in-person appointments. I also discussed with the patient that there may be a patient responsible charge related to this service. The patient expressed understanding and agreed to proceed.    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  female with history of stage II breast cancer ER/PR positive HER-2 negative is here for follow-up to review the results of her CT scan/bone scan.  Patient noted to have elevated tumor marker for which she had scans done.  Patient denies any unusual shortness of breath or cough.  Denies any nausea vomiting.  Denies any headache.  Denies any worsening bone pain.  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 Intermediate risk Oncotype. She declined chemotherapy. Currently on Aromasin.  Mammogram 2021 within normal limits.  Clinically stable.  #Continue Aromasin for now; patient inclined to stop at 5 years ~summer 2021.  See discussion below  #Elevated tumor marker CA 27-29-May 2021-CT chest and pelvis/bone scan negative for any recurrence.  Discussed the option of PET scan vs. repeating tumor markers in 2 months.  Patient interested monitoring in 2 months.  # OSTEOPENIA- [BMD= T score- 1.4]; continue ca+ vit D; recommend Yoga.   DISPOSITION:   # follow up in 2 month; MD-Virtual; 2-3 days PRIOR ca-27-29 /Aa15-3; CEA-Dr.B  Follow-up instructions:  I discussed the assessment and treatment plan with the patient.  The patient was provided an opportunity to ask questions and all were answered.  The patient agreed with the plan and demonstrated understanding of instructions.  The patient was advised to call back or seek an in person evaluation if the symptoms worsen or if the condition fails to improve as anticipated.  Dr. Charlaine Dalton [facility] at [facility] 08/13/2019 5:20 PM

## 2019-08-09 NOTE — Assessment & Plan Note (Addendum)
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 Intermediate risk Oncotype. She declined chemotherapy. Currently on Aromasin.  Mammogram 2021 within normal limits.  Clinically stable.  #Continue Aromasin for now; patient inclined to stop at 5 years ~summer 2021.  See discussion below  #Elevated tumor marker CA 27-29-May 2021-CT chest and pelvis/bone scan negative for any recurrence.  Discussed the option of PET scan vs. repeating tumor markers in 2 months.  Patient interested monitoring in 2 months.  # OSTEOPENIA- [BMD= T score- 1.4]; continue ca+ vit D; recommend Yoga.   DISPOSITION:   # follow up in 2 month; MD-Virtual; 2-3 days PRIOR ca-27-29 /Aa15-3; CEA-Dr.B

## 2019-08-10 ENCOUNTER — Telehealth: Payer: Medicare PPO | Admitting: Internal Medicine

## 2019-08-15 ENCOUNTER — Ambulatory Visit: Payer: Medicare PPO | Attending: Internal Medicine

## 2019-08-15 DIAGNOSIS — Z23 Encounter for immunization: Secondary | ICD-10-CM

## 2019-08-15 NOTE — Progress Notes (Signed)
   Covid-19 Vaccination Clinic  Name:  Dana Snow    MRN: [id number] DOB: 04/10/1945  08/15/2019  [name] was observed post Covid-19 immunization for 15 minutes without incident. She was provided with Vaccine Information Sheet and instruction to access the V-Safe system.   [name] was instructed to call 911 with any severe reactions post vaccine: Marland Kitchen Difficulty breathing  . Swelling of face and throat  . A fast heartbeat  . A bad rash all over body  . Dizziness and weakness   Immunizations Administered    Name Date Dose VIS Date Route   Pfizer COVID-19 Vaccine 08/15/2019  8:32 AM 0.3 mL 05/24/2018 Intramuscular   Manufacturer: [place]   Lot: [id number]   [place]: [id number]

## 2019-10-09 ENCOUNTER — Other Ambulatory Visit: Payer: Self-pay

## 2019-10-09 ENCOUNTER — Inpatient Hospital Stay: Payer: Medicare PPO | Attending: Internal Medicine

## 2019-10-09 DIAGNOSIS — C50312 Malignant neoplasm of lower-inner quadrant of left female breast: Secondary | ICD-10-CM

## 2019-10-09 DIAGNOSIS — Z170 Estrogen receptor positive status [ER+]: Secondary | ICD-10-CM | POA: Insufficient documentation

## 2019-10-10 LAB — CANCER ANTIGEN 15-3: CA 15-3: 22.2 U/mL (ref 0.0–25.0)

## 2019-10-10 LAB — CANCER ANTIGEN 27.29: CA 27.29: 26.2 U/mL (ref 0.0–38.6)

## 2019-10-10 LAB — CEA: CEA: 3.5 ng/mL (ref 0.0–4.7)

## 2019-10-11 ENCOUNTER — Inpatient Hospital Stay (HOSPITAL_BASED_OUTPATIENT_CLINIC_OR_DEPARTMENT_OTHER): Payer: Medicare PPO | Admitting: Internal Medicine

## 2019-10-11 ENCOUNTER — Other Ambulatory Visit: Payer: Self-pay

## 2019-10-11 DIAGNOSIS — C50312 Malignant neoplasm of lower-inner quadrant of left female breast: Secondary | ICD-10-CM | POA: Diagnosis not present

## 2019-10-11 DIAGNOSIS — Z170 Estrogen receptor positive status [ER+]: Secondary | ICD-10-CM

## 2019-10-11 NOTE — Assessment & Plan Note (Addendum)
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 Intermediate risk Oncotype; declined chemotherapy.  Finished 5 years of Aromasin summer 2021.  Currently on surveillance.  Stable.  # Elevated tumor marker CA 27-29-May 2021-CT chest and pelvis/bone scan negative for any recurrence; however repeat tumor markers-July 2021; within normal limits.  I would not recommend a PET scan at this time.  However I would repeat tumor markers in 6 months.  Patient will call us if having any abnormal symptoms/worsening bone pain-we will repeat work-up sooner.  # OSTEOPENIA- [BMD= T score- 1.4]; continue ca+ vit D; recommend Yoga.   DISPOSITION:   # follow up in 6 month; MD-;  2-3 days PRIOR labs-ca-27-29 /Aa15-3; CEA/cbc/cmp-Dr.B

## 2019-10-11 NOTE — Progress Notes (Signed)
I connected with Dana Snow on 10/11/19 at  3:30 PM EDT by video enabled telemedicine visit and verified that I am speaking with the correct person using two identifiers.  I discussed the limitations, risks, security and privacy concerns of performing an evaluation and management service by telemedicine and the availability of in-person appointments. I also discussed with the patient that there may be a patient responsible charge related to this service. The patient expressed understanding and agreed to proceed.    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 Smith]]  ER/PR- >90%; Her 2 Nue- NEG; Lumpec & SLNBx Oncotype-RS=19 No Chemo; s/p RT [Dr.Crystal] April 2016- Arimidex; Stopped Oct 2016; START OCT 2016- Femara; Sep 19th stopped sec to [employer]	[place]; sep 19th 2019- START AROMASIN; stop summer 2021.  # Hot flashes- resolved  # BMD- Osteopenia [may 2016]; AUG 2018- T score= -1.1 [improved] ------------------------------------------------------------   DIAGNOSIS: BREAST CANCER   STAGE:    II     ;GOALS: Cure  CURRENT/MOST RECENT THERAPY: Surveillance.    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  female with history of history of breast cancer ER/PR positive HER-2 negative currently on surveillance is here for follow-up to review the results of her tumor markers.  Patient continues to do well.  Denies any nausea vomiting abdominal pain denies any bone pain.  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 Intermediate risk Oncotype; declined chemotherapy.  Finished 5 years of Aromasin summer 2021.  Currently on surveillance.  Stable.  # Elevated tumor marker CA 27-29-May 2021-CT chest and pelvis/bone scan negative for any recurrence; however repeat tumor markers-July 2021; within normal limits.  I would not recommend a PET scan at this time.  However I would repeat tumor markers in 6 months.  Patient will call us if having any abnormal symptoms/worsening bone pain-we will repeat work-up sooner.  # OSTEOPENIA- [BMD= T score- 1.4]; continue ca+ vit D; recommend Yoga.   DISPOSITION:   # follow up in 6 month; MD-;  2-3 days PRIOR labs-ca-27-29 /Aa15-3; CEA/cbc/cmp-Dr.B  Follow-up instructions:  I discussed the assessment and treatment plan with the patient.  The patient was provided an opportunity to ask questions and all were answered.  The patient agreed with the plan and demonstrated understanding of instructions.  The patient was advised to call back or seek an in person evaluation if the symptoms worsen or if the condition fails to improve as anticipated.  Dr. Charlaine Dalton [place] at [facility] 10/11/2019 8:00 PM

## 2020-03-01 ENCOUNTER — Encounter: Payer: Self-pay | Admitting: Internal Medicine

## 2020-03-01 ENCOUNTER — Other Ambulatory Visit: Payer: Self-pay | Admitting: Physician Assistant

## 2020-03-01 DIAGNOSIS — Z1231 Encounter for screening mammogram for malignant neoplasm of breast: Secondary | ICD-10-CM

## 2020-04-04 ENCOUNTER — Other Ambulatory Visit: Payer: Self-pay | Admitting: *Deleted

## 2020-04-04 DIAGNOSIS — Z170 Estrogen receptor positive status [ER+]: Secondary | ICD-10-CM

## 2020-04-04 DIAGNOSIS — R978 Other abnormal tumor markers: Secondary | ICD-10-CM

## 2020-04-04 DIAGNOSIS — C50312 Malignant neoplasm of lower-inner quadrant of left female breast: Secondary | ICD-10-CM

## 2020-04-08 ENCOUNTER — Inpatient Hospital Stay: Payer: Medicare PPO | Attending: Internal Medicine

## 2020-04-08 DIAGNOSIS — M8580 Other specified disorders of bone density and structure, unspecified site: Secondary | ICD-10-CM | POA: Diagnosis not present

## 2020-04-08 DIAGNOSIS — M2550 Pain in unspecified joint: Secondary | ICD-10-CM | POA: Diagnosis not present

## 2020-04-08 DIAGNOSIS — Z79899 Other long term (current) drug therapy: Secondary | ICD-10-CM | POA: Insufficient documentation

## 2020-04-08 DIAGNOSIS — Z170 Estrogen receptor positive status [ER+]: Secondary | ICD-10-CM | POA: Diagnosis not present

## 2020-04-08 DIAGNOSIS — Z87891 Personal history of nicotine dependence: Secondary | ICD-10-CM | POA: Insufficient documentation

## 2020-04-08 DIAGNOSIS — R978 Other abnormal tumor markers: Secondary | ICD-10-CM

## 2020-04-08 DIAGNOSIS — Z885 Allergy status to narcotic agent status: Secondary | ICD-10-CM | POA: Diagnosis not present

## 2020-04-08 DIAGNOSIS — Z923 Personal history of irradiation: Secondary | ICD-10-CM | POA: Diagnosis not present

## 2020-04-08 DIAGNOSIS — Z8616 Personal history of COVID-19: Secondary | ICD-10-CM | POA: Insufficient documentation

## 2020-04-08 DIAGNOSIS — Z880 Allergy status to penicillin: Secondary | ICD-10-CM | POA: Insufficient documentation

## 2020-04-08 DIAGNOSIS — R5383 Other fatigue: Secondary | ICD-10-CM | POA: Diagnosis not present

## 2020-04-08 DIAGNOSIS — C50312 Malignant neoplasm of lower-inner quadrant of left female breast: Secondary | ICD-10-CM

## 2020-04-08 LAB — COMPREHENSIVE METABOLIC PANEL
ALT: 18 U/L (ref 0–44)
AST: 22 U/L (ref 15–41)
Albumin: 4.1 g/dL (ref 3.5–5.0)
Alkaline Phosphatase: 62 U/L (ref 38–126)
Anion gap: 9 (ref 5–15)
BUN: 15 mg/dL (ref 8–23)
CO2: 27 mmol/L (ref 22–32)
Calcium: 9.2 mg/dL (ref 8.9–10.3)
Chloride: 98 mmol/L (ref 98–111)
Creatinine, Ser: 0.79 mg/dL (ref 0.44–1.00)
GFR, Estimated: >60 mL/min (ref >60)
Glucose, Bld: 140 mg/dL — ABNORMAL HIGH (ref 70–99)
Potassium: 4.3 mmol/L (ref 3.5–5.1)
Sodium: 134 mmol/L — ABNORMAL LOW (ref 135–145)
Total Bilirubin: 0.8 mg/dL (ref 0.3–1.2)
Total Protein: 7.1 g/dL (ref 6.5–8.1)

## 2020-04-08 LAB — CBC WITH DIFFERENTIAL/PLATELET
Abs Immature Granulocytes: 0.01 10*3/uL (ref 0.00–0.07)
Basophils Absolute: 0.1 10*3/uL (ref 0.0–0.1)
Basophils Relative: 1 %
Eosinophils Absolute: 0.2 10*3/uL (ref 0.0–0.5)
Eosinophils Relative: 3 %
HCT: 42.4 % (ref 36.0–46.0)
Hemoglobin: 14.1 g/dL (ref 12.0–15.0)
Immature Granulocytes: 0 %
Lymphocytes Relative: 38 %
Lymphs Abs: 2.6 10*3/uL (ref 0.7–4.0)
MCH: 31.1 pg (ref 26.0–34.0)
MCHC: 33.3 g/dL (ref 30.0–36.0)
MCV: 93.4 fL (ref 80.0–100.0)
Monocytes Absolute: 0.5 10*3/uL (ref 0.1–1.0)
Monocytes Relative: 7 %
Neutro Abs: 3.6 10*3/uL (ref 1.7–7.7)
Neutrophils Relative %: 51 %
Platelets: 246 10*3/uL (ref 150–400)
RBC: 4.54 MIL/uL (ref 3.87–5.11)
RDW: 12.6 % (ref 11.5–15.5)
WBC: 7 10*3/uL (ref 4.0–10.5)
nRBC: 0 % (ref 0.0–0.2)

## 2020-04-09 LAB — CEA: CEA: 2.8 ng/mL (ref 0.0–4.7)

## 2020-04-09 LAB — CANCER ANTIGEN 27.29: CA 27.29: 27.8 U/mL (ref 0.0–38.6)

## 2020-04-09 LAB — CANCER ANTIGEN 15-3: CA 15-3: 22.7 U/mL (ref 0.0–25.0)

## 2020-04-10 ENCOUNTER — Other Ambulatory Visit: Payer: Self-pay

## 2020-04-10 ENCOUNTER — Inpatient Hospital Stay: Payer: Medicare PPO | Admitting: Internal Medicine

## 2020-04-10 DIAGNOSIS — C50312 Malignant neoplasm of lower-inner quadrant of left female breast: Secondary | ICD-10-CM | POA: Diagnosis not present

## 2020-04-10 DIAGNOSIS — Z170 Estrogen receptor positive status [ER+]: Secondary | ICD-10-CM | POA: Diagnosis not present

## 2020-04-10 NOTE — Assessment & Plan Note (Addendum)
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 Intermediate risk Oncotype; declined chemotherapy.  Finished 5 years of Aromasin summer 2021.  Currently on surveillance.  STABLE. Mammogram with PCP.   # Elevated tumor marker CA 27-29-May 2021-CT chest and pelvis/bone scan negative for any recurrence; however repeat tumor markers-July 2021; within normal limits; JAN 2022-again WNL.  Would not recommend any surveillance tumor markers.   # OSTEOPENIA- [BMD= T score- 1.4]; continue ca+ vit D; exercise.  Recommend bone density test in 2 years.  Defer to PCP  #Patient feels comfortable following up with PCP.  Will follow-up with [place] only as needed.  DISPOSITION:   # follow up as needed- Dr.B  Cc;

## 2020-04-10 NOTE — Progress Notes (Unsigned)
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 Smith]]  ER/PR- >90%; Her 2 Nue- NEG; Lumpec & SLNBx Oncotype-RS=19 No Chemo; s/p RT [Dr.Crystal] April 2016- Arimidex; Stopped Oct 2016; START OCT 2016- Femara; Sep 19th stopped sec to [employer]	[place]; sep 19th 2019- START AROMASIN; stop summer 2021.  # Hot flashes- resolved  # BMD- Osteopenia [may 2016]; AUG 2018- T score= -1.1 [improved] ------------------------------------------------------------   DIAGNOSIS: BREAST CANCER   STAGE:    II     ;GOALS: Cure  CURRENT/MOST RECENT THERAPY: Surveillance.    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  Denies any unusual bone pain or back pain.  Appetite is good with no weight loss.  No new shortness of breath or cough.   Review of Systems  Constitutional: Positive for malaise/fatigue. Negative for chills, diaphoresis, fever and weight loss.  HENT: Negative for nosebleeds and sore throat.   Eyes: Negative for double vision.  Respiratory: Negative for cough, hemoptysis, sputum production, shortness of breath and wheezing.   Cardiovascular: Negative for chest pain, palpitations, orthopnea and leg swelling.  Gastrointestinal: Negative for abdominal  pain, blood in stool, constipation, diarrhea, heartburn, melena, nausea and vomiting.  Genitourinary: Negative for dysuria, frequency and urgency.  Musculoskeletal: Positive for joint pain. Negative for back pain.  Skin: Negative.  Negative for itching and rash.  Neurological: Negative for dizziness, tingling, focal weakness, weakness and headaches.  Endo/Heme/Allergies: Does not bruise/bleed easily.  Psychiatric/Behavioral: Negative for depression. The patient is not nervous/anxious and does not have insomnia.     PAST MEDICAL HISTORY :  Past Medical History:  Diagnosis Date  . Breast cancer ([place]) 2015   left lumpectomy 04/05/2014 and reexcision 04/27/2014 - radiation treatment  . History of COVID-19   . Hypertension   . Personal history of radiation therapy 2016   Left breast    PAST SURGICAL HISTORY :   Past Surgical History:  Procedure Laterality Date  . ABDOMINAL HYSTERECTOMY    . BREAST BIOPSY Left 03/15/2014   8:00 [place] bx, SCLEROSING ADENOSIS AND COLUMNAR CELL CHANGE   . BREAST BIOPSY Left 02/27/2014   invasive lobular carcinoma LIQ  . BREAST LUMPECTOMY Left 04/03/2014   [facility], DCIS, ALH, negative LN  . BREAST LUMPECTOMY Left 04/27/2014   re-excision for clear margins  . COMBINED HYSTEROSCOPY DIAGNOSTIC / D&C    . PARTIAL MASTECTOMY WITH AXILLARY SENTINEL LYMPH NODE BIOPSY Left     FAMILY HISTORY :   Family History  Problem Relation Age of Onset  . Breast cancer Neg Hx     SOCIAL HISTORY:   Social History   Tobacco Use  . Smoking status: Former Smoker    Packs/day: 1.50    Types: Cigarettes  . Smokeless tobacco: Never Used  Substance Use Topics  . Alcohol use:  Yes    Alcohol/week: 2.0 standard drinks    Types: 2 Glasses of wine per week    ALLERGIES:  is allergic to acetaminophen, codeine, codone [hydrocodone], oxycodone hcl, percocet [oxycodone-acetaminophen], and penicillins.  MEDICATIONS:  Current Outpatient Medications  Medication Sig Dispense Refill   . Calcium-Vitamin D 600-200 MG-UNIT per tablet Take by mouth.    Marland Kitchen lisinopril-hydrochlorothiazide (ZESTORETIC) 10-12.5 MG tablet     . vitamin C (ASCORBIC ACID) 500 MG tablet Take 500 mg by mouth daily.    Marland Kitchen aspirin EC 81 MG tablet Take 81 mg by mouth daily. (Patient not taking: Reported on 04/10/2020)    . exemestane (AROMASIN) 25 MG tablet TAKE ONE TABLET BY MOUTH DAILY AFTER BREAKFAST (Patient not taking: Reported on 04/10/2020) 90 tablet 3  . exemestane (AROMASIN) 25 MG tablet Take 1 tablet (25 mg total) by mouth daily after breakfast. (Patient not taking: No sig reported) 90 tablet 0  . zinc gluconate 50 MG tablet Take 50 mg by mouth daily. (Patient not taking: No sig reported)     No current facility-administered medications for this visit.    PHYSICAL EXAMINATION: ECOG PERFORMANCE STATUS: 0 - Asymptomatic  BP 135/79 (BP Location: Right Arm, Patient Position: Sitting)   Pulse 84   Temp 97.8 F (36.6 C) (Tympanic)   Wt 141 lb (64 kg)   SpO2 99%   BMI 25.38 kg/m   Filed Weights   04/10/20 1429  Weight: 141 lb (64 kg)    Physical Exam HENT:     Head: Normocephalic and atraumatic.     Mouth/Throat:     Pharynx: No oropharyngeal exudate.  Eyes:     Pupils: Pupils are equal, round, and reactive to light.  Cardiovascular:     Rate and Rhythm: Normal rate and regular rhythm.  Pulmonary:     Effort: No respiratory distress.     Breath sounds: Normal breath sounds. No wheezing.  Abdominal:     General: Bowel sounds are normal. There is no distension.     Palpations: Abdomen is soft. There is no mass.     Tenderness: There is no abdominal tenderness. There is no guarding or rebound.  Musculoskeletal:        General: No tenderness. Normal range of motion.     Cervical back: Normal range of motion and neck supple.  Skin:    General: Skin is warm.  Neurological:     Mental Status: She is alert and oriented to person, place, and time.  Psychiatric:        Mood and Affect:  Affect normal.       LABORATORY DATA:  I have reviewed the data as listed    Component Value Date/Time   NA 134 (L) 04/08/2020 1342   K 4.3 04/08/2020 1342   K 3.9 03/29/2014 1402   CL 98 04/08/2020 1342   CO2 27 04/08/2020 1342   GLUCOSE 140 (H) 04/08/2020 1342   BUN 15 04/08/2020 1342   CREATININE 0.79 04/08/2020 1342   CREATININE 0.74 07/17/2014 1005   CALCIUM 9.2 04/08/2020 1342   PROT 7.1 04/08/2020 1342   PROT 7.4 07/17/2014 1005   ALBUMIN 4.1 04/08/2020 1342   ALBUMIN 4.4 07/17/2014 1005   AST 22 04/08/2020 1342   AST 20 07/17/2014 1005   ALT 18 04/08/2020 1342   ALT 16 07/17/2014 1005   ALKPHOS 62 04/08/2020 1342   ALKPHOS 70 07/17/2014 1005   BILITOT 0.8 04/08/2020 1342   BILITOT 0.7 07/17/2014 1005  GFRNONAA >60 04/08/2020 1342   GFRNONAA >60 07/17/2014 1005   GFRAA >60 07/17/2019 1000   GFRAA >60 07/17/2014 1005    No results found for: SPEP, UPEP  Lab Results  Component Value Date   WBC 7.0 04/08/2020   NEUTROABS 3.6 04/08/2020   HGB 14.1 04/08/2020   HCT 42.4 04/08/2020   MCV 93.4 04/08/2020   PLT 246 04/08/2020      Chemistry      Component Value Date/Time   NA 134 (L) 04/08/2020 1342   K 4.3 04/08/2020 1342   K 3.9 03/29/2014 1402   CL 98 04/08/2020 1342   CO2 27 04/08/2020 1342   BUN 15 04/08/2020 1342   CREATININE 0.79 04/08/2020 1342   CREATININE 0.74 07/17/2014 1005      Component Value Date/Time   CALCIUM 9.2 04/08/2020 1342   ALKPHOS 62 04/08/2020 1342   ALKPHOS 70 07/17/2014 1005   AST 22 04/08/2020 1342   AST 20 07/17/2014 1005   ALT 18 04/08/2020 1342   ALT 16 07/17/2014 1005   BILITOT 0.8 04/08/2020 1342   BILITOT 0.7 07/17/2014 1005       RADIOGRAPHIC STUDIES: I have personally reviewed the radiological images as listed and agreed with the findings in the report. No results found.   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 Intermediate risk Oncotype; declined chemotherapy.  Finished 5 years of Aromasin summer 2021.  Currently on surveillance.  STABLE. Mammogram with PCP.   # Elevated tumor marker CA 27-29-May 2021-CT chest and pelvis/bone scan negative for any recurrence; however repeat tumor markers-July 2021; within normal limits; JAN 2022-again WNL.  Would not recommend any surveillance tumor markers.   # OSTEOPENIA- [BMD= T score- 1.4]; continue ca+ vit D; exercise.  Recommend bone density test in 2 years.  Defer to PCP  #Patient feels comfortable following up with PCP.  Will follow-up with [place] only as needed.  DISPOSITION:   # follow up as needed- Dr.B  Cc;      Cammie Sickle, MD 04/12/2020 7:03 AM

## 2020-04-11 ENCOUNTER — Ambulatory Visit
Admission: RE | Admit: 2020-04-11 | Discharge: 2020-04-11 | Disposition: A | Discharge status: Home / Self Care | Payer: Medicare PPO | Source: Ambulatory Visit | Attending: Physician Assistant | Admitting: Physician Assistant

## 2020-04-11 ENCOUNTER — Other Ambulatory Visit: Payer: Self-pay

## 2020-04-11 DIAGNOSIS — Z1231 Encounter for screening mammogram for malignant neoplasm of breast: Secondary | ICD-10-CM

## 2021-02-12 ENCOUNTER — Other Ambulatory Visit: Payer: Self-pay | Admitting: Physician Assistant

## 2021-02-12 DIAGNOSIS — Z1231 Encounter for screening mammogram for malignant neoplasm of breast: Secondary | ICD-10-CM

## 2021-03-25 ENCOUNTER — Other Ambulatory Visit: Payer: Self-pay | Admitting: Physician Assistant

## 2021-03-25 DIAGNOSIS — Z780 Asymptomatic menopausal state: Secondary | ICD-10-CM

## 2021-05-07 ENCOUNTER — Ambulatory Visit
Admission: RE | Admit: 2021-05-07 | Discharge: 2021-05-07 | Disposition: A | Discharge status: Home / Self Care | Payer: Medicare PPO | Source: Ambulatory Visit | Attending: Physician Assistant | Admitting: Physician Assistant

## 2021-05-07 ENCOUNTER — Other Ambulatory Visit: Payer: Self-pay

## 2021-05-07 DIAGNOSIS — Z1231 Encounter for screening mammogram for malignant neoplasm of breast: Secondary | ICD-10-CM | POA: Insufficient documentation

## 2022-02-24 ENCOUNTER — Other Ambulatory Visit: Payer: Self-pay

## 2022-02-24 DIAGNOSIS — Z1231 Encounter for screening mammogram for malignant neoplasm of breast: Secondary | ICD-10-CM

## 2022-05-08 ENCOUNTER — Ambulatory Visit
Admission: RE | Admit: 2022-05-08 | Discharge: 2022-05-08 | Disposition: A | Discharge status: Home / Self Care | Payer: Medicare PPO | Source: Ambulatory Visit | Attending: Physician Assistant | Admitting: Physician Assistant

## 2022-05-08 DIAGNOSIS — Z1231 Encounter for screening mammogram for malignant neoplasm of breast: Secondary | ICD-10-CM | POA: Diagnosis present

## 2022-05-10 LAB — EXTERNAL GENERIC LAB PROCEDURE: COLOGUARD: NEGATIVE

## 2022-05-10 LAB — COLOGUARD: COLOGUARD: NEGATIVE

## 2023-03-11 ENCOUNTER — Other Ambulatory Visit: Payer: Self-pay | Admitting: Physician Assistant

## 2023-03-11 DIAGNOSIS — Z1231 Encounter for screening mammogram for malignant neoplasm of breast: Secondary | ICD-10-CM

## 2023-05-10 ENCOUNTER — Ambulatory Visit
Admission: RE | Admit: 2023-05-10 | Discharge: 2023-05-10 | Disposition: A | Discharge status: Home / Self Care | Payer: Medicare PPO | Source: Ambulatory Visit | Attending: Physician Assistant | Admitting: Physician Assistant

## 2023-05-10 DIAGNOSIS — Z1231 Encounter for screening mammogram for malignant neoplasm of breast: Secondary | ICD-10-CM | POA: Insufficient documentation

## 2023-08-15 ENCOUNTER — Emergency Department

## 2023-08-15 ENCOUNTER — Other Ambulatory Visit: Payer: Self-pay

## 2023-08-15 ENCOUNTER — Other Ambulatory Visit
Admission: RE | Admit: 2023-08-15 | Discharge: 2023-08-15 | Disposition: A | Discharge status: Home / Self Care | Source: Ambulatory Visit | Attending: Family Medicine | Admitting: Family Medicine

## 2023-08-15 ENCOUNTER — Emergency Department
Admission: EM | Admit: 2023-08-15 | Discharge: 2023-08-15 | Disposition: A | Discharge status: Home / Self Care | Attending: Emergency Medicine | Admitting: Emergency Medicine

## 2023-08-15 DIAGNOSIS — J189 Pneumonia, unspecified organism: Secondary | ICD-10-CM | POA: Insufficient documentation

## 2023-08-15 DIAGNOSIS — Z87891 Personal history of nicotine dependence: Secondary | ICD-10-CM | POA: Insufficient documentation

## 2023-08-15 DIAGNOSIS — R0609 Other forms of dyspnea: Secondary | ICD-10-CM | POA: Insufficient documentation

## 2023-08-15 DIAGNOSIS — R000 Tachycardia, unspecified: Secondary | ICD-10-CM | POA: Insufficient documentation

## 2023-08-15 DIAGNOSIS — R051 Acute cough: Secondary | ICD-10-CM | POA: Diagnosis not present

## 2023-08-15 DIAGNOSIS — R0602 Shortness of breath: Secondary | ICD-10-CM | POA: Diagnosis present

## 2023-08-15 DIAGNOSIS — I10 Essential (primary) hypertension: Secondary | ICD-10-CM | POA: Diagnosis not present

## 2023-08-15 DIAGNOSIS — R5383 Other fatigue: Secondary | ICD-10-CM | POA: Insufficient documentation

## 2023-08-15 LAB — CBC WITH DIFFERENTIAL/PLATELET
Abs Immature Granulocytes: 0.02 10*3/uL (ref 0.00–0.07)
Basophils Absolute: 0 10*3/uL (ref 0.0–0.1)
Basophils Relative: 1 %
Eosinophils Absolute: 0.2 10*3/uL (ref 0.0–0.5)
Eosinophils Relative: 2 %
HCT: 47.5 % — ABNORMAL HIGH (ref 36.0–46.0)
Hemoglobin: 15.7 g/dL — ABNORMAL HIGH (ref 12.0–15.0)
Immature Granulocytes: 0 %
Lymphocytes Relative: 29 %
Lymphs Abs: 2.1 10*3/uL (ref 0.7–4.0)
MCH: 30.7 pg (ref 26.0–34.0)
MCHC: 33.1 g/dL (ref 30.0–36.0)
MCV: 93 fL (ref 80.0–100.0)
Monocytes Absolute: 0.5 10*3/uL (ref 0.1–1.0)
Monocytes Relative: 7 %
Neutro Abs: 4.4 10*3/uL (ref 1.7–7.7)
Neutrophils Relative %: 61 %
Platelets: 307 10*3/uL (ref 150–400)
RBC: 5.11 MIL/uL (ref 3.87–5.11)
RDW: 12.8 % (ref 11.5–15.5)
WBC: 7.3 10*3/uL (ref 4.0–10.5)
nRBC: 0 % (ref 0.0–0.2)

## 2023-08-15 LAB — COMPREHENSIVE METABOLIC PANEL WITH GFR
ALT: 17 U/L (ref 0–44)
AST: 22 U/L (ref 15–41)
Albumin: 4.7 g/dL (ref 3.5–5.0)
Alkaline Phosphatase: 60 U/L (ref 38–126)
Anion gap: 12 (ref 5–15)
BUN: 13 mg/dL (ref 8–23)
CO2: 24 mmol/L (ref 22–32)
Calcium: 9.5 mg/dL (ref 8.9–10.3)
Chloride: 99 mmol/L (ref 98–111)
Creatinine, Ser: 0.66 mg/dL (ref 0.44–1.00)
GFR, Estimated: >60 mL/min (ref >60)
Glucose, Bld: 86 mg/dL (ref 70–99)
Potassium: 4.1 mmol/L (ref 3.5–5.1)
Sodium: 135 mmol/L (ref 135–145)
Total Bilirubin: 0.9 mg/dL (ref 0.0–1.2)
Total Protein: 8.3 g/dL — ABNORMAL HIGH (ref 6.5–8.1)

## 2023-08-15 LAB — BASIC METABOLIC PANEL WITH GFR
Anion gap: 7 (ref 5–15)
BUN: 14 mg/dL (ref 8–23)
CO2: 27 mmol/L (ref 22–32)
Calcium: 9.6 mg/dL (ref 8.9–10.3)
Chloride: 99 mmol/L (ref 98–111)
Creatinine, Ser: 0.75 mg/dL (ref 0.44–1.00)
GFR, Estimated: >60 mL/min (ref >60)
Glucose, Bld: 116 mg/dL — ABNORMAL HIGH (ref 70–99)
Potassium: 3.7 mmol/L (ref 3.5–5.1)
Sodium: 133 mmol/L — ABNORMAL LOW (ref 135–145)

## 2023-08-15 LAB — D-DIMER, QUANTITATIVE: D-Dimer, Quant: 1.06 ug{FEU}/mL — ABNORMAL HIGH (ref 0.00–0.50)

## 2023-08-15 LAB — BRAIN NATRIURETIC PEPTIDE
B Natriuretic Peptide: 42.8 pg/mL (ref 0.0–100.0)
B Natriuretic Peptide: 41.5 pg/mL (ref 0.0–100.0)

## 2023-08-15 LAB — T4, FREE: Free T4: 0.75 ng/dL (ref 0.61–1.12)

## 2023-08-15 LAB — TROPONIN I (HIGH SENSITIVITY)
Troponin I (High Sensitivity): 6 ng/L (ref <18)
Troponin I (High Sensitivity): 7 ng/L (ref <18)

## 2023-08-15 LAB — TSH: TSH: 2.846 u[IU]/mL (ref 0.350–4.500)

## 2023-08-15 MED ORDER — DOXYCYCLINE HYCLATE 100 MG PO TABS
100.0000 mg | ORAL_TABLET | Freq: Once | ORAL | Status: AC
  Administered 2023-08-15: 100 mg via ORAL
  Filled 2023-08-15: qty 1

## 2023-08-15 MED ORDER — IOHEXOL 350 MG/ML SOLN
75.0000 mL | Freq: Once | INTRAVENOUS | Status: AC | PRN
  Administered 2023-08-15: 60 mL via INTRAVENOUS

## 2023-08-15 MED ORDER — DOXYCYCLINE HYCLATE 100 MG PO TABS
100.0000 mg | ORAL_TABLET | Freq: Two times a day (BID) | ORAL | 0 refills | Status: AC
Start: 2023-08-15 — End: 2023-08-22

## 2023-08-15 MED ORDER — DOXYCYCLINE HYCLATE 100 MG PO TABS: 100.0000 mg | ORAL_TABLET | Freq: Two times a day (BID) | ORAL | 0 refills | Status: DC

## 2023-08-15 MED ORDER — IOHEXOL 350 MG/ML SOLN
75.0000 mL | Freq: Once | INTRAVENOUS | Status: AC | PRN
  Administered 2023-08-15: 75 mL via INTRAVENOUS

## 2023-08-15 NOTE — Discharge Instructions (Addendum)
 As we discussed, signs of pneumonia.  CT scan results are listed below.  Please follow-up with your primary care doctor, finish antibiotic course  IMPRESSION: 1. No evidence for pulmonary embolism. 2. Cardiomegaly with small pericardial effusion. 3. Mild patchy ground-glass opacities throughout the mid and lower lungs bilaterally, nonspecific. Findings may be infectious/inflammatory. 4. New abnormal soft tissue in the anterior mediastinum. Findings may be related to adenopathy, lymphoma, thymic mass or other neoplasm. 5. Single right lower lobe fissural nodule measuring 5 mm has minimally increased in size compared to prior.

## 2023-08-15 NOTE — ED Notes (Signed)
 Patient transported to CT

## 2023-08-15 NOTE — ED Triage Notes (Addendum)
 Pt presents to ED with /co of SOB, pt sent from [facility] clinic. Pt states she feels more winded than normal after talking. Pt states blood drawn at [facility] clinic, pt also had an EKG done as well. NAD noted, pt speaking in full sentences. Pt had blood work done at [facility] clinic, pt has results with her in D/C papers from [facility] clinic, pt also states she had a chest XRAY, UA and EKG.

## 2023-08-15 NOTE — ED Notes (Addendum)
 CT tech brought pt back and advised her IV wasn't placed correctly and they was unable to perform the study. MD and assigned RN made aware.

## 2023-08-15 NOTE — ED Notes (Signed)
 Pt returned from CT

## 2023-08-15 NOTE — ED Notes (Signed)
 Attempted PIV start x 2 unsuccessful; will request another nurse to assist.

## 2023-08-15 NOTE — ED Provider Notes (Addendum)
 [facility] Provider Note    Event Date/Time   First MD Initiated Contact with Patient 08/15/23 1502     (approximate)   History   Shortness of Breath   HPI  Dana Snow is a 78 y.o. female who presents to the ED for evaluation of Shortness of Breath   I reviewed clinic visit from this morning the patient was seen for the same symptoms and workup was initiated.  I reviewed EKG from this visit, UA without infectious features, CBC normal.  Looks like they did an x-ray but I do not see a repeat or any image, no troponin, chemistry.  D-dimer was sent but not resulted. At baseline she has a history of hypertension and previous tobacco abuse.  Patient presents for evaluation of about 5 days of shortness of breath, chest heaviness and fatigue after presumed viral illness.  Symptoms started about 2 weeks ago with a URI, husband had the same symptoms, they think they got up from a grandchild.  After about 10 days the symptoms improved for about 2 days before these past 5 days of dyspnea on exertion, chest heaviness.  No syncope or chest pain.  Does have some atraumatic right sided posterior thoracic pain.  Feels a muscle knot.  Physical Exam   Triage Vital Signs: ED Triage Vitals [08/15/23 1454]  Encounter Vitals Group     BP (!) 183/86     Systolic BP Percentile      Diastolic BP Percentile      Pulse Rate (!) 108     Resp 18     Temp 97.8 F (36.6 C)     Temp Source Oral     SpO2 100 %     Weight 135 lb (61.2 kg)     Height 5\' 2"  (1.575 m)     Head Circumference      Peak Flow      Pain Score 0     Pain Loc      Pain Education      Exclude from Growth Chart     Most recent vital signs: Vitals:   08/15/23 1600 08/15/23 1922  BP:  (!) 175/79  Pulse: 91 96  Resp: (!) 21 20  Temp:  98.4 F (36.9 C)  SpO2: 98% 95%    General: Awake, no distress.  Quite talkative CV:  Good peripheral perfusion.  Tachycardic and regular Resp:  Normal effort.   No wheezing Abd:  No distention.  Soft MSK:  No deformity noted.  Some mild tenderness over the right scapula without overlying skin changes or signs of trauma. Neuro:  No focal deficits appreciated. Other:     ED Results / Procedures / Treatments   Labs (all labs ordered are listed, but only abnormal results are displayed) Labs Reviewed  COMPREHENSIVE METABOLIC PANEL WITH GFR - Abnormal; Notable for the following components:      Result Value   Total Protein 8.3 (*)    All other components within normal limits  CBC WITH DIFFERENTIAL/PLATELET - Abnormal; Notable for the following components:   Hemoglobin 15.7 (*)    HCT 47.5 (*)    All other components within normal limits  BRAIN NATRIURETIC PEPTIDE  TSH  T4, FREE  TROPONIN I (HIGH SENSITIVITY)  TROPONIN I (HIGH SENSITIVITY)    EKG Twelve-lead EKG from adjacent clinic Sinus tachycardia the rate of 101 bpm.  Low voltage.  Leftward axis, normal intervals and no STEMI.  RADIOLOGY CTA chest interpreted  by me without signs of PE, lower lobe pneumonia bilaterally  Official radiology report(s): CT Angio Chest PE W and/or Wo Contrast Result Date: 08/15/2023 CLINICAL DATA:  Shortness of breath and chest pain. EXAM: CT ANGIOGRAPHY CHEST WITH CONTRAST TECHNIQUE: Multidetector CT imaging of the chest was performed using the standard protocol during bolus administration of intravenous contrast. Multiplanar CT image reconstructions and MIPs were obtained to evaluate the vascular anatomy. RADIATION DOSE REDUCTION: This exam was performed according to the departmental dose-optimization program which includes automated exposure control, adjustment of the mA and/or kV according to patient size and/or use of iterative reconstruction technique. CONTRAST:  75mL OMNIPAQUE  IOHEXOL  350 MG/ML SOLN COMPARISON:  Chest CT 08/07/2019 FINDINGS: Cardiovascular: Satisfactory opacification of the pulmonary arteries to the segmental level. No evidence of pulmonary  embolism. The heart is enlarged. There is a small pericardial effusion. There are atherosclerotic calcifications of the aorta. Mediastinum/Nodes: There are enlarged right hilar lymph nodes measuring to 4 mm. Prominent subcarinal lymph node identified. There is new abnormal soft tissue in the anterior mediastinum superiorly at the origin of the great vessels on the left measuring 2.7 x 3.3 by 2.5 cm and anterior to the proximal aortic arch on the right measuring 3.2 x 4.1 x 1.8 cm. The visualized thyroid  gland and esophagus are within normal limits. Lungs/Pleura: Mild emphysema present. Mild patchy ground-glass opacities are seen throughout the mid and lower lungs bilaterally. There is a fissural nodule in the right lower lobe measuring 5 mm image 9/62 which has minimally increased in size compared to prior. There is no pleural effusion or pneumothorax. Upper Abdomen: No acute abnormality. Musculoskeletal: No acute fracture. There is some extravasation of contrast in the right arm. Review of the MIP images confirms the above findings. IMPRESSION: 1. No evidence for pulmonary embolism. 2. Cardiomegaly with small pericardial effusion. 3. Mild patchy ground-glass opacities throughout the mid and lower lungs bilaterally, nonspecific. Findings may be infectious/inflammatory. 4. New abnormal soft tissue in the anterior mediastinum. Findings may be related to adenopathy, lymphoma, thymic mass or other neoplasm. 5. Single right lower lobe fissural nodule measuring 5 mm has minimally increased in size compared to prior. Aortic Atherosclerosis (ICD10-I70.0) and Emphysema (ICD10-J43.9). Electronically Signed   By: Tyron Gallon M.D.   On: 08/15/2023 19:37    PROCEDURES and INTERVENTIONS:  .1-3 Lead EKG Interpretation  Performed by: Arline Bennett, MD Authorized by: Arline Bennett, MD     Interpretation: abnormal     ECG rate:  102   ECG rate assessment: tachycardic     Rhythm: sinus tachycardia     Ectopy: none      Conduction: normal   .Ultrasound ED Peripheral IV (Provider)  Date/Time: 08/15/2023 8:02 PM  Performed by: Arline Bennett, MD Authorized by: Arline Bennett, MD   Procedure details:    Indications: multiple failed IV attempts and poor IV access     Skin Prep: chlorhexidine gluconate     Location: right basilic v.   Angiocath:  20 G   Bedside Ultrasound Guided: Yes     Images: not archived     Patient tolerated procedure without complications: Yes     Dressing applied: Yes     Medications  iohexol  (OMNIPAQUE ) 350 MG/ML injection 75 mL (60 mLs Intravenous Contrast Given 08/15/23 1809)  iohexol  (OMNIPAQUE ) 350 MG/ML injection 75 mL (75 mLs Intravenous Contrast Given 08/15/23 1905)  doxycycline (VIBRA-TABS) tablet 100 mg (100 mg Oral Given 08/15/23 1957)     IMPRESSION / MDM / ASSESSMENT  AND PLAN / ED COURSE  I reviewed the triage vital signs and the nursing notes.  Differential diagnosis includes, but is not limited to, ACS, PTX, PNA, muscle strain/spasm, PE, dissection, anxiety, pleural effusion  {Patient presents with symptoms of an acute illness or injury that is potentially life-threatening.  78 year old woman presents to the ED from home with dyspnea, chest pressure after recent viral illness with signs of community-acquired pneumonia suitable for outpatient management with antibiotics.  Look systemically well to me, good oxygen saturations.  Presenting tachycardia is noted.  Normal CBC, troponin, thyroid  screening, BNP and metabolic panel.  CTA without PE, and as above.  I considered admission for this patient but we will attempt a course of outpatient antibiotics.  Clinical Course as of 08/15/23 2002  Sun Aug 15, 2023  1635 USIV [DS]  4098 Another USIV because the first one blew before CT [DS]  1951 Reassessed and discussed CT results, pneumonia, mediastinal mass.  She is eager to go home.  We discussed antibiotic course, PCP follow-up and possible etiologies of these mediastinal  features. [DS]    Clinical Course User Index [DS] Arline Bennett, MD     FINAL CLINICAL IMPRESSION(S) / ED DIAGNOSES   Final diagnoses:  Community acquired pneumonia, unspecified laterality     Rx / DC Orders   ED Discharge Orders          Ordered    doxycycline (VIBRA-TABS) 100 MG tablet  2 times daily,   Status:  Discontinued        08/15/23 1946    doxycycline (VIBRA-TABS) 100 MG tablet  2 times daily        08/15/23 1950             Note:  This document was prepared using Dragon voice recognition software and may include unintentional dictation errors.   Arline Bennett, MD 08/15/23 Lynnell Sato    Arline Bennett, MD 08/15/23 2002

## 2023-08-15 NOTE — ED Notes (Signed)
 Called CCMD at [phone] to initiate cardiac monitoring.

## 2023-10-11 ENCOUNTER — Other Ambulatory Visit: Payer: Self-pay | Admitting: Emergency Medicine

## 2023-10-11 DIAGNOSIS — R911 Solitary pulmonary nodule: Secondary | ICD-10-CM

## 2023-10-11 DIAGNOSIS — R9389 Abnormal findings on diagnostic imaging of other specified body structures: Secondary | ICD-10-CM

## 2023-10-11 DIAGNOSIS — J9859 Other diseases of mediastinum, not elsewhere classified: Secondary | ICD-10-CM

## 2023-10-15 ENCOUNTER — Ambulatory Visit
Admission: RE | Admit: 2023-10-15 | Discharge: 2023-10-15 | Disposition: A | Discharge status: Home / Self Care | Source: Ambulatory Visit | Attending: Emergency Medicine | Admitting: Emergency Medicine

## 2023-10-15 DIAGNOSIS — I517 Cardiomegaly: Secondary | ICD-10-CM | POA: Insufficient documentation

## 2023-10-15 DIAGNOSIS — R9389 Abnormal findings on diagnostic imaging of other specified body structures: Secondary | ICD-10-CM | POA: Diagnosis not present

## 2023-10-15 DIAGNOSIS — J9859 Other diseases of mediastinum, not elsewhere classified: Secondary | ICD-10-CM | POA: Insufficient documentation

## 2023-10-15 DIAGNOSIS — K5730 Diverticulosis of large intestine without perforation or abscess without bleeding: Secondary | ICD-10-CM | POA: Diagnosis not present

## 2023-10-15 DIAGNOSIS — M899 Disorder of bone, unspecified: Secondary | ICD-10-CM | POA: Diagnosis not present

## 2023-10-15 DIAGNOSIS — R937 Abnormal findings on diagnostic imaging of other parts of musculoskeletal system: Secondary | ICD-10-CM | POA: Diagnosis not present

## 2023-10-15 DIAGNOSIS — I3139 Other pericardial effusion (noninflammatory): Secondary | ICD-10-CM | POA: Diagnosis not present

## 2023-10-15 DIAGNOSIS — R911 Solitary pulmonary nodule: Secondary | ICD-10-CM | POA: Insufficient documentation

## 2023-10-15 DIAGNOSIS — R590 Localized enlarged lymph nodes: Secondary | ICD-10-CM | POA: Insufficient documentation

## 2023-10-15 LAB — GLUCOSE, CAPILLARY: Glucose-Capillary: 109 mg/dL — ABNORMAL HIGH (ref 70–99)

## 2023-10-15 MED ORDER — FLUDEOXYGLUCOSE F - 18 (FDG) INJECTION
7.7400 | Freq: Once | INTRAVENOUS | Status: AC | PRN
  Administered 2023-10-15: 7.74 via INTRAVENOUS

## 2023-10-25 NOTE — Progress Notes (Signed)
 ENCOUNTER: Patient Class :No patient class for patient encounter Department: [facility] [address] [place] [address]  PATIENT: Patient Demographics      Name Patient ID SSN Gender Identity Birth Date   Ameera, Tigue [id number] Female [birth year] (78 yrs)          Address Phone Email       [address] [place] [address] [phone] (H) [email or web address] Caucasian/White             Reg Status PCP Date Last Verified Next Review Date     Verified Marikay Eva Hausen [id number] 10/25/23 11/24/23           Marital Status Religion Language       Married Unknown-Patient Declined English              EMERGENCY CONTACT: Name Relationship Lgl Grd Work Administrator, sports  1. [facility] Spouse   [phone]    GUARANTOR: There is no guarantor information entered for this encounter.  COVERAGE: Primary Visit Coverage      Payer Plan Group Number Group Name Payer Phone Plan Phone   No coverage found                Secondary Visit Coverage      Payer Plan Group Number Group Name Payer Phone Plan Phone   No coverage found                Primary Coverage      Payer Plan Group Number Group Name Payer Phone Plan Phone   [facility] MEDICARE [place] [employer] [facility] CHOICE [id number] [place]  [facility]  [phone]           Primary Subscriber      Subscriber ID Subscriber Name Subscriber [facility] Subscriber Address   [id number] [facility] [id number] [address]      [place], [place] [address]           Secondary Coverage      Payer Plan Group Number Group Name Payer Phone Plan Phone   No coverage found

## 2023-10-29 ENCOUNTER — Telehealth: Payer: Self-pay | Admitting: Internal Medicine

## 2023-10-29 NOTE — Telephone Encounter (Signed)
 Pt had an appt scheduled for 8/11 for MD/lab - new pt. She said that she saw the pulmonologist yesterday and he said he didn't want a biopsy. She said she wanted to cancel these appts for MD/lab. I asked if she wanted to reschedule and she said no. Notified clinical team and Jenn. - LH

## 2023-11-05 ENCOUNTER — Other Ambulatory Visit: Payer: Self-pay | Admitting: Internal Medicine

## 2023-11-05 DIAGNOSIS — I429 Cardiomyopathy, unspecified: Secondary | ICD-10-CM

## 2023-11-08 ENCOUNTER — Other Ambulatory Visit

## 2023-11-08 ENCOUNTER — Ambulatory Visit: Admitting: Internal Medicine

## 2023-11-09 ENCOUNTER — Encounter (HOSPITAL_COMMUNITY): Payer: Self-pay

## 2023-11-10 ENCOUNTER — Ambulatory Visit
Admission: RE | Admit: 2023-11-10 | Discharge: 2023-11-10 | Disposition: A | Discharge status: Home / Self Care | Source: Ambulatory Visit | Attending: Internal Medicine | Admitting: Internal Medicine

## 2023-11-10 ENCOUNTER — Other Ambulatory Visit: Payer: Self-pay | Admitting: Internal Medicine

## 2023-11-10 DIAGNOSIS — I429 Cardiomyopathy, unspecified: Secondary | ICD-10-CM | POA: Diagnosis present

## 2023-11-10 MED ORDER — GADOBUTROL 1 MMOL/ML IV SOLN
9.0000 mL | Freq: Once | INTRAVENOUS | Status: AC | PRN
  Administered 2023-11-10 (×2): 9 mL via INTRAVENOUS

## 2023-11-15 ENCOUNTER — Other Ambulatory Visit: Payer: Self-pay | Admitting: *Deleted

## 2023-11-15 ENCOUNTER — Encounter: Payer: Self-pay | Admitting: Internal Medicine

## 2023-11-15 ENCOUNTER — Inpatient Hospital Stay: Attending: Internal Medicine | Admitting: Internal Medicine

## 2023-11-15 ENCOUNTER — Inpatient Hospital Stay

## 2023-11-15 ENCOUNTER — Telehealth: Payer: Self-pay | Admitting: *Deleted

## 2023-11-15 ENCOUNTER — Telehealth: Payer: Self-pay | Admitting: Internal Medicine

## 2023-11-15 ENCOUNTER — Other Ambulatory Visit: Payer: Self-pay | Admitting: Internal Medicine

## 2023-11-15 VITALS — BP 145/89 | HR 113 | Temp 97.3°F | Resp 18 | Ht 62.0 in | Wt 133.7 lb

## 2023-11-15 DIAGNOSIS — H5702 Anisocoria: Secondary | ICD-10-CM | POA: Insufficient documentation

## 2023-11-15 DIAGNOSIS — J9859 Other diseases of mediastinum, not elsewhere classified: Secondary | ICD-10-CM | POA: Insufficient documentation

## 2023-11-15 DIAGNOSIS — Z17411 Hormone receptor positive with human epidermal growth factor receptor 2 negative status: Secondary | ICD-10-CM | POA: Insufficient documentation

## 2023-11-15 DIAGNOSIS — M7989 Other specified soft tissue disorders: Secondary | ICD-10-CM | POA: Insufficient documentation

## 2023-11-15 DIAGNOSIS — R5383 Other fatigue: Secondary | ICD-10-CM | POA: Insufficient documentation

## 2023-11-15 DIAGNOSIS — Z8616 Personal history of COVID-19: Secondary | ICD-10-CM | POA: Insufficient documentation

## 2023-11-15 DIAGNOSIS — R221 Localized swelling, mass and lump, neck: Secondary | ICD-10-CM

## 2023-11-15 DIAGNOSIS — R634 Abnormal weight loss: Secondary | ICD-10-CM | POA: Diagnosis not present

## 2023-11-15 DIAGNOSIS — Z170 Estrogen receptor positive status [ER+]: Secondary | ICD-10-CM | POA: Insufficient documentation

## 2023-11-15 DIAGNOSIS — M8580 Other specified disorders of bone density and structure, unspecified site: Secondary | ICD-10-CM | POA: Insufficient documentation

## 2023-11-15 DIAGNOSIS — C50312 Malignant neoplasm of lower-inner quadrant of left female breast: Secondary | ICD-10-CM | POA: Insufficient documentation

## 2023-11-15 DIAGNOSIS — R590 Localized enlarged lymph nodes: Secondary | ICD-10-CM | POA: Diagnosis not present

## 2023-11-15 DIAGNOSIS — Z90710 Acquired absence of both cervix and uterus: Secondary | ICD-10-CM | POA: Insufficient documentation

## 2023-11-15 DIAGNOSIS — I10 Essential (primary) hypertension: Secondary | ICD-10-CM | POA: Diagnosis not present

## 2023-11-15 DIAGNOSIS — R531 Weakness: Secondary | ICD-10-CM | POA: Insufficient documentation

## 2023-11-15 DIAGNOSIS — J3800 Paralysis of vocal cords and larynx, unspecified: Secondary | ICD-10-CM | POA: Insufficient documentation

## 2023-11-15 DIAGNOSIS — Z87891 Personal history of nicotine dependence: Secondary | ICD-10-CM | POA: Insufficient documentation

## 2023-11-15 DIAGNOSIS — Z880 Allergy status to penicillin: Secondary | ICD-10-CM | POA: Insufficient documentation

## 2023-11-15 DIAGNOSIS — R059 Cough, unspecified: Secondary | ICD-10-CM | POA: Diagnosis not present

## 2023-11-15 DIAGNOSIS — Z510 Encounter for antineoplastic radiation therapy: Secondary | ICD-10-CM | POA: Insufficient documentation

## 2023-11-15 DIAGNOSIS — Z923 Personal history of irradiation: Secondary | ICD-10-CM | POA: Diagnosis not present

## 2023-11-15 DIAGNOSIS — R490 Dysphonia: Secondary | ICD-10-CM | POA: Insufficient documentation

## 2023-11-15 DIAGNOSIS — R0602 Shortness of breath: Secondary | ICD-10-CM | POA: Insufficient documentation

## 2023-11-15 DIAGNOSIS — Z9012 Acquired absence of left breast and nipple: Secondary | ICD-10-CM | POA: Insufficient documentation

## 2023-11-15 DIAGNOSIS — C781 Secondary malignant neoplasm of mediastinum: Secondary | ICD-10-CM | POA: Insufficient documentation

## 2023-11-15 DIAGNOSIS — I3139 Other pericardial effusion (noninflammatory): Secondary | ICD-10-CM | POA: Insufficient documentation

## 2023-11-15 DIAGNOSIS — I871 Compression of vein: Secondary | ICD-10-CM | POA: Insufficient documentation

## 2023-11-15 DIAGNOSIS — Z79899 Other long term (current) drug therapy: Secondary | ICD-10-CM | POA: Insufficient documentation

## 2023-11-15 DIAGNOSIS — Z885 Allergy status to narcotic agent status: Secondary | ICD-10-CM | POA: Insufficient documentation

## 2023-11-15 LAB — COMPREHENSIVE METABOLIC PANEL WITH GFR
ALT: 21 U/L (ref 0–44)
AST: 21 U/L (ref 15–41)
Albumin: 4.3 g/dL (ref 3.5–5.0)
Alkaline Phosphatase: 56 U/L (ref 38–126)
Anion gap: 11 (ref 5–15)
BUN: 15 mg/dL (ref 8–23)
CO2: 26 mmol/L (ref 22–32)
Calcium: 10 mg/dL (ref 8.9–10.3)
Chloride: 93 mmol/L — ABNORMAL LOW (ref 98–111)
Creatinine, Ser: 0.82 mg/dL (ref 0.44–1.00)
GFR, Estimated: >60 mL/min (ref >60)
Glucose, Bld: 101 mg/dL — ABNORMAL HIGH (ref 70–99)
Potassium: 4.6 mmol/L (ref 3.5–5.1)
Sodium: 130 mmol/L — ABNORMAL LOW (ref 135–145)
Total Bilirubin: 1 mg/dL (ref 0.0–1.2)
Total Protein: 7.5 g/dL (ref 6.5–8.1)

## 2023-11-15 LAB — CBC WITH DIFFERENTIAL/PLATELET
Abs Immature Granulocytes: 0.02 K/uL (ref 0.00–0.07)
Basophils Absolute: 0.1 K/uL (ref 0.0–0.1)
Basophils Relative: 1 %
Eosinophils Absolute: 0 K/uL (ref 0.0–0.5)
Eosinophils Relative: 0 %
HCT: 44.6 % (ref 36.0–46.0)
Hemoglobin: 14.7 g/dL (ref 12.0–15.0)
Immature Granulocytes: 0 %
Lymphocytes Relative: 19 %
Lymphs Abs: 1.6 K/uL (ref 0.7–4.0)
MCH: 31.1 pg (ref 26.0–34.0)
MCHC: 33 g/dL (ref 30.0–36.0)
MCV: 94.5 fL (ref 80.0–100.0)
Monocytes Absolute: 0.9 K/uL (ref 0.1–1.0)
Monocytes Relative: 11 %
Neutro Abs: 5.6 K/uL (ref 1.7–7.7)
Neutrophils Relative %: 69 %
Platelets: 277 K/uL (ref 150–400)
RBC: 4.72 MIL/uL (ref 3.87–5.11)
RDW: 13.2 % (ref 11.5–15.5)
WBC: 8.1 K/uL (ref 4.0–10.5)
nRBC: 0 % (ref 0.0–0.2)

## 2023-11-15 LAB — LACTATE DEHYDROGENASE: LDH: 138 U/L (ref 98–192)

## 2023-11-15 NOTE — Telephone Encounter (Signed)
 A biopsy appt has been offered to patient. Thurs 11/18/23 Arrival time of 1pm [facility]. Pt can eat and drink. Appt approved by pt's husband.She will be there.

## 2023-11-15 NOTE — Progress Notes (Signed)
 SOB gotten worse x2 months,seeing Dr. Beuford No appetite, only drinking a small amount of boost, she states it makes her constipation, taking MOM. No energy.  Has had heart echo, PET and MRI.  HX lt breast cancer 2015.

## 2023-11-15 NOTE — Progress Notes (Signed)
 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 Smith]]  ER/PR- >90%; Her 2 Nue- NEG; Lumpec & SLNBx Oncotype-RS=19 No Chemo; s/p RT [Dr.Crystal] April 2016- Arimidex; Stopped Oct 2016; START OCT 2016- Femara ; Sep 19th stopped sec to [employer]; sep 19th 2019- START AROMASIN ; stop summer 2021.  # Hot flashes- resolved  # BMD- Osteopenia [may 2016]; AUG 2018- T score= -1.1 [improved] ------------------------------------------------------------   DIAGNOSIS: BREAST CANCER   STAGE:    II     ;GOALS: Cure  CURRENT/MOST RECENT THERAPY: Surveillance.    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 Accompanied by husband,  Dana Snow 78 y.o.  female pleasant patient with a prior history of LEFT breast cancer-stage II lobular-currently off AI [stopped in summer 2021]-has been referred to us  for further evaluation recommendations for mediastinal mass.  Patient noted to have worsening shortness of breath over the last few months.  Patient was seen in the emergency room CTA showed-no PE but noted to have mediastinal lymphadenopathy.  Also noted to have hoarseness of voice.  Complains of worsening dry cough.  Shortness of breath with mild exertion.  Mild swelling of the legs.  Patient subsequently evaluated by [facility] cardiology and also ENT.  Patient also had a recent 2D echo and  cardiac MRI for further evaluation.   Review of Systems  Constitutional:  Positive for malaise/fatigue and weight loss. Negative for chills, diaphoresis and fever.  HENT:  Negative for nosebleeds and sore throat.   Eyes:  Negative for double vision.  Respiratory:  Positive for cough and shortness of breath. Negative for hemoptysis, sputum production and wheezing.   Cardiovascular:  Negative for chest pain, palpitations, orthopnea and leg swelling.  Gastrointestinal:  Negative for abdominal pain, blood in stool, constipation, diarrhea, heartburn, melena, nausea and vomiting.  Genitourinary:  Negative for dysuria, frequency and urgency.  Musculoskeletal:  Negative for back pain and joint pain.  Skin: Negative.  Negative for itching and rash.  Neurological:  Positive for weakness. Negative for dizziness, tingling, focal weakness and headaches.  Endo/Heme/Allergies:  Does not bruise/bleed easily.  Psychiatric/Behavioral:  Negative for depression. The patient is not nervous/anxious and does not have insomnia.     MEDICAL HISTORY:  Past Medical History:  Diagnosis Date   Breast cancer (HCC) 2015   left lumpectomy 04/05/2014 and reexcision 04/27/2014 - radiation treatment   History of COVID-19    Hypertension    Personal history of radiation therapy 2016   Left breast    SURGICAL HISTORY: Past Surgical History:  Procedure Laterality Date   ABDOMINAL HYSTERECTOMY     BREAST BIOPSY Left 03/15/2014   8:00 us  bx, SCLEROSING ADENOSIS AND COLUMNAR CELL CHANGE    BREAST BIOPSY Left 02/27/2014   invasive lobular carcinoma LIQ   BREAST LUMPECTOMY Left 04/03/2014   [facility], DCIS, ALH, negative LN   BREAST LUMPECTOMY Left 04/27/2014   re-excision for clear margins   COMBINED HYSTEROSCOPY DIAGNOSTIC / D&C  PARTIAL MASTECTOMY WITH AXILLARY SENTINEL LYMPH NODE BIOPSY Left     SOCIAL HISTORY: Social History   Socioeconomic History   Marital status: Married    Spouse name: Not on file    Number of children: Not on file   Years of education: Not on file   Highest education level: Not on file  Occupational History   Not on file  Tobacco Use   Smoking status: Former    Current packs/day: 1.50    Types: Cigarettes   Smokeless tobacco: Never  Vaping Use   Vaping status: Never Used  Substance and Sexual Activity   Alcohol use: Yes    Alcohol/week: 2.0 standard drinks of alcohol    Types: 2 Glasses of wine per week   Drug use: Never   Sexual activity: Never  Other Topics Concern   Not on file  Social History Narrative   Not on file   Social Drivers of Health   Financial Resource Strain: Low Risk  (03/11/2023)   Received from [facility]   Overall Financial Resource Strain (CARDIA)    Difficulty of Paying Living Expenses: Not hard at all  Food Insecurity: No Food Insecurity (11/15/2023)   Hunger Vital Sign    Worried About Running Out of Food in the Last Year: Never true    Ran Out of Food in the Last Year: Never true  Transportation Needs: No Transportation Needs (11/15/2023)   PRAPARE - Administrator, Civil Service (Medical): No    Lack of Transportation (Non-Medical): No  Physical Activity: Not on file  Stress: Not on file  Social Connections: Not on file  Intimate Partner Violence: Not At Risk (11/15/2023)   Humiliation, Afraid, Rape, and Kick questionnaire    Fear of Current or Ex-Partner: No    Emotionally Abused: No    Physically Abused: No    Sexually Abused: No    FAMILY HISTORY: Family History  Problem Relation Age of Onset   Breast cancer Neg Hx     ALLERGIES:  is allergic to acetaminophen, codeine, codone [hydrocodone], oxycodone hcl, percocet [oxycodone-acetaminophen], and penicillins.  MEDICATIONS:  Current Outpatient Medications  Medication Sig Dispense Refill   Calcium-Vitamin D  600-200 MG-UNIT per tablet Take 1 tablet by mouth daily.     furosemide (LASIX) 20 MG tablet Take 20 mg by mouth daily.      potassium chloride (KLOR-CON) 10 MEQ tablet Take 10 mEq by mouth once.     vitamin C (ASCORBIC ACID) 500 MG tablet Take 500 mg by mouth daily.     No current facility-administered medications for this visit.    PHYSICAL EXAMINATION:   Vitals:   11/15/23 1048  BP: (!) 145/89  Pulse: (!) 113  Temp: (!) 97.3 F (36.3 C)  SpO2: 97%   Filed Weights   11/15/23 1048  Weight: 133 lb 11.2 oz (60.6 kg)   Facial puffiness noted.  Right pupil smaller- and drooping noted.   Venous engorgement on the chest noted.   Physical Exam Vitals and nursing Snow reviewed.  HENT:     Head: Normocephalic and atraumatic.     Mouth/Throat:     Pharynx: Oropharynx is clear.  Eyes:     Extraocular Movements: Extraocular movements intact.     Pupils: Pupils are equal, round, and reactive to light.  Cardiovascular:     Rate and Rhythm: Normal rate and regular rhythm.  Pulmonary:     Comments: Decreased breath sounds bilaterally.  Abdominal:  Palpations: Abdomen is soft.  Musculoskeletal:        General: Normal range of motion.     Cervical back: Normal range of motion.  Skin:    General: Skin is warm.  Neurological:     General: No focal deficit present.     Mental Status: She is alert and oriented to person, place, and time.  Psychiatric:        Behavior: Behavior normal.        Judgment: Judgment normal.     LABORATORY DATA:  I have reviewed the data as listed Lab Results  Component Value Date   WBC 8.1 11/15/2023   HGB 14.7 11/15/2023   HCT 44.6 11/15/2023   MCV 94.5 11/15/2023   PLT 277 11/15/2023   Recent Labs    08/15/23 1343 08/15/23 1635 11/15/23 1210  NA 133* 135 130*  K 3.7 4.1 4.6  CL 99 99 93*  CO2 27 24 26   GLUCOSE 116* 86 101*  BUN 14 13 15   CREATININE 0.75 0.66 0.82  CALCIUM 9.6 9.5 10.0  GFRNONAA >60 >60 >60  PROT  --  8.3* 7.5  ALBUMIN  --  4.7 4.3  AST  --  22 21  ALT  --  17 21  ALKPHOS  --  60 56  BILITOT  --  0.9 1.0    RADIOGRAPHIC  STUDIES: I have personally reviewed the radiological images as listed and agreed with the findings in the report. MR CARDIAC MORPHOLOGY W WO CONTRAST Result Date: 11/10/2023 CLINICAL DATA:  Shortness of breath, tachycardia EXAM: CARDIAC MRI TECHNIQUE: The patient was scanned on a 1.5 Tesla Siemens magnet. A dedicated cardiac coil was used. Functional imaging was done using Fiesta sequences. 2,3, and 4 chamber views were done to assess for RWMA's. Modified Simpson's rule using a short axis stack was used to calculate an ejection fraction on a dedicated work [employer]. The patient received 9 cc of Gadavist . After 10 minutes inversion recovery sequences were used to assess for infiltration and scar tissue. Velocity flow mapping performed in the ascending aorta and main pulmonary artery. CONTRAST:  9 cc  of Gadavist  FINDINGS: 1. Normal left ventricular size, thickness and systolic function (LVEF = 59%). There are no regional wall motion abnormalities. There is no late gadolinium enhancement in the left ventricular myocardium. SV: 39 ml CO: 4.1 L/min Myocardial mass: 60 g LV native T1 value 996 ms (normal <1000 ms) LV ECV value 22% (normal <30%) 2. Normal right ventricular size, thickness and systolic function (RVEF = 69%). There are no regional wall motion abnormalities. 3.  Normal left and right atrial size. 4. Normal size of the aortic root, ascending aorta and pulmonary artery. 5. Mild aortic valve regurgitation. no significant valvular abnormalities. 6.  Normal pericardium. Small pericardial effusion. IMPRESSION: 1.  Normal LV systolic function.  LVEF 59%. 2.  No LGE or scar. 3.  Normal RV systolic function. 4.  Mild aortic valve regurgitation. 5.  No evidence for infiltrative disease. Electronically Signed   By: Redell Cave M.D.   On: 11/10/2023 15:01   MR CARDIAC VELOCITY FLOW MAP Result Date: 11/10/2023 CLINICAL DATA:  Shortness of breath, tachycardia EXAM: CARDIAC MRI TECHNIQUE:  The patient was scanned on a 1.5 Tesla Siemens magnet. A dedicated cardiac coil was used. Functional imaging was done using Fiesta sequences. 2,3, and 4 chamber views were done to assess for RWMA's. Modified Simpson's rule using a short axis stack was used to calculate  an ejection fraction on a dedicated work [employer]. The patient received 9 cc of Gadavist . After 10 minutes inversion recovery sequences were used to assess for infiltration and scar tissue. Velocity flow mapping performed in the ascending aorta and main pulmonary artery. CONTRAST:  9 cc  of Gadavist  FINDINGS: 1. Normal left ventricular size, thickness and systolic function (LVEF = 59%). There are no regional wall motion abnormalities. There is no late gadolinium enhancement in the left ventricular myocardium. SV: 39 ml CO: 4.1 L/min Myocardial mass: 60 g LV native T1 value 996 ms (normal <1000 ms) LV ECV value 22% (normal <30%) 2. Normal right ventricular size, thickness and systolic function (RVEF = 69%). There are no regional wall motion abnormalities. 3.  Normal left and right atrial size. 4. Normal size of the aortic root, ascending aorta and pulmonary artery. 5. Mild aortic valve regurgitation. no significant valvular abnormalities. 6.  Normal pericardium. Small pericardial effusion. IMPRESSION: 1.  Normal LV systolic function.  LVEF 59%. 2.  No LGE or scar. 3.  Normal RV systolic function. 4.  Mild aortic valve regurgitation. 5.  No evidence for infiltrative disease. Electronically Signed   By: Redell Cave M.D.   On: 11/10/2023 15:01   MR CARDIAC VELOCITY FLOW MAP Result Date: 11/10/2023 CLINICAL DATA:  Shortness of breath, tachycardia EXAM: CARDIAC MRI TECHNIQUE: The patient was scanned on a 1.5 Tesla Siemens magnet. A dedicated cardiac coil was used. Functional imaging was done using Fiesta sequences. 2,3, and 4 chamber views were done to assess for RWMA's. Modified Simpson's rule using a short axis stack was  used to calculate an ejection fraction on a dedicated work [employer], [employer]. The patient received 9 cc of Gadavist . After 10 minutes inversion recovery sequences were used to assess for infiltration and scar tissue. Velocity flow mapping performed in the ascending aorta and main pulmonary artery. CONTRAST:  9 cc  of Gadavist  FINDINGS: 1. Normal left ventricular size, thickness and systolic function (LVEF = 59%). There are no regional wall motion abnormalities. There is no late gadolinium enhancement in the left ventricular myocardium. SV: 39 ml CO: 4.1 L/min Myocardial mass: 60 g LV native T1 value 996 ms (normal <1000 ms) LV ECV value 22% (normal <30%) 2. Normal right ventricular size, thickness and systolic function (RVEF = 69%). There are no regional wall motion abnormalities. 3.  Normal left and right atrial size. 4. Normal size of the aortic root, ascending aorta and pulmonary artery. 5. Mild aortic valve regurgitation. no significant valvular abnormalities. 6.  Normal pericardium. Small pericardial effusion. IMPRESSION: 1.  Normal LV systolic function.  LVEF 59%. 2.  No LGE or scar. 3.  Normal RV systolic function. 4.  Mild aortic valve regurgitation. 5.  No evidence for infiltrative disease. Electronically Signed   By: Redell Cave M.D.   On: 11/10/2023 15:01     Mediastinal mass # MAY 2025- CTA chest-  There are enlarged right hilar lymph nodes measuring to 4 mm. Prominent subcarinal lymph node identified. There is new abnormal soft tissue in the anterior mediastinum superiorly at the origin of the great vessels on the left measuring 2.7 x 3.3 by 2.5 cm and anterior to the proximal aortic arch on the right measuring 3.2 x 4.1 x 1.8 cm.  PET scan- JULY 2025- Confluent abnormal soft tissue along the upper mediastinum. More towards the right side with involvement of the right lung hilum and separate tissue or nodes thoracic inlet on the right  side adjacent to the right clavicle  corresponding to the finding by prior CT scan. here are 4 hypermetabolic small bony lesions identified including thoracic spine at T1 and T2, the right acetabulum and left scapula worrisome for bony metastatic disease. A thoracic spine MRI may be of some benefit to further characterize the spine lesion. Two small nonspecific areas of asymmetric uptake along skeletal muscle of the left gluteus medius muscle and the left pectoralis muscle.   # Given prior history of Left breast stage II mixed Ductal +lobular cancer- [status post aromasin -2021]-thickening concerning for recurrent malignancy.  Recommend checking cancer markers.  I have reached out to radiology regarding biopsy options.  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  ASAP.  # OSTEOPENIA- [BMD= T score- 1.4]; continue ca+ vit D; exercise.  Recommend bone density test in 2 years.  Defer to PCP  # IV accesss: difficult.   # DISPOSITION:   # labs- cbc/cmp; tumor markers- ordered.  # CT neck ASAP # follow up TBD- Dr.B  # I reviewed the blood work- with the patient in detail; also reviewed the imaging independently [as summarized above]; and with the patient in detail.   Addendum: Discussed with radiology- Dr.McClough.  Plan for ultrasound-guided biopsy of the right neck mass.  Discussed with the patient.  She is agreement.  Please schedule appt-in 1 week or so after the neck Biopsy-MD: no labs.   Cc;     Above plan of care was discussed with patient/family in detail.  My contact information was given to the patient/family.       Dana JONELLE Joe, MD 11/15/2023 1:44 PM

## 2023-11-15 NOTE — Telephone Encounter (Signed)
 Please schedule appt-in 1 week or so after the neck Biopsy-MD: no labs.

## 2023-11-15 NOTE — Assessment & Plan Note (Addendum)
#   MAY 2025- CTA chest-  There are enlarged right hilar lymph nodes measuring to 4 mm. Prominent subcarinal lymph node identified. There is new abnormal soft tissue in the anterior mediastinum superiorly at the origin of the great vessels on the left measuring 2.7 x 3.3 by 2.5 cm and anterior to the proximal aortic arch on the right measuring 3.2 x 4.1 x 1.8 cm.  PET scan- JULY 2025- Confluent abnormal soft tissue along the upper mediastinum. More towards the right side with involvement of the right lung hilum and separate tissue or nodes thoracic inlet on the right side adjacent to the right clavicle corresponding to the finding by prior CT scan. here are 4 hypermetabolic small bony lesions identified including thoracic spine at T1 and T2, the right acetabulum and left scapula worrisome for bony metastatic disease. A thoracic spine MRI may be of some benefit to further characterize the spine lesion. Two small nonspecific areas of asymmetric uptake along skeletal muscle of the left gluteus medius muscle and the left pectoralis muscle.   # Given prior history of Left breast stage II mixed Ductal +lobular cancer- [status post aromasin -2021]-thickening concerning for recurrent malignancy.  Recommend checking cancer markers.  I have reached out to radiology regarding biopsy options.  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  ASAP.  # OSTEOPENIA- [BMD= T score- 1.4]; continue ca+ vit D; exercise.  Recommend bone density test in 2 years.  Defer to PCP  # IV accesss: difficult.   # DISPOSITION:   # labs- cbc/cmp; tumor markers- ordered.  # CT neck ASAP # follow up TBD- Dr.B  # I reviewed the blood work- with the patient in detail; also reviewed the imaging independently [as summarized above]; and with the patient in detail.   Addendum: Discussed with radiology- Dr.McClough.  Plan for  ultrasound-guided biopsy of the right neck mass.  Discussed with the patient.  She is agreement.  Please schedule appt-in 1 week or so after the neck Biopsy-MD: no labs.   Cc;

## 2023-11-16 ENCOUNTER — Telehealth: Payer: Self-pay | Admitting: Internal Medicine

## 2023-11-16 ENCOUNTER — Ambulatory Visit
Admission: RE | Admit: 2023-11-16 | Discharge: 2023-11-16 | Disposition: A | Discharge status: Home / Self Care | Source: Ambulatory Visit | Attending: Internal Medicine | Admitting: Internal Medicine

## 2023-11-16 ENCOUNTER — Other Ambulatory Visit: Payer: Self-pay | Admitting: *Deleted

## 2023-11-16 ENCOUNTER — Other Ambulatory Visit: Payer: Self-pay | Admitting: Internal Medicine

## 2023-11-16 DIAGNOSIS — I871 Compression of vein: Secondary | ICD-10-CM

## 2023-11-16 DIAGNOSIS — R221 Localized swelling, mass and lump, neck: Secondary | ICD-10-CM

## 2023-11-16 DIAGNOSIS — J9859 Other diseases of mediastinum, not elsewhere classified: Secondary | ICD-10-CM

## 2023-11-16 LAB — CANCER ANTIGEN 27.29: CA 27.29: 102.8 U/mL — ABNORMAL HIGH (ref 0.0–38.6)

## 2023-11-16 LAB — CEA: CEA: 2.8 ng/mL (ref 0.0–4.7)

## 2023-11-16 LAB — CANCER ANTIGEN 15-3: CA 15-3: 67 U/mL — ABNORMAL HIGH (ref 0.0–25.0)

## 2023-11-16 MED ORDER — IOPAMIDOL (ISOVUE-300) INJECTION 61%: 75.0000 mL | Freq: Once | INTRAVENOUS | Status: DC | PRN

## 2023-11-16 NOTE — Telephone Encounter (Signed)
 Janella from [facility] calling and states they were not able get the pt iv accessed and she would need to go the the hospital to get a US  guided IV access.

## 2023-11-16 NOTE — Progress Notes (Signed)
 Discussed with vascular-recommend CT chest/venogram.  Canceled CT chest message sent to staff.  Left voicemail for preauthorization-   GB

## 2023-11-17 ENCOUNTER — Ambulatory Visit

## 2023-11-17 ENCOUNTER — Ambulatory Visit
Admission: RE | Admit: 2023-11-17 | Discharge: 2023-11-17 | Disposition: A | Discharge status: Home / Self Care | Source: Ambulatory Visit | Attending: Internal Medicine | Admitting: Internal Medicine

## 2023-11-17 ENCOUNTER — Other Ambulatory Visit: Payer: Self-pay

## 2023-11-17 DIAGNOSIS — I700 Atherosclerosis of aorta: Secondary | ICD-10-CM | POA: Diagnosis not present

## 2023-11-17 DIAGNOSIS — J439 Emphysema, unspecified: Secondary | ICD-10-CM | POA: Insufficient documentation

## 2023-11-17 DIAGNOSIS — C50312 Malignant neoplasm of lower-inner quadrant of left female breast: Secondary | ICD-10-CM | POA: Diagnosis present

## 2023-11-17 DIAGNOSIS — I2510 Atherosclerotic heart disease of native coronary artery without angina pectoris: Secondary | ICD-10-CM | POA: Diagnosis not present

## 2023-11-17 DIAGNOSIS — I3139 Other pericardial effusion (noninflammatory): Secondary | ICD-10-CM | POA: Insufficient documentation

## 2023-11-17 DIAGNOSIS — J90 Pleural effusion, not elsewhere classified: Secondary | ICD-10-CM | POA: Diagnosis not present

## 2023-11-17 DIAGNOSIS — I871 Compression of vein: Secondary | ICD-10-CM | POA: Diagnosis not present

## 2023-11-17 MED ORDER — IOHEXOL 350 MG/ML SOLN
100.0000 mL | Freq: Once | INTRAVENOUS | Status: AC | PRN
  Administered 2023-11-17: 100 mL via INTRAVENOUS

## 2023-11-17 NOTE — Progress Notes (Signed)
 Patient for US  guided Core RT supraclavicular mass biopsy on Thurs 11/18/23, I called and spoke with the patient on the phone and gave pre-procedure instructions. Pt was made aware to be here at 1p and [facility] in at the [facility] desk. Pt stated understanding. Called 11/17/23

## 2023-11-18 ENCOUNTER — Telehealth: Payer: Self-pay | Admitting: *Deleted

## 2023-11-18 ENCOUNTER — Ambulatory Visit
Admission: RE | Admit: 2023-11-18 | Discharge: 2023-11-18 | Disposition: A | Discharge status: Home / Self Care | Source: Ambulatory Visit | Attending: Internal Medicine | Admitting: Internal Medicine

## 2023-11-18 ENCOUNTER — Telehealth: Payer: Self-pay | Admitting: Internal Medicine

## 2023-11-18 ENCOUNTER — Other Ambulatory Visit

## 2023-11-18 ENCOUNTER — Encounter: Payer: Self-pay | Admitting: Radiation Oncology

## 2023-11-18 ENCOUNTER — Ambulatory Visit
Admission: RE | Admit: 2023-11-18 | Discharge: 2023-11-18 | Disposition: A | Discharge status: Home / Self Care | Source: Ambulatory Visit | Attending: Radiation Oncology | Admitting: Radiation Oncology

## 2023-11-18 VITALS — BP 144/81 | HR 114 | Temp 98.6°F | Resp 16 | Ht 62.0 in | Wt 133.3 lb

## 2023-11-18 DIAGNOSIS — Z510 Encounter for antineoplastic radiation therapy: Secondary | ICD-10-CM | POA: Insufficient documentation

## 2023-11-18 DIAGNOSIS — I871 Compression of vein: Secondary | ICD-10-CM | POA: Diagnosis not present

## 2023-11-18 DIAGNOSIS — C781 Secondary malignant neoplasm of mediastinum: Secondary | ICD-10-CM | POA: Insufficient documentation

## 2023-11-18 DIAGNOSIS — Z923 Personal history of irradiation: Secondary | ICD-10-CM | POA: Insufficient documentation

## 2023-11-18 DIAGNOSIS — Z170 Estrogen receptor positive status [ER+]: Secondary | ICD-10-CM

## 2023-11-18 DIAGNOSIS — J9859 Other diseases of mediastinum, not elsewhere classified: Secondary | ICD-10-CM

## 2023-11-18 DIAGNOSIS — C50312 Malignant neoplasm of lower-inner quadrant of left female breast: Secondary | ICD-10-CM | POA: Insufficient documentation

## 2023-11-18 MED ORDER — LIDOCAINE 1 % OPTIME INJ - NO CHARGE
10.0000 mL | Freq: Once | INTRAMUSCULAR | Status: AC
  Administered 2023-11-18: 10 mL via INTRADERMAL
  Filled 2023-11-18: qty 10

## 2023-11-18 MED ORDER — LIDOCAINE HCL (PF) 1 % IJ SOLN
10.0000 mL | Freq: Once | INTRAMUSCULAR | Status: AC
  Administered 2023-11-18: 10 mL via INTRADERMAL
  Filled 2023-11-18: qty 10

## 2023-11-18 NOTE — Telephone Encounter (Signed)
 For the patient she was given the date today and and was told that the the biopsy will take a few minutes and she can eat before this.  And she got another appointment and it said that she has to come at 1:00 for 1:30 needle biopsy and she wants to know if she is going to do the biopsy at 1 PM due to she already ate.  Have called over to vascular to get in touch with Clarita but I left her a message about this.

## 2023-11-18 NOTE — Discharge Instructions (Signed)
 Reviewed with patient

## 2023-11-18 NOTE — Consult Note (Signed)
 NEW PATIENT EVALUATION  Name: Dana Snow  MRN: [id number]  Date:   11/18/2023     DOB: 1945-10-17   This 78 y.o. 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  She had been on Aromasin  and stopped that in 2021.  Most recently she has complained of increasing shortness of breath dyspnea on exertion.  She also has developed facial plethora as well as collateral vessels on her anterior chest wall.  PET scan in July showed confluent abnormal soft tissue along the upper mediastinum with involvement of the right lung hilum and nodes involving the thoracic inlet on the right side adjacent to the right clavicle.  There are also 4 hypermetabolic small bony lesions within the thoracic spine at T1-T2 levels.  Also right acetabulum left scapula worrisome for metastatic disease.  She is scheduled for biopsy today.  She had a CT venogram yesterday showing interval increase in size of infiltrative tissue in the upper mediastinum encasing the roots of the great vessels of the aortic arch left innominate vein and SVC.  There is high-grade narrowing the central SVC for approximately 5 cm.  She is seen today for evaluation of SVC.  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  PLANNED TREATMENT REGIMEN:  Palliative radiation therapy to chest  PAST MEDICAL HISTORY:  has a past medical history of Breast cancer (HCC) (2015), History of COVID-19, Hypertension, and Personal history of radiation therapy (2016).    PAST SURGICAL HISTORY:  Past Surgical History:  Procedure Laterality Date   ABDOMINAL HYSTERECTOMY     BREAST BIOPSY Left 03/15/2014   8:00 us  bx, SCLEROSING ADENOSIS AND COLUMNAR CELL CHANGE    BREAST BIOPSY Left 02/27/2014   invasive lobular carcinoma LIQ   BREAST LUMPECTOMY Left 04/03/2014   [facility], DCIS, AL[facility], negative LN   BREAST LUMPECTOMY Left 04/27/2014   re-excision for clear margins   COMBINED HYSTEROSCOPY DIAGNOSTIC / D&C     PARTIAL MASTECTOMY WITH AXILLARY SENTINEL LYMPH NODE BIOPSY Left     FAMILY HISTORY: family history is not on file.  SOCIAL HISTORY:  reports that she has quit smoking. Her smoking use included cigarettes. She has never used smokeless tobacco. She reports current alcohol use of about 2.0 standard drinks of alcohol per week. She reports that she does not use drugs.  ALLERGIES: Acetaminophen, Codeine, Codone [hydrocodone], Oxycodone hcl, Percocet [oxycodone-acetaminophen], and Penicillins  MEDICATIONS:  Current Outpatient Medications  Medication Sig Dispense Refill   Calcium-Vitamin D  600-200 MG-UNIT per tablet Take 1 tablet by mouth daily.     furosemide (LASIX) 20 MG tablet Take 20 mg by mouth daily.     potassium chloride (KLOR-CON) 10 MEQ tablet Take 10 mEq by mouth once.     vitamin C (ASCORBIC ACID) 500 MG tablet Take 500 mg by mouth daily.     No current  facility-administered medications for this encounter.    ECOG PERFORMANCE STATUS:  1 - Symptomatic but completely ambulatory  REVIEW OF SYSTEMS: History of breast cancer Patient denies any weight loss, fatigue, weakness, fever, chills or night sweats. Patient denies any loss of vision, blurred vision. Patient denies any ringing  of the ears or hearing loss. No irregular heartbeat. Patient  denies heart murmur or history of fainting. Patient denies any chest pain or pain radiating to her upper extremities. Patient denies any shortness of breath, difficulty breathing at night, cough or hemoptysis. Patient denies any swelling in the lower legs. Patient denies any nausea vomiting, vomiting of blood, or coffee ground material in the vomitus. Patient denies any stomach pain. Patient states has had normal bowel movements no significant constipation or diarrhea. Patient denies any dysuria, hematuria or significant nocturia. Patient denies any problems walking, swelling in the joints or loss of balance. Patient denies any skin changes, loss of hair or loss of weight. Patient denies any excessive worrying or anxiety or significant depression. Patient denies any problems with insomnia. Patient denies excessive thirst, polyuria, polydipsia. Patient denies any swollen glands, patient denies easy bruising or easy bleeding. Patient denies any recent infections, allergies or URI. Patient s visual fields have not changed significantly in recent time.   PHYSICAL EXAM: BP (!) 144/81   Pulse (!) 114   Temp 98.6 F (37 C) (Tympanic)   Resp 16   Ht 5' 2 (1.575 m)   Wt 133 lb 4.8 oz (60.5 kg)   SpO2 98%   PF (!) 0 L/min   BMI 24.38 kg/m  Patient has flexion facial plethora venous jugular distention and collateral vessels on her anterior chest wall all consistent with SVC. Patient denies any weight loss, fatigue, weakness, fever, chills or night sweats. Patient denies any loss of vision, blurred vision. Patient denies any ringing  of the ears or hearing loss. No irregular heartbeat. Patient denies heart murmur or history of fainting. Patient denies any chest pain or pain radiating to her upper extremities. Patient denies any shortness of breath, difficulty breathing at night, cough or hemoptysis. Patient denies any swelling in the lower legs. Patient denies any nausea vomiting, vomiting of blood, or coffee  ground material in the vomitus. Patient denies any stomach pain. Patient states has had normal bowel movements no significant constipation or diarrhea. Patient denies any dysuria, hematuria or significant nocturia. Patient denies any problems walking, swelling in the joints or loss of balance. Patient denies any skin changes, loss of hair or loss of weight. Patient denies any excessive worrying or anxiety or significant depression. Patient denies any problems with insomnia. Patient denies excessive thirst, polyuria, polydipsia. Patient denies any swollen glands, patient denies easy bruising or easy bleeding. Patient denies any recent infections, allergies or URI. Patient s visual fields have not changed significantly in recent time.  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  PLAN: At this time I do not believe this is most likely related to her breast cancer although biopsy will clarify that.  This may be either lymphoma or lung cancer.  We need to urgently start radiation therapy and I have planned simulation for early next week and will have her under treatment next week.  Risks and benefits of treatment including possible dysphagia skin reaction fatigue alteration of blood counts cough all were described in detail the  patient.  I will plan on delivering 40 Gray in 20 fractions using 3D treatment planning.  Appointments were made we will follow-up on her biopsy when that is finalized.  Patient and husband both comprehend our treatment plan well.  I would like to take this opportunity to thank you for allowing me to participate in the care of your patient.SABRA Marcey Penton, MD

## 2023-11-18 NOTE — Telephone Encounter (Signed)
 pt calling confused and states she was told her biopsy was suppose to be this morning at 9:30 and it would only take a couple of minutes. I advised the pt that the biopsy appt is today at 1:30 and to arrive at 1 pm. she states that's not what she was told and she would like a callback. [phone]

## 2023-11-18 NOTE — Procedures (Signed)
 Interventional Radiology Procedure Note  Procedure: US  Guided Biopsy of right supraclavicular mass/lymph node  Complications: None  Estimated Blood Loss: < 10 mL  Findings: 18 G core biopsy of right supraclavicular mass performed under US  guidance.  Four core samples obtained and sent to Pathology.  Marcey DASEN. Luverne, M.D Pager:  [phone]

## 2023-11-22 ENCOUNTER — Telehealth: Payer: Self-pay | Admitting: Pharmacist

## 2023-11-22 ENCOUNTER — Telehealth: Payer: Self-pay | Admitting: *Deleted

## 2023-11-22 DIAGNOSIS — C50312 Malignant neoplasm of lower-inner quadrant of left female breast: Secondary | ICD-10-CM

## 2023-11-22 LAB — SURGICAL PATHOLOGY

## 2023-11-22 NOTE — Telephone Encounter (Signed)
 Clinical Pharmacist Practitioner Encounter   Received new prescription for Kisqali  (ribociclib ) for the treatment of recurrent metastatic breast cancer, planned duration until disease progression or unacceptable drug toxicity.  CMP from 11/15/23 assessed, no relevant lab abnormalities. Prescription dose and frequency assessed.   Patient will need baseline ECG.   Current medication list in Epic reviewed, no DDIs with ribociclib  identified.  Evaluated chart and no patient barriers to medication adherence identified.   Prescription has been e-scribed to the [facility] for benefits analysis and approval.  Oral Oncology Clinic will continue to follow for insurance authorization, copayment issues, initial counseling and start date.   Lorina Duffner N. Danni Leabo, PharmD, BCOP, CPP Hematology/Oncology Clinical Pharmacist [facility] Oral [facility] [phone]  11/22/2023 4:53 PM

## 2023-11-22 NOTE — Telephone Encounter (Signed)
 Spoke to [place] at [employer]. Requested add on testing to pathology ER/PR and HER2 to specimen # [id number] [phone]

## 2023-11-23 ENCOUNTER — Other Ambulatory Visit (HOSPITAL_COMMUNITY): Payer: Self-pay

## 2023-11-23 ENCOUNTER — Telehealth: Payer: Self-pay | Admitting: Pharmacy Technician

## 2023-11-23 ENCOUNTER — Ambulatory Visit
Admission: RE | Admit: 2023-11-23 | Discharge: 2023-11-23 | Disposition: A | Discharge status: Home / Self Care | Source: Ambulatory Visit | Attending: Radiation Oncology | Admitting: Radiation Oncology

## 2023-11-23 DIAGNOSIS — Z510 Encounter for antineoplastic radiation therapy: Secondary | ICD-10-CM | POA: Diagnosis not present

## 2023-11-23 NOTE — Telephone Encounter (Signed)
 Oral Oncology Patient Advocate Encounter  Prior Authorization for Kisqali  has been approved.    PA# [id number]  Effective dates: 11/23/2023 through 05/21/2024  Patients co-pay is $100.    Jordynn Marcella (Patty) Chet Burnet, CPhT  [facility], [place], [place] Oral Chemotherapy Patient Advocate Specialist III Phone: [phone]  Fax: [phone]

## 2023-11-23 NOTE — Telephone Encounter (Signed)
 Oral Oncology Patient Advocate Encounter   New authorization   Received notification that prior authorization for Kisqali  is required.   PA submitted on CMM via [employer] [id number]  Status is pending     Dana Snow (Dana Snow) Dana Snow, CPhT  Dana Snow - [facility], [place], [place] Oral Chemotherapy Patient Advocate Specialist III Phone: [phone]  Fax: [phone]

## 2023-11-24 DIAGNOSIS — Z510 Encounter for antineoplastic radiation therapy: Secondary | ICD-10-CM | POA: Diagnosis not present

## 2023-11-25 ENCOUNTER — Ambulatory Visit
Admission: RE | Admit: 2023-11-25 | Discharge: 2023-11-25 | Disposition: A | Discharge status: Home / Self Care | Source: Ambulatory Visit | Attending: Radiation Oncology | Admitting: Radiation Oncology

## 2023-11-25 ENCOUNTER — Other Ambulatory Visit: Payer: Self-pay

## 2023-11-25 DIAGNOSIS — Z510 Encounter for antineoplastic radiation therapy: Secondary | ICD-10-CM | POA: Diagnosis not present

## 2023-11-25 LAB — RAD ONC ARIA SESSION SUMMARY
Course Elapsed Days: 0
Plan Fractions Treated to Date: 1
Plan Prescribed Dose Per Fraction: 2 Gy
Plan Total Fractions Prescribed: 20
Plan Total Prescribed Dose: 40 Gy
Reference Point Dosage Given to Date: 2 Gy
Reference Point Session Dosage Given: 2 Gy
Session Number: 1

## 2023-11-26 ENCOUNTER — Inpatient Hospital Stay: Admitting: Internal Medicine

## 2023-11-26 ENCOUNTER — Encounter: Payer: Self-pay | Admitting: Internal Medicine

## 2023-11-26 ENCOUNTER — Telehealth: Payer: Self-pay | Admitting: Pharmacy Technician

## 2023-11-26 ENCOUNTER — Other Ambulatory Visit: Payer: Self-pay | Admitting: Pharmacy Technician

## 2023-11-26 ENCOUNTER — Ambulatory Visit
Admission: RE | Admit: 2023-11-26 | Discharge: 2023-11-26 | Disposition: A | Discharge status: Home / Self Care | Source: Ambulatory Visit | Attending: Radiation Oncology | Admitting: Radiation Oncology

## 2023-11-26 ENCOUNTER — Other Ambulatory Visit: Payer: Self-pay

## 2023-11-26 VITALS — BP 90/71 | HR 92 | Temp 97.0°F | Resp 20 | Ht 62.0 in | Wt 128.8 lb

## 2023-11-26 DIAGNOSIS — Z510 Encounter for antineoplastic radiation therapy: Secondary | ICD-10-CM | POA: Diagnosis not present

## 2023-11-26 DIAGNOSIS — Z170 Estrogen receptor positive status [ER+]: Secondary | ICD-10-CM

## 2023-11-26 DIAGNOSIS — C50312 Malignant neoplasm of lower-inner quadrant of left female breast: Secondary | ICD-10-CM

## 2023-11-26 LAB — RAD ONC ARIA SESSION SUMMARY
Course Elapsed Days: 1
Plan Fractions Treated to Date: 2
Plan Prescribed Dose Per Fraction: 2 Gy
Plan Total Fractions Prescribed: 20
Plan Total Prescribed Dose: 40 Gy
Reference Point Dosage Given to Date: 4 Gy
Reference Point Session Dosage Given: 2 Gy
Session Number: 2

## 2023-11-26 MED ORDER — ONDANSETRON HCL 8 MG PO TABS
ORAL_TABLET | ORAL | 1 refills | Status: AC
Start: 2023-11-26 — End: ?

## 2023-11-26 MED ORDER — RIBOCICLIB SUCC (400 MG DOSE) 200 MG PO TBPK
400.0000 mg | ORAL_TABLET | Freq: Every day | ORAL | 0 refills | Status: DC
  Filled 2023-11-26: qty 42, 28d supply, fill #0

## 2023-11-26 MED ORDER — LETROZOLE 2.5 MG PO TABS: 2.5000 mg | ORAL_TABLET | Freq: Every day | ORAL | 1 refills | Status: DC

## 2023-11-26 NOTE — Progress Notes (Signed)
 Bx 11/18/23 lymph node.   C/o fatigue, weakness, nausea, intestinal gas. No appetite, trying different protein shakes. Not really having a formed BM it is just oozing out. DOE.

## 2023-11-26 NOTE — Progress Notes (Signed)
 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 Smith]]  ER/PR- >90%; Her 2 Nue- NEG; Lumpec & SLNBx Oncotype-RS=19 No Chemo; s/p RT [Dr.Crystal] April 2016- Arimidex; Stopped Oct 2016; START OCT 2016- Femara ; Sep 19th stopped sec to [employer]; sep 19th 2019- START AROMASIN ; stop summer 2021.  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 PET scan- JULY 2025- Confluent abnormal soft tissue along the upper mediastinum. More towards the right side with involvement of the right lung hilum and separate tissue or nodes thoracic inlet on the right side adjacent to the right clavicle corresponding to the finding by prior CT scan. here are 4 hypermetabolic small bony lesions identified including thoracic spine at T1 and T2, the right acetabulum and left scapula worrisome for bony metastatic disease. A thoracic spine MRI may be of some benefit to further characterize the spine lesion. Two small nonspecific areas of asymmetric uptake along skeletal muscle of the left gluteus medius muscle and the left pectoralis muscle.   NGS-pending.    # BMD- Osteopenia [may 2016]; AUG 2018- T score= -1.1 [improved] ------------------------------------------------------------   DIAGNOSIS: BREAST CANCER    CURRENT/MOST RECENT THERAPY: Surveillance.    Carcinoma of lower-inner quadrant of left breast in female, estrogen receptor positive (HCC)  04/12/2020 Cancer Staging   Staging form: Breast, AJCC 8th Edition - Clinical: Stage IB (cT2, cN0, cM0, G2, ER+, PR+, HER2-) - Signed by  Dana Hockett R, MD on 04/12/2020     HISTORY OF PRESENTING ILLNESS: Patient ambulating-independently. Accompanied by husband,  Dana Snow 78 y.o.  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  Patient's status post evaluation with radiation oncology-currently started on radiation.  Today fraction #2  Patient has been started on diuretics by cardiology given her facial swelling and ongoing shortness of breath.  Patient complains of ongoing fatigue poor appetite.  Complains of dizziness especially on standing.  No falls.   Review of Systems  Constitutional:  Positive for malaise/fatigue and weight loss. Negative for chills, diaphoresis and fever.  HENT:  Negative for nosebleeds and sore throat.   Eyes:  Negative for double vision.  Respiratory:  Positive for cough and shortness of breath. Negative for hemoptysis, sputum production and wheezing.   Cardiovascular:  Negative for chest pain, palpitations, orthopnea and leg swelling.  Gastrointestinal:  Negative for abdominal pain, blood in stool, constipation, diarrhea, heartburn, melena, nausea and vomiting.  Genitourinary:  Negative for dysuria, frequency and urgency.  Musculoskeletal:  Negative for back pain and joint pain.  Skin: Negative.  Negative for itching and rash.  Neurological:  Positive for weakness. Negative for dizziness, tingling, focal weakness and headaches.  Endo/Heme/Allergies:  Does not bruise/bleed easily.  Psychiatric/Behavioral:  Negative for depression. The patient is not nervous/anxious and does not have insomnia.     MEDICAL HISTORY:  Past Medical History:  Diagnosis Date   Breast cancer (HCC) 2015   left lumpectomy 04/05/2014 and reexcision  04/27/2014 - radiation treatment   History  of COVID-19    Hypertension    Personal history of radiation therapy 2016   Left breast    SURGICAL HISTORY: Past Surgical History:  Procedure Laterality Date   ABDOMINAL HYSTERECTOMY     BREAST BIOPSY Left 03/15/2014   8:00 us  bx, SCLEROSING ADENOSIS AND COLUMNAR CELL CHANGE    BREAST BIOPSY Left 02/27/2014   invasive lobular carcinoma LIQ   BREAST LUMPECTOMY Left 04/03/2014   [facility], DCIS, [facility], negative LN   BREAST LUMPECTOMY Left 04/27/2014   re-excision for clear margins   COMBINED HYSTEROSCOPY DIAGNOSTIC / D&C     PARTIAL MASTECTOMY WITH AXILLARY SENTINEL LYMPH NODE BIOPSY Left     SOCIAL HISTORY: Social History   Socioeconomic History   Marital status: Married    Spouse name: Not on file   Number of children: Not on file   Years of education: Not on file   Highest education level: Not on file  Occupational History   Not on file  Tobacco Use   Smoking status: Former    Current packs/day: 1.50    Types: Cigarettes   Smokeless tobacco: Never  Vaping Use   Vaping status: Never Used  Substance and Sexual Activity   Alcohol use: Yes    Alcohol/week: 2.0 standard drinks of alcohol    Types: 2 Glasses of wine per week   Drug use: Never   Sexual activity: Never  Other Topics Concern   Not on file  Social History Narrative   Not on file   Social Drivers of Health   Financial Resource Strain: Low Risk  (03/11/2023)   Received from [facility]   Overall Financial Resource Strain (CARDIA)    Difficulty of Paying Living Expenses: Not hard at all  Food Insecurity: No Food Insecurity (11/15/2023)   Hunger Vital Sign    Worried About Running Out of Food in the Last Year: Never true    Ran Out of Food in the Last Year: Never true  Transportation Needs: No Transportation Needs (11/15/2023)   PRAPARE - Administrator, Civil Service (Medical): No    Lack of Transportation (Non-Medical): No  Physical Activity: Not on file  Stress: Not on file   Social Connections: Not on file  Intimate Partner Violence: Not At Risk (11/15/2023)   Humiliation, Afraid, Rape, and Kick questionnaire    Fear of Current or Ex-Partner: No    Emotionally Abused: No    Physically Abused: No    Sexually Abused: No    FAMILY HISTORY: Family History  Problem Relation Age of Onset   Breast cancer Neg Hx     ALLERGIES:  is allergic to acetaminophen, codeine, codone [hydrocodone], oxycodone hcl, percocet [oxycodone-acetaminophen], and penicillins.  MEDICATIONS:  Current Outpatient Medications  Medication Sig Dispense Refill   Calcium-Vitamin D  600-200 MG-UNIT per tablet Take 1 tablet by mouth daily.     diltiazem (CARDIZEM CD) 120 MG 24 hr capsule Take 120 mg by mouth daily.     metoprolol tartrate (LOPRESSOR) 25 MG tablet Take 25 mg by mouth 2 (two) times daily.     ondansetron  (ZOFRAN ) 8 MG tablet One pill every 8 hours as needed for nausea/vomitting. 40 tablet 1   potassium chloride (KLOR-CON) 10 MEQ tablet Take 10 mEq by mouth once.     torsemide (DEMADEX) 20 MG tablet Take 20 mg by mouth in the morning, at noon, in the evening, and at bedtime.  vitamin C (ASCORBIC ACID) 500 MG tablet Take 500 mg by mouth daily.     letrozole  (FEMARA ) 2.5 MG tablet Take 1 tablet (2.5 mg total) by mouth daily. 90 tablet 1   ribociclib  succ (KISQALI  400MG  DAILY DOSE) 200 MG Therapy Pack Take 2 tablets (400 mg total) by mouth daily. Take for 21 days on, 7 days off, repeat every 28 days. 42 tablet 0   No current facility-administered medications for this visit.    PHYSICAL EXAMINATION:   Vitals:   11/26/23 0906  BP: 90/71  Pulse: 92  Resp: 20  Temp: (!) 97 F (36.1 C)  SpO2: 99%   Filed Weights   11/26/23 0906  Weight: 58.4 kg   Facial puffiness noted.  Right pupil smaller- and drooping noted.   Venous engorgement on the chest noted.   Physical Exam Vitals and nursing note reviewed.  HENT:     Head: Normocephalic and atraumatic.      Mouth/Throat:     Pharynx: Oropharynx is clear.  Eyes:     Extraocular Movements: Extraocular movements intact.     Pupils: Pupils are equal, round, and reactive to light.  Cardiovascular:     Rate and Rhythm: Normal rate and regular rhythm.  Pulmonary:     Comments: Decreased breath sounds bilaterally.  Abdominal:     Palpations: Abdomen is soft.  Musculoskeletal:        General: Normal range of motion.     Cervical back: Normal range of motion.  Skin:    General: Skin is warm.  Neurological:     General: No focal deficit present.     Mental Status: She is alert and oriented to person, place, and time.  Psychiatric:        Behavior: Behavior normal.        Judgment: Judgment normal.     LABORATORY DATA:  I have reviewed the data as listed Lab Results  Component Value Date   WBC 8.1 11/15/2023   HGB 14.7 11/15/2023   HCT 44.6 11/15/2023   MCV 94.5 11/15/2023   PLT 277 11/15/2023   Recent Labs    08/15/23 1343 08/15/23 1635 11/15/23 1210  NA 133* 135 130*  K 3.7 4.1 4.6  CL 99 99 93*  CO2 27 24 26   GLUCOSE 116* 86 101*  BUN 14 13 15   CREATININE 0.75 0.66 0.82  CALCIUM 9.6 9.5 10.0  GFRNONAA >60 >60 >60  PROT  --  8.3* 7.5  ALBUMIN  --  4.7 4.3  AST  --  22 21  ALT  --  17 21  ALKPHOS  --  60 56  BILITOT  --  0.9 1.0    RADIOGRAPHIC STUDIES: I have personally reviewed the radiological images as listed and agreed with the findings in the report. US  CORE BIOPSY (LYMPH NODES) Result Date: 11/18/2023 INDICATION: Right supraclavicular soft tissue mass and infiltrative tumor in the mediastinum causing SVC syndrome. History of breast carcinoma with presumed recurrence. EXAM: ULTRASOUND GUIDED CORE BIOPSY OF RIGHT SUPRACLAVICULAR SOFT TISSUE MASS MEDICATIONS: None. ANESTHESIA/SEDATION: None PROCEDURE: The procedure, risks, benefits, and alternatives were explained to the patient. Questions regarding the procedure were encouraged and answered. The patient understands  and consents to the procedure. A time out was performed prior to initiating the procedure. The right neck was prepped with chlorhexidine in a sterile fashion, and a sterile drape was applied covering the operative field. A sterile gown and sterile gloves were used for the procedure. Local  anesthesia was provided with 1% Lidocaine . Ultrasound was used to localize abnormal soft tissue in the right neck. Under ultrasound guidance, a 17 gauge trocar needle was advanced to the margin of the mass. Coaxial 18 gauge core biopsy samples were obtained with 4 passes made. Material was placed on saline soaked Telfa gauze. Additional ultrasound was performed. COMPLICATIONS: None immediate. FINDINGS: At first it was very difficult to localize a discrete soft tissue mass in the right supraclavicular region. Utilizing a chest CT performed yesterday for landmarks, abnormal vaguely marginated hypoechoic soft tissue was identified lying lateral to the right common carotid artery and corresponding in location to the CT abnormality. This measures approximately 3.4 cm in transverse diameter which corresponds well in size to the CT scan. The tissue was very firm by biopsy and yielded 1 completely intact and 3 partially fragmented core biopsy samples. IMPRESSION: Ultrasound-guided core biopsy of right supraclavicular mass seen as hypoechoic soft tissue by ultrasound measuring up to 3.4 cm in estimated diameter. Core biopsy yielded solid and fragmented tissue. Electronically Signed   By: Marcey Moan M.D.   On: 11/18/2023 16:22   CT VENOGRAM CHEST Result Date: 11/17/2023 CLINICAL DATA:  SVC syndrome. History of breast cancer and mediastinal mass. EXAM: CT VENORAPHY CHEST WITH CONTRAST TECHNIQUE: Multidetector CT imaging of the chest was performed using the standard protocol during bolus administration of intravenous contrast. Multiplanar CT image reconstructions and MIPs were obtained to evaluate the vascular anatomy. RADIATION DOSE  REDUCTION: This exam was performed according to the departmental dose-optimization program which includes automated exposure control, adjustment of the mA and/or kV according to patient size and/or use of iterative reconstruction technique. CONTRAST:  OMNIPAQUE  IOHEXOL  350 MG/ML SOLN COMPARISON:  Chest CT dated 08/15/2023. FINDINGS: Cardiovascular: There is no cardiomegaly. There is 3 vessel coronary vascular calcification. Moderate pericardial effusion increased in size since the prior CT measuring up to 17 mm in thickness. Mild atherosclerotic calcification of the thoracic aorta. No aneurysmal dilatation or dissection. The origins of the great vessels of the aortic arch and the central pulmonary arteries are patent. Apparent filling defects within the central vessels in the right hilum (coronal series 5 images 52 and 53) are suboptimally evaluated but may be within the pulmonary artery branches and represent nonocclusive PEs. Alternatively these may be extension of infiltrative tissue and tumor thrombus within the pulmonary vasculature. Mediastinum/Nodes: There is infiltrative tissue in the upper mediastinum encasing the roots of the branches of the great vessels of the aortic arch, left innominate vein, and the SVC. Interval increase in the size of the mediastinal tissue since the prior CT. For example a component of the tissue encasing the origin of the left common carotid artery measures approximately 3.8 x 3.8 cm (previously 2.7 x 3.4 cm). There is high-grade narrowing of the central SVC involving approximately 5 cm length of the vessel. There is also narrowing of the innominate vein. Infiltrating tissue abuts the anterior trachea and appears to encase the bifurcation of the trachea. There is encasement of the proximal right subclavian artery with mild focal narrowing of this vessel. The origins of the great vessels of the aortic arch however remain patent. Several rounded small mediastinal lymph nodes  noted. There is a 12 x 16 mm low attenuating nodule in the right hilum which may represent infiltrative mass or adenopathy. There is mild infiltration of the fat plane along the anterior esophagus. Lungs/Pleura: Background of emphysema. Small right pleural effusion, new since the prior CT. There is partial  compressive atelectasis of the right lower lobe. Pneumonia is not excluded. There is no pneumothorax. The central airways remain patent. Upper Abdomen: No acute abnormality. Musculoskeletal: Small lucent lesion in the posterior aspect of the T2 suspicious for metastatic disease. No acute osseous pathology. Review of the MIP images confirms the above findings. IMPRESSION: 1. Interval increase in the size of the infiltrative tissue in the upper mediastinum encasing the roots of the great vessels of the aortic arch, left innominate vein, and the SVC. There is high-grade narrowing of the central SVC involving approximately 5 cm length of the vessel. There is also narrowing of the innominate vein. 2. Apparent filling defects within the central vessels in the right hilum are suboptimally evaluated but may be within the pulmonary artery branches and represent nonocclusive PEs. Alternatively these may be extension of infiltrative tissue and tumor thrombus within the pulmonary vasculature. 3. Small right pleural effusion with partial compressive atelectasis of the right lower lobe. Pneumonia is not excluded. 4. Moderate pericardial effusion increased in size since the prior CT. 5. Small lucent lesion in the posterior aspect of the T2 suspicious for metastatic disease. 6. Aortic Atherosclerosis (ICD10-I70.0) and Emphysema (ICD10-J43.9). These results will be called to the ordering clinician or representative by the Radiologist Assistant, and communication documented in the PACS or [employer]. Electronically Signed   By: Vanetta Chou M.D.   On: 11/17/2023 18:13   MR CARDIAC MORPHOLOGY W WO CONTRAST Result Date:  11/10/2023 CLINICAL DATA:  Shortness of breath, tachycardia EXAM: CARDIAC MRI TECHNIQUE: The patient was scanned on a 1.5 Tesla Siemens magnet. A dedicated cardiac coil was used. Functional imaging was done using Fiesta sequences. 2,3, and 4 chamber views were done to assess for RWMA's. Modified Simpson's rule using a short axis stack was used to calculate an ejection fraction on a dedicated work [employer]. The patient received 9 cc of Gadavist . After 10 minutes inversion recovery sequences were used to assess for infiltration and scar tissue. Velocity flow mapping performed in the ascending aorta and main pulmonary artery. CONTRAST:  9 cc  of Gadavist  FINDINGS: 1. Normal left ventricular size, thickness and systolic function (LVEF = 59%). There are no regional wall motion abnormalities. There is no late gadolinium enhancement in the left ventricular myocardium. SV: 39 ml CO: 4.1 L/min Myocardial mass: 60 g LV native T1 value 996 ms (normal <1000 ms) LV ECV value 22% (normal <30%) 2. Normal right ventricular size, thickness and systolic function (RVEF = 69%). There are no regional wall motion abnormalities. 3.  Normal left and right atrial size. 4. Normal size of the aortic root, ascending aorta and pulmonary artery. 5. Mild aortic valve regurgitation. no significant valvular abnormalities. 6.  Normal pericardium. Small pericardial effusion. IMPRESSION: 1.  Normal LV systolic function.  LVEF 59%. 2.  No LGE or scar. 3.  Normal RV systolic function. 4.  Mild aortic valve regurgitation. 5.  No evidence for infiltrative disease. Electronically Signed   By: Redell Cave M.D.   On: 11/10/2023 15:01   MR CARDIAC VELOCITY FLOW MAP Result Date: 11/10/2023 CLINICAL DATA:  Shortness of breath, tachycardia EXAM: CARDIAC MRI TECHNIQUE: The patient was scanned on a 1.5 Tesla Siemens magnet. A dedicated cardiac coil was used. Functional imaging was done using Fiesta sequences. 2,3, and 4 chamber views  were done to assess for RWMA's. Modified Simpson's rule using a short axis stack was used to calculate an ejection fraction on a dedicated work [employer].  The patient received 9 cc of Gadavist . After 10 minutes inversion recovery sequences were used to assess for infiltration and scar tissue. Velocity flow mapping performed in the ascending aorta and main pulmonary artery. CONTRAST:  9 cc  of Gadavist  FINDINGS: 1. Normal left ventricular size, thickness and systolic function (LVEF = 59%). There are no regional wall motion abnormalities. There is no late gadolinium enhancement in the left ventricular myocardium. SV: 39 ml CO: 4.1 L/min Myocardial mass: 60 g LV native T1 value 996 ms (normal <1000 ms) LV ECV value 22% (normal <30%) 2. Normal right ventricular size, thickness and systolic function (RVEF = 69%). There are no regional wall motion abnormalities. 3.  Normal left and right atrial size. 4. Normal size of the aortic root, ascending aorta and pulmonary artery. 5. Mild aortic valve regurgitation. no significant valvular abnormalities. 6.  Normal pericardium. Small pericardial effusion. IMPRESSION: 1.  Normal LV systolic function.  LVEF 59%. 2.  No LGE or scar. 3.  Normal RV systolic function. 4.  Mild aortic valve regurgitation. 5.  No evidence for infiltrative disease. Electronically Signed   By: Redell Cave M.D.   On: 11/10/2023 15:01   MR CARDIAC VELOCITY FLOW MAP Result Date: 11/10/2023 CLINICAL DATA:  Shortness of breath, tachycardia EXAM: CARDIAC MRI TECHNIQUE: The patient was scanned on a 1.5 Tesla Siemens magnet. A dedicated cardiac coil was used. Functional imaging was done using Fiesta sequences. 2,3, and 4 chamber views were done to assess for RWMA's. Modified Simpson's rule using a short axis stack was used to calculate an ejection fraction on a dedicated work [employer]. The patient received 9 cc of Gadavist . After 10 minutes inversion recovery  sequences were used to assess for infiltration and scar tissue. Velocity flow mapping performed in the ascending aorta and main pulmonary artery. CONTRAST:  9 cc  of Gadavist  FINDINGS: 1. Normal left ventricular size, thickness and systolic function (LVEF = 59%). There are no regional wall motion abnormalities. There is no late gadolinium enhancement in the left ventricular myocardium. SV: 39 ml CO: 4.1 L/min Myocardial mass: 60 g LV native T1 value 996 ms (normal <1000 ms) LV ECV value 22% (normal <30%) 2. Normal right ventricular size, thickness and systolic function (RVEF = 69%). There are no regional wall motion abnormalities. 3.  Normal left and right atrial size. 4. Normal size of the aortic root, ascending aorta and pulmonary artery. 5. Mild aortic valve regurgitation. no significant valvular abnormalities. 6.  Normal pericardium. Small pericardial effusion. IMPRESSION: 1.  Normal LV systolic function.  LVEF 59%. 2.  No LGE or scar. 3.  Normal RV systolic function. 4.  Mild aortic valve regurgitation. 5.  No evidence for infiltrative disease. Electronically Signed   By: Redell Cave M.D.   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 PET scan- JULY 2025- Confluent abnormal soft tissue along the upper mediastinum. More towards the right side with involvement of the right lung hilum and separate tissue or nodes thoracic inlet on the right side adjacent to the right clavicle corresponding to the finding by prior CT scan. here are 4 hypermetabolic small bony lesions identified including thoracic spine at T1 and T2, the right acetabulum and left scapula worrisome for bony metastatic disease. A  thoracic spine MRI may be of some benefit to further characterize the spine  lesion. Two small nonspecific areas of asymmetric uptake along skeletal muscle of the left gluteus medius muscle and the left pectoralis muscle.  Awaiting on NGS.   # I reviewed the pathology and stage-as IV with the patient and family.  Discussed unfortunately her malignancy is not curable.  However treatments-could potentially help control the disease and help her live longer.  Life expectancy is usually in the order of years.  In general treatments are indefinite; until progression of disease or intolerance.   # Recommend starting patient on letrozole ; along with CDK- inhibitors. I discussed the mechanism of action of aromatase inhibitors-with blocking of estrogen to prevent breast cancer.  Also discussed the potential side effects including but not limited to arthralgias hot flashes and increased risk of osteoporosis.  I discussed regarding Ribociclib  3 weeks on 1 week off along with antiestrogen therapy.  I reviewed the potential side effects including but not limited to nausea vomiting diarrhea skin rash liver and kidney abnormalities; and also neutropenia.  I discussed with pharmacy-   EKG at baseline- WNL-  and also after starting treatment in appx 2 weeks.  Recommend starting at a lower dose-400 mg 3 weeks on 1 week off-given ongoing radiation/borderline performance status.  Also sent a prescription for Zofran .  Consider Zometa down the line.   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    # OSTEOPENIA- [BMD= T score- 1.4]; continue ca+ vit D; exercise; monitor closely on therapy  # Borderline low blood pressure-especially patient is dizzy - on diuretics/antihypertensive-discussed with Dr. Huey re: tapering of diuretics   # IV accesss: difficult with larger needles -patient reluctant with port.  However will discuss with Dr. Marea.  # DISPOSITION: # referral to nutrition re: malnutrition # follow up in 2 weeks- MD; labs- cbc/cmp; mag-  repeat EKG Dr.B  # 40 minutes face-to-face with the patient discussing the above plan of care; more than 50% of time spent on prognosis/ natural history; counseling and coordination.   Above plan of care was discussed with patient/family in detail.  My contact information was given to the patient/family.       Cindy JONELLE Joe, MD 11/26/2023 10:39 AM

## 2023-11-26 NOTE — Progress Notes (Signed)
 Specialty Pharmacy Initial Fill Coordination Note  Dana Snow is a 78 y.o. female contacted today regarding refills of specialty medication(s) Ribociclib  Succinate (KISQALI  400mg  Daily Dose) .  Patient requested Delivery  on 12/01/23  to verified address [address]   [place] [address]   Medication will be filled on 11/30/2023.   Patient is aware of $100 copayment.   Darragh Nay (Patty) Chet Burnet, CPhT  [facility], Zelda Salmon, [facility] Patient Advocate Specialist III Phone: [phone]  Fax: [phone]

## 2023-11-26 NOTE — Progress Notes (Signed)
 Patient education documented in EPIC note on 11/26/23.

## 2023-11-26 NOTE — Telephone Encounter (Signed)
 Clinical Pharmacist Practitioner Encounter   [facility] (Specialty) will deliver medication to patient on 12/01/23. She will start when she has medication in hand.  Patient Education I spoke with patient for overview of new oral chemotherapy medication: Kisqali  (ribociclib ) for the treatment of recurrent metastatic breast cancer, planned duration until disease progression or unacceptable drug toxicity.   Treatment goal: Palliative  Counseled patient on administration, dosing, side effects, monitoring, drug-food interactions, safe handling, storage, and disposal. Patient will take 2 tablets (400 mg total) by mouth daily. Take for 21 days on, 7 days off, repeat every 28 days.   Side effects include but not limited to: decreased wbc/hgb, fatigue, diarrhea, nausea, hair thinning.   Diarrhea: patient knows to use loperamide as needed and call the office if they are having four or more loose stool per day  ECG today 11/26/23, showed QTc wnl.   Reviewed with patient importance of keeping a medication schedule and plan for any missed doses.  After discussion with patient no patient barriers to medication adherence identified.   Distress evaluation: Distress thermometer completed during telephone call and reviewed with patient. Due to score, social work referral has not been sent.  Communication and Learning Assessment Primary learner: patient Barriers to learning: No barriers Preferred language: English Learning preferences: Listening Reading  Dana Snow voiced understanding and appreciation. All questions answered. Medication handout provided.  Provided patient with Oral Chemotherapy Navigation Clinic phone number. Patient knows to call the office with questions or concerns. Oral Chemotherapy Navigation Clinic will continue to follow.  Dana Snow N. Uday Jantz, PharmD, BCOP, CPP Hematology/Oncology Clinical Pharmacist [facility] Oral Chemotherapy Navigation  Clinic [phone]  11/26/2023 11:27 AM

## 2023-11-26 NOTE — Telephone Encounter (Signed)
 Patient successfully OnBoarded and drug education provided by pharmacist. Medication scheduled to be shipped on 09/02 for delivery on 09/03 from [facility] to patient's address. Patient also knows to call me at [phone] with any questions or concerns regarding receiving medication or if there is any unexpected change in co-pay.   Dana Snow (Dana Snow) Dana Snow, CPhT  [facility], Dana Snow, [facility] Patient Advocate Specialist III Phone: [phone]  Fax: [phone]

## 2023-11-26 NOTE — Assessment & Plan Note (Addendum)
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 PET scan- JULY 2025- Confluent abnormal soft tissue along the upper mediastinum. More towards the right side with involvement of the right lung hilum and separate tissue or nodes thoracic inlet on the right side adjacent to the right clavicle corresponding to the finding by prior CT scan. here are 4 hypermetabolic small bony lesions identified including thoracic spine at T1 and T2, the right acetabulum and left scapula worrisome for bony metastatic disease. A thoracic spine MRI may be of some benefit to further characterize the spine lesion. Two small nonspecific areas of asymmetric uptake along skeletal muscle of the left gluteus medius muscle and the left pectoralis muscle.  Awaiting on NGS.   # I reviewed the pathology and stage-as IV with the patient and family.  Discussed unfortunately her malignancy is not curable.  However treatments-could potentially help control the disease and help her live longer.  Life expectancy is usually in the order of years.  In general treatments are indefinite; until progression of disease or intolerance.   # Recommend starting patient on letrozole ; along with CDK- inhibitors. I discussed the mechanism of action of aromatase inhibitors-with blocking of estrogen to prevent breast cancer.  Also discussed the potential side effects including but not limited to arthralgias hot flashes and increased risk of osteoporosis.  I discussed regarding Ribociclib  3 weeks on 1 week off along with antiestrogen therapy.  I reviewed the potential side effects including but not limited to nausea vomiting diarrhea skin rash liver and kidney abnormalities; and also neutropenia.  I discussed with pharmacy-   EKG at baseline- WNL-  and also after starting treatment in appx 2 weeks.  Recommend starting at a lower dose-400 mg 3 weeks on 1  week off-given ongoing radiation/borderline performance status.  Also sent a prescription for Zofran .  Consider Zometa down the line.   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    # OSTEOPENIA- [BMD= T score- 1.4]; continue ca+ vit D; exercise; monitor closely on therapy  # Borderline low blood pressure-especially patient is dizzy - on diuretics/antihypertensive-discussed with Dr. Huey re: tapering of diuretics   # IV accesss: difficult with larger needles -patient reluctant with port.  However will discuss with Dr. Marea.  # DISPOSITION: # referral to nutrition re: malnutrition # follow up in 2 weeks- MD; labs- cbc/cmp; mag- repeat EKG Dr.B  # 40 minutes face-to-face with the patient discussing the above plan of care; more than 50% of time spent on prognosis/ natural history; counseling and coordination.

## 2023-11-30 ENCOUNTER — Other Ambulatory Visit: Payer: Self-pay

## 2023-11-30 ENCOUNTER — Ambulatory Visit
Admission: RE | Admit: 2023-11-30 | Discharge: 2023-11-30 | Disposition: A | Discharge status: Home / Self Care | Source: Ambulatory Visit | Attending: Radiation Oncology | Admitting: Radiation Oncology

## 2023-11-30 DIAGNOSIS — C781 Secondary malignant neoplasm of mediastinum: Secondary | ICD-10-CM | POA: Diagnosis not present

## 2023-11-30 DIAGNOSIS — R0602 Shortness of breath: Secondary | ICD-10-CM | POA: Diagnosis not present

## 2023-11-30 DIAGNOSIS — I871 Compression of vein: Secondary | ICD-10-CM | POA: Diagnosis present

## 2023-11-30 DIAGNOSIS — M7989 Other specified soft tissue disorders: Secondary | ICD-10-CM | POA: Diagnosis not present

## 2023-11-30 DIAGNOSIS — R634 Abnormal weight loss: Secondary | ICD-10-CM | POA: Insufficient documentation

## 2023-11-30 DIAGNOSIS — M8580 Other specified disorders of bone density and structure, unspecified site: Secondary | ICD-10-CM | POA: Diagnosis not present

## 2023-11-30 DIAGNOSIS — C50312 Malignant neoplasm of lower-inner quadrant of left female breast: Secondary | ICD-10-CM | POA: Insufficient documentation

## 2023-11-30 DIAGNOSIS — Z8616 Personal history of COVID-19: Secondary | ICD-10-CM | POA: Insufficient documentation

## 2023-11-30 DIAGNOSIS — I3139 Other pericardial effusion (noninflammatory): Secondary | ICD-10-CM | POA: Diagnosis not present

## 2023-11-30 DIAGNOSIS — Z170 Estrogen receptor positive status [ER+]: Secondary | ICD-10-CM | POA: Insufficient documentation

## 2023-11-30 DIAGNOSIS — Z87891 Personal history of nicotine dependence: Secondary | ICD-10-CM | POA: Insufficient documentation

## 2023-11-30 DIAGNOSIS — Z79899 Other long term (current) drug therapy: Secondary | ICD-10-CM | POA: Diagnosis not present

## 2023-11-30 DIAGNOSIS — Z17411 Hormone receptor positive with human epidermal growth factor receptor 2 negative status: Secondary | ICD-10-CM | POA: Diagnosis not present

## 2023-11-30 DIAGNOSIS — Z923 Personal history of irradiation: Secondary | ICD-10-CM | POA: Diagnosis not present

## 2023-11-30 DIAGNOSIS — J3800 Paralysis of vocal cords and larynx, unspecified: Secondary | ICD-10-CM | POA: Diagnosis not present

## 2023-11-30 DIAGNOSIS — R590 Localized enlarged lymph nodes: Secondary | ICD-10-CM | POA: Diagnosis not present

## 2023-11-30 DIAGNOSIS — Z510 Encounter for antineoplastic radiation therapy: Secondary | ICD-10-CM | POA: Insufficient documentation

## 2023-11-30 DIAGNOSIS — R531 Weakness: Secondary | ICD-10-CM | POA: Diagnosis not present

## 2023-11-30 DIAGNOSIS — Z880 Allergy status to penicillin: Secondary | ICD-10-CM | POA: Insufficient documentation

## 2023-11-30 DIAGNOSIS — R059 Cough, unspecified: Secondary | ICD-10-CM | POA: Insufficient documentation

## 2023-11-30 DIAGNOSIS — Z9012 Acquired absence of left breast and nipple: Secondary | ICD-10-CM | POA: Insufficient documentation

## 2023-11-30 DIAGNOSIS — H5702 Anisocoria: Secondary | ICD-10-CM | POA: Insufficient documentation

## 2023-11-30 DIAGNOSIS — R5383 Other fatigue: Secondary | ICD-10-CM | POA: Insufficient documentation

## 2023-11-30 DIAGNOSIS — I10 Essential (primary) hypertension: Secondary | ICD-10-CM | POA: Diagnosis not present

## 2023-11-30 DIAGNOSIS — R490 Dysphonia: Secondary | ICD-10-CM | POA: Insufficient documentation

## 2023-11-30 DIAGNOSIS — Z90710 Acquired absence of both cervix and uterus: Secondary | ICD-10-CM | POA: Insufficient documentation

## 2023-11-30 DIAGNOSIS — Z885 Allergy status to narcotic agent status: Secondary | ICD-10-CM | POA: Insufficient documentation

## 2023-11-30 LAB — RAD ONC ARIA SESSION SUMMARY
Course Elapsed Days: 5
Plan Fractions Treated to Date: 3
Plan Prescribed Dose Per Fraction: 2 Gy
Plan Total Fractions Prescribed: 20
Plan Total Prescribed Dose: 40 Gy
Reference Point Dosage Given to Date: 6 Gy
Reference Point Session Dosage Given: 2 Gy
Session Number: 3

## 2023-12-01 ENCOUNTER — Telehealth: Payer: Self-pay | Admitting: *Deleted

## 2023-12-01 ENCOUNTER — Ambulatory Visit
Admission: RE | Admit: 2023-12-01 | Discharge: 2023-12-01 | Disposition: A | Discharge status: Home / Self Care | Source: Ambulatory Visit | Attending: Radiation Oncology | Admitting: Radiation Oncology

## 2023-12-01 ENCOUNTER — Other Ambulatory Visit: Payer: Self-pay

## 2023-12-01 DIAGNOSIS — Z510 Encounter for antineoplastic radiation therapy: Secondary | ICD-10-CM | POA: Diagnosis not present

## 2023-12-01 LAB — RAD ONC ARIA SESSION SUMMARY
Course Elapsed Days: 6
Plan Fractions Treated to Date: 4
Plan Prescribed Dose Per Fraction: 2 Gy
Plan Total Fractions Prescribed: 20
Plan Total Prescribed Dose: 40 Gy
Reference Point Dosage Given to Date: 8 Gy
Reference Point Session Dosage Given: 2 Gy
Session Number: 4

## 2023-12-01 NOTE — Telephone Encounter (Signed)
 The patient talked to Dr. Lenn yesterday about her being stuffy and nasally and he told her to go and get over-the-counter claritin D.  She went and got it at the store and she says she had been best sleep medicine.  She usually gets up and drink some water and some lemonade and then she is good for today.  She says that she woke up being dizzy and she wants to know if that is a problem with the Claritin and it is a side effect for some people.  So she wanted to know what we can do to help her.  She also is on diltiazem, torsemide, metoprolol.  She wondered if that to blood pressure medicines and the torsemide change to different timings or does not matter.  She would like a callback when this is read and have something to help with her.

## 2023-12-01 NOTE — Telephone Encounter (Signed)
 Okay with antihistamine.  Again reminded the patient to call Dr. Florencio regarding adjustment of her blood pressure medications.  Recommend holding diuretic-given the ongoing dizzy spells.  Keep follow-ups as planned.  GB

## 2023-12-01 NOTE — Telephone Encounter (Signed)
 Pt.notified

## 2023-12-02 ENCOUNTER — Ambulatory Visit
Admission: RE | Admit: 2023-12-02 | Discharge: 2023-12-02 | Disposition: A | Discharge status: Home / Self Care | Source: Ambulatory Visit | Attending: Radiation Oncology | Admitting: Radiation Oncology

## 2023-12-02 ENCOUNTER — Inpatient Hospital Stay

## 2023-12-02 ENCOUNTER — Other Ambulatory Visit: Payer: Self-pay

## 2023-12-02 ENCOUNTER — Telehealth: Payer: Self-pay | Admitting: Internal Medicine

## 2023-12-02 DIAGNOSIS — I10 Essential (primary) hypertension: Secondary | ICD-10-CM | POA: Insufficient documentation

## 2023-12-02 DIAGNOSIS — I3139 Other pericardial effusion (noninflammatory): Secondary | ICD-10-CM | POA: Insufficient documentation

## 2023-12-02 DIAGNOSIS — I700 Atherosclerosis of aorta: Secondary | ICD-10-CM | POA: Insufficient documentation

## 2023-12-02 DIAGNOSIS — Z170 Estrogen receptor positive status [ER+]: Secondary | ICD-10-CM | POA: Insufficient documentation

## 2023-12-02 DIAGNOSIS — R600 Localized edema: Secondary | ICD-10-CM | POA: Insufficient documentation

## 2023-12-02 DIAGNOSIS — Z79811 Long term (current) use of aromatase inhibitors: Secondary | ICD-10-CM | POA: Insufficient documentation

## 2023-12-02 DIAGNOSIS — Z8616 Personal history of COVID-19: Secondary | ICD-10-CM | POA: Insufficient documentation

## 2023-12-02 DIAGNOSIS — C50312 Malignant neoplasm of lower-inner quadrant of left female breast: Secondary | ICD-10-CM | POA: Insufficient documentation

## 2023-12-02 DIAGNOSIS — R0602 Shortness of breath: Secondary | ICD-10-CM | POA: Insufficient documentation

## 2023-12-02 DIAGNOSIS — M8580 Other specified disorders of bone density and structure, unspecified site: Secondary | ICD-10-CM | POA: Insufficient documentation

## 2023-12-02 DIAGNOSIS — Z880 Allergy status to penicillin: Secondary | ICD-10-CM | POA: Insufficient documentation

## 2023-12-02 DIAGNOSIS — R601 Generalized edema: Secondary | ICD-10-CM | POA: Insufficient documentation

## 2023-12-02 DIAGNOSIS — Z90710 Acquired absence of both cervix and uterus: Secondary | ICD-10-CM | POA: Insufficient documentation

## 2023-12-02 DIAGNOSIS — R531 Weakness: Secondary | ICD-10-CM | POA: Insufficient documentation

## 2023-12-02 DIAGNOSIS — I871 Compression of vein: Secondary | ICD-10-CM | POA: Insufficient documentation

## 2023-12-02 DIAGNOSIS — H5702 Anisocoria: Secondary | ICD-10-CM | POA: Insufficient documentation

## 2023-12-02 DIAGNOSIS — K208 Other esophagitis without bleeding: Secondary | ICD-10-CM | POA: Insufficient documentation

## 2023-12-02 DIAGNOSIS — R059 Cough, unspecified: Secondary | ICD-10-CM | POA: Insufficient documentation

## 2023-12-02 DIAGNOSIS — J90 Pleural effusion, not elsewhere classified: Secondary | ICD-10-CM | POA: Insufficient documentation

## 2023-12-02 DIAGNOSIS — Z1732 Human epidermal growth factor receptor 2 negative status: Secondary | ICD-10-CM | POA: Insufficient documentation

## 2023-12-02 DIAGNOSIS — Z923 Personal history of irradiation: Secondary | ICD-10-CM | POA: Insufficient documentation

## 2023-12-02 DIAGNOSIS — N390 Urinary tract infection, site not specified: Secondary | ICD-10-CM | POA: Insufficient documentation

## 2023-12-02 DIAGNOSIS — Y842 Radiological procedure and radiotherapy as the cause of abnormal reaction of the patient, or of later complication, without mention of misadventure at the time of the procedure: Secondary | ICD-10-CM | POA: Insufficient documentation

## 2023-12-02 DIAGNOSIS — M7989 Other specified soft tissue disorders: Secondary | ICD-10-CM | POA: Insufficient documentation

## 2023-12-02 DIAGNOSIS — R490 Dysphonia: Secondary | ICD-10-CM | POA: Insufficient documentation

## 2023-12-02 DIAGNOSIS — Z885 Allergy status to narcotic agent status: Secondary | ICD-10-CM | POA: Insufficient documentation

## 2023-12-02 DIAGNOSIS — R634 Abnormal weight loss: Secondary | ICD-10-CM | POA: Insufficient documentation

## 2023-12-02 DIAGNOSIS — J439 Emphysema, unspecified: Secondary | ICD-10-CM | POA: Insufficient documentation

## 2023-12-02 DIAGNOSIS — Z79899 Other long term (current) drug therapy: Secondary | ICD-10-CM | POA: Insufficient documentation

## 2023-12-02 DIAGNOSIS — Z9012 Acquired absence of left breast and nipple: Secondary | ICD-10-CM | POA: Insufficient documentation

## 2023-12-02 DIAGNOSIS — Z87440 Personal history of urinary (tract) infections: Secondary | ICD-10-CM | POA: Insufficient documentation

## 2023-12-02 DIAGNOSIS — Z1721 Progesterone receptor positive status: Secondary | ICD-10-CM | POA: Insufficient documentation

## 2023-12-02 DIAGNOSIS — Z510 Encounter for antineoplastic radiation therapy: Secondary | ICD-10-CM | POA: Diagnosis not present

## 2023-12-02 DIAGNOSIS — Z87891 Personal history of nicotine dependence: Secondary | ICD-10-CM | POA: Insufficient documentation

## 2023-12-02 LAB — RAD ONC ARIA SESSION SUMMARY
Course Elapsed Days: 7
Plan Fractions Treated to Date: 5
Plan Prescribed Dose Per Fraction: 2 Gy
Plan Total Fractions Prescribed: 20
Plan Total Prescribed Dose: 40 Gy
Reference Point Dosage Given to Date: 10 Gy
Reference Point Session Dosage Given: 2 Gy
Session Number: 5

## 2023-12-02 LAB — CBC WITH DIFFERENTIAL (CANCER CENTER ONLY)
Abs Immature Granulocytes: 0.09 K/uL — ABNORMAL HIGH (ref 0.00–0.07)
Basophils Absolute: 0.1 K/uL (ref 0.0–0.1)
Basophils Relative: 1 %
Eosinophils Absolute: 0.1 K/uL (ref 0.0–0.5)
Eosinophils Relative: 1 %
HCT: 41.8 % (ref 36.0–46.0)
Hemoglobin: 14.4 g/dL (ref 12.0–15.0)
Immature Granulocytes: 1 %
Lymphocytes Relative: 10 %
Lymphs Abs: 0.9 K/uL (ref 0.7–4.0)
MCH: 31 pg (ref 26.0–34.0)
MCHC: 34.4 g/dL (ref 30.0–36.0)
MCV: 89.9 fL (ref 80.0–100.0)
Monocytes Absolute: 1.2 K/uL — ABNORMAL HIGH (ref 0.1–1.0)
Monocytes Relative: 12 %
Neutro Abs: 7.2 K/uL (ref 1.7–7.7)
Neutrophils Relative %: 75 %
Platelet Count: 289 K/uL (ref 150–400)
RBC: 4.65 MIL/uL (ref 3.87–5.11)
RDW: 12.5 % (ref 11.5–15.5)
WBC Count: 9.5 K/uL (ref 4.0–10.5)
nRBC: 0 % (ref 0.0–0.2)

## 2023-12-02 LAB — CMP (CANCER CENTER ONLY)
ALT: 31 U/L (ref 0–44)
AST: 28 U/L (ref 15–41)
Albumin: 4 g/dL (ref 3.5–5.0)
Alkaline Phosphatase: 54 U/L (ref 38–126)
Anion gap: 12 (ref 5–15)
BUN: 19 mg/dL (ref 8–23)
CO2: 31 mmol/L (ref 22–32)
Calcium: 9.8 mg/dL (ref 8.9–10.3)
Chloride: 80 mmol/L — ABNORMAL LOW (ref 98–111)
Creatinine: 1.11 mg/dL — ABNORMAL HIGH (ref 0.44–1.00)
GFR, Estimated: 51 mL/min — ABNORMAL LOW (ref >60)
Glucose, Bld: 111 mg/dL — ABNORMAL HIGH (ref 70–99)
Potassium: 3.6 mmol/L (ref 3.5–5.1)
Sodium: 123 mmol/L — ABNORMAL LOW (ref 135–145)
Total Bilirubin: 1 mg/dL (ref 0.0–1.2)
Total Protein: 7.1 g/dL (ref 6.5–8.1)

## 2023-12-02 NOTE — Telephone Encounter (Signed)
 Spoke to pt re: low sodium.  Stopped diuretics; not feeling dizzy.  Increase fluid intake/Gatorade.   Overall improved since radiation; started ribociclib  400 mg/day- on 9/03-follow-up as planned.  GB  FYI

## 2023-12-03 ENCOUNTER — Ambulatory Visit
Admission: RE | Admit: 2023-12-03 | Discharge: 2023-12-03 | Disposition: A | Discharge status: Home / Self Care | Source: Ambulatory Visit | Attending: Radiation Oncology | Admitting: Radiation Oncology

## 2023-12-03 ENCOUNTER — Other Ambulatory Visit: Payer: Self-pay

## 2023-12-03 DIAGNOSIS — Z510 Encounter for antineoplastic radiation therapy: Secondary | ICD-10-CM | POA: Diagnosis not present

## 2023-12-03 LAB — RAD ONC ARIA SESSION SUMMARY
Course Elapsed Days: 8
Plan Fractions Treated to Date: 6
Plan Prescribed Dose Per Fraction: 2 Gy
Plan Total Fractions Prescribed: 20
Plan Total Prescribed Dose: 40 Gy
Reference Point Dosage Given to Date: 12 Gy
Reference Point Session Dosage Given: 2 Gy
Session Number: 6

## 2023-12-06 ENCOUNTER — Other Ambulatory Visit: Payer: Self-pay

## 2023-12-06 ENCOUNTER — Ambulatory Visit
Admission: RE | Admit: 2023-12-06 | Discharge: 2023-12-06 | Disposition: A | Discharge status: Home / Self Care | Source: Ambulatory Visit | Attending: Radiation Oncology | Admitting: Radiation Oncology

## 2023-12-06 DIAGNOSIS — Z510 Encounter for antineoplastic radiation therapy: Secondary | ICD-10-CM | POA: Diagnosis not present

## 2023-12-06 LAB — RAD ONC ARIA SESSION SUMMARY
Course Elapsed Days: 11
Plan Fractions Treated to Date: 7
Plan Prescribed Dose Per Fraction: 2 Gy
Plan Total Fractions Prescribed: 20
Plan Total Prescribed Dose: 40 Gy
Reference Point Dosage Given to Date: 14 Gy
Reference Point Session Dosage Given: 2 Gy
Session Number: 7

## 2023-12-07 ENCOUNTER — Other Ambulatory Visit: Payer: Self-pay

## 2023-12-07 ENCOUNTER — Ambulatory Visit
Admission: RE | Admit: 2023-12-07 | Discharge: 2023-12-07 | Disposition: A | Discharge status: Home / Self Care | Source: Ambulatory Visit | Attending: Radiation Oncology | Admitting: Radiation Oncology

## 2023-12-07 ENCOUNTER — Other Ambulatory Visit: Payer: Self-pay | Admitting: *Deleted

## 2023-12-07 DIAGNOSIS — Z510 Encounter for antineoplastic radiation therapy: Secondary | ICD-10-CM | POA: Diagnosis not present

## 2023-12-07 DIAGNOSIS — I871 Compression of vein: Secondary | ICD-10-CM

## 2023-12-07 LAB — RAD ONC ARIA SESSION SUMMARY
Course Elapsed Days: 12
Plan Fractions Treated to Date: 8
Plan Prescribed Dose Per Fraction: 2 Gy
Plan Total Fractions Prescribed: 20
Plan Total Prescribed Dose: 40 Gy
Reference Point Dosage Given to Date: 16 Gy
Reference Point Session Dosage Given: 2 Gy
Session Number: 8

## 2023-12-08 ENCOUNTER — Ambulatory Visit
Admission: RE | Admit: 2023-12-08 | Discharge: 2023-12-08 | Disposition: A | Discharge status: Home / Self Care | Source: Ambulatory Visit | Attending: Radiation Oncology | Admitting: Radiation Oncology

## 2023-12-08 ENCOUNTER — Other Ambulatory Visit: Payer: Self-pay

## 2023-12-08 DIAGNOSIS — Z510 Encounter for antineoplastic radiation therapy: Secondary | ICD-10-CM | POA: Diagnosis not present

## 2023-12-08 LAB — RAD ONC ARIA SESSION SUMMARY
Course Elapsed Days: 13
Plan Fractions Treated to Date: 9
Plan Prescribed Dose Per Fraction: 2 Gy
Plan Total Fractions Prescribed: 20
Plan Total Prescribed Dose: 40 Gy
Reference Point Dosage Given to Date: 18 Gy
Reference Point Session Dosage Given: 2 Gy
Session Number: 9

## 2023-12-09 ENCOUNTER — Observation Stay
Admission: EM | Admit: 2023-12-09 | Discharge: 2023-12-11 | Disposition: A | Discharge status: Home / Self Care | Source: Ambulatory Visit | Attending: Obstetrics and Gynecology | Admitting: Obstetrics and Gynecology

## 2023-12-09 ENCOUNTER — Ambulatory Visit

## 2023-12-09 ENCOUNTER — Inpatient Hospital Stay

## 2023-12-09 ENCOUNTER — Other Ambulatory Visit: Payer: Self-pay

## 2023-12-09 ENCOUNTER — Telehealth: Payer: Self-pay | Admitting: Internal Medicine

## 2023-12-09 DIAGNOSIS — Z87891 Personal history of nicotine dependence: Secondary | ICD-10-CM | POA: Insufficient documentation

## 2023-12-09 DIAGNOSIS — K5900 Constipation, unspecified: Secondary | ICD-10-CM | POA: Diagnosis not present

## 2023-12-09 DIAGNOSIS — Z8616 Personal history of COVID-19: Secondary | ICD-10-CM | POA: Diagnosis not present

## 2023-12-09 DIAGNOSIS — E871 Hypo-osmolality and hyponatremia: Principal | ICD-10-CM | POA: Insufficient documentation

## 2023-12-09 DIAGNOSIS — Z170 Estrogen receptor positive status [ER+]: Secondary | ICD-10-CM | POA: Insufficient documentation

## 2023-12-09 DIAGNOSIS — N3000 Acute cystitis without hematuria: Secondary | ICD-10-CM

## 2023-12-09 DIAGNOSIS — N390 Urinary tract infection, site not specified: Secondary | ICD-10-CM | POA: Diagnosis not present

## 2023-12-09 DIAGNOSIS — I3139 Other pericardial effusion (noninflammatory): Secondary | ICD-10-CM | POA: Diagnosis not present

## 2023-12-09 DIAGNOSIS — I10 Essential (primary) hypertension: Secondary | ICD-10-CM | POA: Diagnosis present

## 2023-12-09 DIAGNOSIS — I871 Compression of vein: Secondary | ICD-10-CM | POA: Insufficient documentation

## 2023-12-09 DIAGNOSIS — K5649 Other impaction of intestine: Secondary | ICD-10-CM | POA: Diagnosis not present

## 2023-12-09 DIAGNOSIS — C50312 Malignant neoplasm of lower-inner quadrant of left female breast: Secondary | ICD-10-CM | POA: Diagnosis not present

## 2023-12-09 DIAGNOSIS — R339 Retention of urine, unspecified: Secondary | ICD-10-CM

## 2023-12-09 DIAGNOSIS — K5730 Diverticulosis of large intestine without perforation or abscess without bleeding: Secondary | ICD-10-CM | POA: Insufficient documentation

## 2023-12-09 DIAGNOSIS — K5641 Fecal impaction: Secondary | ICD-10-CM

## 2023-12-09 DIAGNOSIS — R112 Nausea with vomiting, unspecified: Secondary | ICD-10-CM | POA: Insufficient documentation

## 2023-12-09 LAB — CBC WITH DIFFERENTIAL/PLATELET
Abs Immature Granulocytes: 0.09 K/uL — ABNORMAL HIGH (ref 0.00–0.07)
Basophils Absolute: 0 K/uL (ref 0.0–0.1)
Basophils Relative: 0 %
Eosinophils Absolute: 0 K/uL (ref 0.0–0.5)
Eosinophils Relative: 0 %
HCT: 39.2 % (ref 36.0–46.0)
Hemoglobin: 13.1 g/dL (ref 12.0–15.0)
Immature Granulocytes: 1 %
Lymphocytes Relative: 4 %
Lymphs Abs: 0.3 K/uL — ABNORMAL LOW (ref 0.7–4.0)
MCH: 30.7 pg (ref 26.0–34.0)
MCHC: 33.4 g/dL (ref 30.0–36.0)
MCV: 91.8 fL (ref 80.0–100.0)
Monocytes Absolute: 0.2 K/uL (ref 0.1–1.0)
Monocytes Relative: 3 %
Neutro Abs: 6.2 K/uL (ref 1.7–7.7)
Neutrophils Relative %: 92 %
Platelets: 293 K/uL (ref 150–400)
RBC: 4.27 MIL/uL (ref 3.87–5.11)
RDW: 12.7 % (ref 11.5–15.5)
WBC: 6.8 K/uL (ref 4.0–10.5)
nRBC: 0 % (ref 0.0–0.2)

## 2023-12-09 LAB — URINALYSIS, ROUTINE W REFLEX MICROSCOPIC
Bilirubin Urine: NEGATIVE
Glucose, UA: NEGATIVE mg/dL
Hgb urine dipstick: NEGATIVE
Ketones, ur: 5 mg/dL — AB
Nitrite: NEGATIVE
Protein, ur: 100 mg/dL — AB
Specific Gravity, Urine: 1.019 (ref 1.005–1.030)
Squamous Epithelial / HPF: 0 /HPF (ref 0–5)
WBC, UA: >50 WBC/hpf (ref 0–5)
pH: 5 (ref 5.0–8.0)

## 2023-12-09 LAB — COMPREHENSIVE METABOLIC PANEL WITH GFR
ALT: 27 U/L (ref 0–44)
AST: 23 U/L (ref 15–41)
Albumin: 3.9 g/dL (ref 3.5–5.0)
Alkaline Phosphatase: 56 U/L (ref 38–126)
Anion gap: 12 (ref 5–15)
BUN: 22 mg/dL (ref 8–23)
CO2: 26 mmol/L (ref 22–32)
Calcium: 9.4 mg/dL (ref 8.9–10.3)
Chloride: 86 mmol/L — ABNORMAL LOW (ref 98–111)
Creatinine, Ser: 1.09 mg/dL — ABNORMAL HIGH (ref 0.44–1.00)
GFR, Estimated: 52 mL/min — ABNORMAL LOW (ref >60)
Glucose, Bld: 123 mg/dL — ABNORMAL HIGH (ref 70–99)
Potassium: 4.2 mmol/L (ref 3.5–5.1)
Sodium: 124 mmol/L — ABNORMAL LOW (ref 135–145)
Total Bilirubin: 1.6 mg/dL — ABNORMAL HIGH (ref 0.0–1.2)
Total Protein: 7.2 g/dL (ref 6.5–8.1)

## 2023-12-09 LAB — CBC
HCT: 37.1 % (ref 36.0–46.0)
Hemoglobin: 12.4 g/dL (ref 12.0–15.0)
MCH: 30.7 pg (ref 26.0–34.0)
MCHC: 33.4 g/dL (ref 30.0–36.0)
MCV: 91.8 fL (ref 80.0–100.0)
Platelets: 280 K/uL (ref 150–400)
RBC: 4.04 MIL/uL (ref 3.87–5.11)
RDW: 12.7 % (ref 11.5–15.5)
WBC: 6.9 K/uL (ref 4.0–10.5)
nRBC: 0 % (ref 0.0–0.2)

## 2023-12-09 LAB — CREATININE, SERUM
Creatinine, Ser: 0.85 mg/dL (ref 0.44–1.00)
GFR, Estimated: >60 mL/min (ref >60)

## 2023-12-09 MED ORDER — SODIUM CHLORIDE 0.9 % IV SOLN
INTRAVENOUS | Status: DC
Start: 2023-12-09 — End: 2023-12-10

## 2023-12-09 MED ORDER — TORSEMIDE 20 MG PO TABS: 20.0000 mg | ORAL_TABLET | Freq: Every day | ORAL | Status: DC

## 2023-12-09 MED ORDER — RIBOCICLIB SUCC (400 MG DOSE) 200 MG PO TBPK: 400.0000 mg | ORAL_TABLET | Freq: Every day | ORAL | Status: DC

## 2023-12-09 MED ORDER — LETROZOLE 2.5 MG PO TABS
2.5000 mg | ORAL_TABLET | Freq: Every day | ORAL | Status: DC
Start: 2023-12-09 — End: 2023-12-11
  Administered 2023-12-09 – 2023-12-11 (×3): 2.5 mg via ORAL
  Filled 2023-12-09 (×3): qty 1

## 2023-12-09 MED ORDER — SODIUM CHLORIDE 0.9 % IV SOLN
2.0000 g | Freq: Once | INTRAVENOUS | Status: AC
  Administered 2023-12-09: 2 g via INTRAVENOUS
  Filled 2023-12-09: qty 20

## 2023-12-09 MED ORDER — ACETAMINOPHEN 325 MG PO TABS: 650.0000 mg | ORAL_TABLET | Freq: Four times a day (QID) | ORAL | Status: DC | PRN

## 2023-12-09 MED ORDER — POLYETHYLENE GLYCOL 3350 17 G PO PACK
17.0000 g | PACK | Freq: Every day | ORAL | Status: DC | PRN
  Administered 2023-12-09: 17 g via ORAL
  Filled 2023-12-09: qty 1

## 2023-12-09 MED ORDER — INFLUENZA VAC SPLIT HIGH-DOSE 0.5 ML IM SUSY
0.5000 mL | PREFILLED_SYRINGE | INTRAMUSCULAR | Status: DC
  Filled 2023-12-09: qty 0.5

## 2023-12-09 MED ORDER — SODIUM CHLORIDE 0.9 % IV SOLN
2.0000 g | INTRAVENOUS | Status: DC
  Filled 2023-12-09: qty 20

## 2023-12-09 MED ORDER — ACETAMINOPHEN 650 MG RE SUPP: 650.0000 mg | Freq: Four times a day (QID) | RECTAL | Status: DC | PRN

## 2023-12-09 MED ORDER — SODIUM CHLORIDE 0.9 % IV BOLUS
500.0000 mL | Freq: Once | INTRAVENOUS | Status: AC
  Administered 2023-12-09: 500 mL via INTRAVENOUS

## 2023-12-09 MED ORDER — MINERAL OIL RE ENEM
1.0000 | ENEMA | Freq: Once | RECTAL | Status: AC
  Administered 2023-12-09: 1 via RECTAL

## 2023-12-09 MED ORDER — METOPROLOL TARTRATE 25 MG PO TABS
25.0000 mg | ORAL_TABLET | Freq: Two times a day (BID) | ORAL | Status: DC
  Administered 2023-12-09 – 2023-12-11 (×4): 25 mg via ORAL
  Filled 2023-12-09 (×5): qty 1

## 2023-12-09 MED ORDER — ONDANSETRON HCL 4 MG/2ML IJ SOLN
4.0000 mg | Freq: Four times a day (QID) | INTRAMUSCULAR | Status: DC | PRN
  Administered 2023-12-09: 4 mg via INTRAVENOUS
  Filled 2023-12-09: qty 2

## 2023-12-09 MED ORDER — ENOXAPARIN SODIUM 40 MG/0.4ML IJ SOSY
40.0000 mg | PREFILLED_SYRINGE | INTRAMUSCULAR | Status: DC
  Filled 2023-12-09 (×2): qty 0.4

## 2023-12-09 MED ORDER — ONDANSETRON HCL 4 MG PO TABS: 4.0000 mg | ORAL_TABLET | Freq: Four times a day (QID) | ORAL | Status: DC | PRN

## 2023-12-09 NOTE — ED Notes (Signed)
 Pt sitting up in bed, pt vomiting, nausea med given.

## 2023-12-09 NOTE — Telephone Encounter (Signed)
 Please re-schedule her to next Friday- chemo slot is ok- if needed GB

## 2023-12-09 NOTE — H&P (Signed)
 History and Physical    Dana Snow FMW:[id number] DOB: 1945-05-05 DOA: 12/09/2023  DOS: the patient was seen and examined on 12/09/2023  PCP: Marikay Eva POUR, PA   Patient coming from: Home  I have personally briefly reviewed patient's old medical records in [facility]  Chief Complaint: Urinary retention  HPI: Dana Snow is a pleasant 78 y.o. female with medical history significant for metastatic breast cancer undergoing chemotherapy/radiation, HTN who came into ED complaining of urinary retention.  Patient is stated that last she was able to urinate was 4 PM yesterday.  She denies pain or burning with urination.  Patient is alert awake and oriented.  She denied any fever chills nausea vomiting.  ED Course: Upon arrival to the ED, patient is found to be tachycardic around 113 blood pressure was 131/77 afebrile at 98.3, sodium level of 124, urine was positive for urinary tract infection.  Her postvoid residual was more than 500 cc.  Antibiotic was given,  Foley was placed and hospitalist service was consulted for evaluation for admission.  Review of Systems:  ROS  All other systems negative except as noted in the HPI.  Past Medical History:  Diagnosis Date   Breast cancer (HCC) 2015   left lumpectomy 04/05/2014 and reexcision 04/27/2014 - radiation treatment   History of COVID-19    Hypertension    Personal history of radiation therapy 2016   Left breast    Past Surgical History:  Procedure Laterality Date   ABDOMINAL HYSTERECTOMY     BREAST BIOPSY Left 03/15/2014   8:00 us  bx, SCLEROSING ADENOSIS AND COLUMNAR CELL CHANGE    BREAST BIOPSY Left 02/27/2014   invasive lobular carcinoma LIQ   BREAST LUMPECTOMY Left 04/03/2014   [facility], DCIS, ALH, negative LN   BREAST LUMPECTOMY Left 04/27/2014   re-excision for clear margins   COMBINED HYSTEROSCOPY DIAGNOSTIC / D&C     PARTIAL MASTECTOMY WITH AXILLARY SENTINEL LYMPH NODE BIOPSY Left      reports that she has  quit smoking. Her smoking use included cigarettes. She has never used smokeless tobacco. She reports current alcohol use of about 2.0 standard drinks of alcohol per week. She reports that she does not use drugs.  Allergies  Allergen Reactions   Acetaminophen     Codeine    Codone [Hydrocodone] Other (See Comments)    hyperactive   Oxycodone Hcl    Percocet [Oxycodone-Acetaminophen ] Other (See Comments)    lupy   Penicillins Rash    Family History  Problem Relation Age of Onset   Breast cancer Neg Hx     Prior to Admission medications   Medication Sig Start Date End Date Taking? Authorizing Provider  Calcium-Vitamin D  600-200 MG-UNIT per tablet Take 1 tablet by mouth daily.   Yes [provider]  letrozole  (FEMARA ) 2.5 MG tablet Take 1 tablet (2.5 mg total) by mouth daily. 11/26/23  Yes Brahmanday, Govinda R, MD  metoprolol  tartrate (LOPRESSOR ) 25 MG tablet Take 25 mg by mouth 2 (two) times daily. 11/19/23 11/18/24 Yes [provider]  ondansetron  (ZOFRAN ) 8 MG tablet One pill every 8 hours as needed for nausea/vomitting. 11/26/23  Yes Brahmanday, Govinda R, MD  potassium chloride (KLOR-CON) 10 MEQ tablet Take 10 mEq by mouth once. 11/01/23 10/31/24 Yes [provider]  ribociclib  succ (KISQALI  400MG  DAILY DOSE) 200 MG Therapy Pack Take 2 tablets (400 mg total) by mouth daily. Take for 21 days on, 7 days off, repeat every 28 days. 11/26/23  Yes Brahmanday,  Govinda R, MD  torsemide  (DEMADEX ) 20 MG tablet Take 20 mg by mouth in the morning, at noon, in the evening, and at bedtime. 11/19/23 11/18/24 Yes [provider]  vitamin C (ASCORBIC ACID) 500 MG tablet Take 500 mg by mouth daily.   Yes [provider]  diltiazem (CARDIZEM CD) 120 MG 24 hr capsule Take 120 mg by mouth daily. Patient not taking: Reported on 12/09/2023 11/19/23 11/18/24  [provider]    Physical Exam: Vitals:   12/09/23 0832 12/09/23 0835  BP: 131/77   Pulse: (!) 113    Resp: 18   Temp: 98.3 F (36.8 C)   TempSrc: Oral   SpO2: 93%   Weight:  60.3 kg  Height:  5' 2 (1.575 m)    Physical Exam   Constitutional: Alert, awake, calm, comfortable HEENT: Neck supple Respiratory: Clear to auscultation B/L, no wheezing, no rales.  Cardiovascular: Regular rate and rhythm, no murmurs / rubs / gallops. No extremity edema. 2+ pedal pulses. No carotid bruits.  Abdomen: Soft, no tenderness, Bowel sounds positive.  Musculoskeletal: no clubbing / cyanosis. Good ROM, no contractures. Normal muscle tone.  Skin: no rashes, lesions, ulcers. Neurologic: CN 2-12 grossly intact. Sensation intact, No focal deficit identified Psychiatric: Alert and oriented x 3. Normal mood.    Labs on Admission: I have personally reviewed following labs and imaging studies  CBC: Recent Labs  Lab 12/09/23 0946  WBC 6.8  NEUTROABS 6.2  HGB 13.1  HCT 39.2  MCV 91.8  PLT 293   Basic Metabolic Panel: Recent Labs  Lab 12/09/23 0946  NA 124*  K 4.2  CL 86*  CO2 26  GLUCOSE 123*  BUN 22  CREATININE 1.09*  CALCIUM 9.4   GFR: Estimated Creatinine Clearance: 37 mL/min (A) (by C-G formula based on SCr of 1.09 mg/dL (H)). Liver Function Tests: Recent Labs  Lab 12/09/23 0946  AST 23  ALT 27  ALKPHOS 56  BILITOT 1.6*  PROT 7.2  ALBUMIN 3.9   No results for input(s): LIPASE, AMYLASE in the last 168 hours. No results for input(s): AMMONIA in the last 168 hours. Coagulation Profile: No results for input(s): INR, PROTIME in the last 168 hours. Cardiac Enzymes: No results for input(s): CKTOTAL, CKMB, CKMBINDEX, TROPONINI, TROPONINIHS in the last 168 hours. BNP (last 3 results) Recent Labs    08/15/23 1343 08/15/23 1635  BNP 42.8 41.5   HbA1C: No results for input(s): HGBA1C in the last 72 hours. CBG: No results for input(s): GLUCAP in the last 168 hours. Lipid Profile: No results for input(s): CHOL, HDL, LDLCALC, TRIG, CHOLHDL,  LDLDIRECT in the last 72 hours. Thyroid  Function Tests: No results for input(s): TSH, T4TOTAL, FREET4, T3FREE, THYROIDAB in the last 72 hours. Anemia Panel: No results for input(s): VITAMINB12, FOLATE, FERRITIN, TIBC, IRON, RETICCTPCT in the last 72 hours. Urine analysis:    Component Value Date/Time   COLORURINE YELLOW (A) 12/09/2023 0945   APPEARANCEUR TURBID (A) 12/09/2023 0945   LABSPEC 1.019 12/09/2023 0945   PHURINE 5.0 12/09/2023 0945   GLUCOSEU NEGATIVE 12/09/2023 0945   HGBUR NEGATIVE 12/09/2023 0945   BILIRUBINUR NEGATIVE 12/09/2023 0945   KETONESUR 5 (A) 12/09/2023 0945   PROTEINUR 100 (A) 12/09/2023 0945   NITRITE NEGATIVE 12/09/2023 0945   LEUKOCYTESUR LARGE (A) 12/09/2023 0945    Radiological Exams on Admission: I have personally reviewed images No results found.  EKG: N/A    Assessment/Plan Principal Problem:   UTI (urinary tract infection)  Assessment and Plan: 78 year old female double/PMH of breast cancer on chemo and radiation, HTN who came into ED complaining of urinary retention.  She had a postvoid of 500 cc and Foley catheter was placed.  Urine analysis positive for urine analysis and hospitalist service was consulted for evaluation for admission.  1.  Urinary retention/positive urine analysis - She will be placed in observation. - Foley catheter was placed - Antibiotics ceftriaxone  will be given and continued. - Will follow the cultures. - Pain medications  2.  HTN - Will continue monitor blood pressure - Resume her home blood pressure medications metoprolol  - Hold torsemide  due to possible dehydration - Continue to monitor blood pressure  3.  Metastatic breast cancer - Follow-up as outpatient  4.  Hyponatremia - This may be related to dehydration and urinary retention - Will give her normal saline - Monitor sodium level      DVT prophylaxis: Lovenox  Code Status: Full Code Family Communication:  None Disposition Plan: Home Consults called: None Admission status: Observation, Med-Surg   Nena Rebel, MD [facility] 12/09/2023, 12:32 PM

## 2023-12-09 NOTE — ED Provider Notes (Signed)
 [facility] Provider Note    Event Date/Time   First MD Initiated Contact with Patient 12/09/23 [phone]     (approximate)   History   Urinary Retention   HPI  Dana Snow is a 78 y.o. female with history of breast cancer who comes in with concerns for urinary retention.  Patient was last able to urinate yesterday at 4:00 denies any pain or burning with urination.  Patient is currently on radiation and chemo for breast cancer.  According to the husband patient is been unable to urinate since 4 PM yesterday she has then developed some suprapubic tenderness.  She does report getting daily radiation to her chest as well as chemotherapy pills at nighttime.  He denies any prior issues with urination in the past.  Patient reports feeling little pressure in her suprapubic area.  She reports that she has tried to urinate but unable to get much out.  She reports that she has had some liquid stools and she thinks that she just got a UTI secondary to them.  She reports having UTIs in the past   Physical Exam   Triage Vital Signs: ED Triage Vitals  Encounter Vitals Group     BP 12/09/23 0832 131/77     Girls Systolic BP Percentile --      Girls Diastolic BP Percentile --      Boys Systolic BP Percentile --      Boys Diastolic BP Percentile --      Pulse Rate 12/09/23 0832 (!) 113     Resp 12/09/23 0832 18     Temp 12/09/23 0832 98.3 F (36.8 C)     Temp Source 12/09/23 0832 Oral     SpO2 12/09/23 0832 93 %     Weight 12/09/23 0835 133 lb (60.3 kg)     Height 12/09/23 0835 5' 2 (1.575 m)     Head Circumference --      Peak Flow --      Pain Score 12/09/23 0833 7     Pain Loc --      Pain Education --      Exclude from Growth Chart --     Most recent vital signs: Vitals:   12/09/23 0832  BP: 131/77  Pulse: (!) 113  Resp: 18  Temp: 98.3 F (36.8 C)  SpO2: 93%     General: Awake, no distress.  CV:  Good peripheral perfusion.  Resp:  Normal  effort.  Abd:  No distention.  Suprapubic tenderness and fullness noted Other:  Equal strength in legs.  Sensation intact bilaterally Able to plantarflex and dorsiflex her ankles. Patient has large stool ball noted in the rectum seems to have intact rectal tone when she attempts to squeeze down but rectum is a little bit distended secondary to large stool ball she does have sensation intact in the rectal area   ED Results / Procedures / Treatments   Labs (all labs ordered are listed, but only abnormal results are displayed) Labs Reviewed  CBC WITH DIFFERENTIAL/PLATELET - Abnormal; Notable for the following components:      Result Value   Lymphs Abs 0.3 (*)    Abs Immature Granulocytes 0.09 (*)    All other components within normal limits  COMPREHENSIVE METABOLIC PANEL WITH GFR - Abnormal; Notable for the following components:   Sodium 124 (*)    Chloride 86 (*)    Glucose, Bld 123 (*)    Creatinine, Ser 1.09 (*)  Total Bilirubin 1.6 (*)    GFR, Estimated 52 (*)    All other components within normal limits  URINALYSIS, ROUTINE W REFLEX MICROSCOPIC - Abnormal; Notable for the following components:   Color, Urine YELLOW (*)    APPearance TURBID (*)    Ketones, ur 5 (*)    Protein, ur 100 (*)    Leukocytes,Ua LARGE (*)    Bacteria, UA MANY (*)    All other components within normal limits  URINE CULTURE     RADIOLOGY Patient declined   PROCEDURES:  Critical Care performed: No  Procedures   MEDICATIONS ORDERED IN ED: Medications  mineral oil enema 1 enema (has no administration in time range)  sodium chloride  0.9 % bolus 500 mL (has no administration in time range)     IMPRESSION / MDM / ASSESSMENT AND PLAN / ED COURSE  I reviewed the triage vital signs and the nursing notes.   Patient's presentation is most consistent with acute presentation with potential threat to life or bodily function.   Patient comes in with concern for new urinary retention.  Postvoid  bladder scan was greater than 500 on examination patient's seems consistent with retention.  Foley catheter was placed.  I went to discussed with patient the possibility of this being caused by metastatic disease to her spine given she has got known thoracic lesions when I reviewed a PET scan but patient is adamant that she thinks is just from a UTI.  It is reassuring that she has no sensation changes in her legs or any weakness in her legs.  We will get labs to evaluate for Electra abnormalities, AKI.   I did do a rectal exam on her she reports having stool just leaking out of her and she had a large stool ball noted.  I attempted to remove some of the stool to help break it up and recommended doing an enema.  Patient's labs were notable for a low sodium.  It appears that has been kind of downtrending where it was 133 weeks ago 123 7 days ago and now up to 124.  Given I do suspect some of this is related to dehydration given she is undergoing radiation and I will give her a little bit of IV fluids.  She is asymptomatic from it and really does not want to stay in the emergency room or be admitted to the hospital.  We had discussed MRI to evaluate for thoracic or lumbar metastatic cancer that could be causing cord compression.  Patient declines and does not want to have the MRI done.  We discussed that if there was a lesion on her spine that was causing urinary retention that it is important to diagnose this sooner rather than later as she would need potential surgery, radiation, steroids.  It can also lead to lifelong issues with retention, paralysis.  She expressed understanding but wanted to hold off on MRI at this time.  I do suspect that given her stool ball and the UTI that her symptoms are probably more likely from this but I had explained to her that she had a PET scan that did show some lesions on her spine and that I would like to make sure that is not related to that by doing the MRI.  Patient  understands and is able to repeat back these concerns but again declines MRI today.  She does have an appointment tomorrow with Dr. KATHEE.  I did call Dr. KATHEE and update him  on the above and he expressed understanding I will follow-up with patient in the office.  Patient was able to have a large bowel movement.  Although patient has a UTI she denies any symptoms suggest pyelonephritis and that does not meet sepsis criteria.  Discussed with patient her low sodium level.  We discussed admission to the hospital given how low it is and that if she continues to have bowel movements with enemas that could drop even lower.  She does have some weakness associated with it.  Her urine is concerning for UTI she has got significant constipation.  Patient was agreeable to staying overnight in the hospital I did let Dr. B know that patient was going to be admitted.  I did discuss the MRIs again of her spine but she would like to decline and wanted to be treated first for the constipation and UTI.       FINAL CLINICAL IMPRESSION(S) / ED DIAGNOSES   Final diagnoses:  Hyponatremia  Fecal impaction in rectum [facility])  Urinary retention  Acute cystitis without hematuria     Rx / DC Orders   ED Discharge Orders     None        Note:  This document was prepared using Dragon voice recognition software and may include unintentional dictation errors.   Ernest Ronal BRAVO, MD 12/09/23 1250

## 2023-12-09 NOTE — ED Notes (Signed)
 Pt in bed, pt denies pain and nausea, pt states that she is ready to go upstairs, pt has no requests at this time.

## 2023-12-09 NOTE — ED Triage Notes (Addendum)
 Pt to ED for c/o urinary retention. Pt states last able to urinate at 4:00 yesterday. Denies pain or burning with urination. Pt A&O  Pt currently receiving radiation and taking chemo pills for breast cancer.

## 2023-12-09 NOTE — Progress Notes (Signed)
 Prior-To-Admission Oral Chemotherapy for Treatment of Oncologic Disease   Order noted from Dr. Paudel to continue prior-to-admission oral chemotherapy regimen of RIBOCICLIB .  Procedure Per Pharmacy & Therapeutics Committee Policy: Orders for continuation of home oral chemotherapy for treatment of an oncologic disease will be held unless approved by an oncologist during current admission.    For patients receiving oncology care at [facility], inpatient pharmacist contacts patient's oncologist during regular office hours to review. If earlier review is medically necessary, attending physician consults [facility] on-call oncologist   [facility] oncologist Dr Rennie was  notified by inpatient pharmacy and pharmacy was instructed to HOLD Ribociclib  during admission.    Dana Snow, PharmD, BCPS Clinical Pharmacist 12/09/2023 12:39 PM

## 2023-12-10 ENCOUNTER — Ambulatory Visit

## 2023-12-10 ENCOUNTER — Inpatient Hospital Stay: Admitting: Internal Medicine

## 2023-12-10 ENCOUNTER — Inpatient Hospital Stay

## 2023-12-10 ENCOUNTER — Observation Stay

## 2023-12-10 ENCOUNTER — Other Ambulatory Visit: Payer: Self-pay | Admitting: *Deleted

## 2023-12-10 DIAGNOSIS — I871 Compression of vein: Secondary | ICD-10-CM

## 2023-12-10 DIAGNOSIS — N3000 Acute cystitis without hematuria: Secondary | ICD-10-CM | POA: Diagnosis not present

## 2023-12-10 DIAGNOSIS — Z170 Estrogen receptor positive status [ER+]: Secondary | ICD-10-CM

## 2023-12-10 DIAGNOSIS — I3139 Other pericardial effusion (noninflammatory): Secondary | ICD-10-CM | POA: Insufficient documentation

## 2023-12-10 LAB — PROTIME-INR
INR: 1.1 (ref 0.8–1.2)
Prothrombin Time: 14.5 s (ref 11.4–15.2)

## 2023-12-10 LAB — OSMOLALITY: Osmolality: 265 mosm/kg — ABNORMAL LOW (ref 275–295)

## 2023-12-10 LAB — COMPREHENSIVE METABOLIC PANEL WITH GFR
ALT: 27 U/L (ref 0–44)
AST: 25 U/L (ref 15–41)
Albumin: 3.4 g/dL — ABNORMAL LOW (ref 3.5–5.0)
Alkaline Phosphatase: 46 U/L (ref 38–126)
Anion gap: 7 (ref 5–15)
BUN: 14 mg/dL (ref 8–23)
CO2: 24 mmol/L (ref 22–32)
Calcium: 9 mg/dL (ref 8.9–10.3)
Chloride: 96 mmol/L — ABNORMAL LOW (ref 98–111)
Creatinine, Ser: 0.74 mg/dL (ref 0.44–1.00)
GFR, Estimated: >60 mL/min (ref >60)
Glucose, Bld: 105 mg/dL — ABNORMAL HIGH (ref 70–99)
Potassium: 4.4 mmol/L (ref 3.5–5.1)
Sodium: 127 mmol/L — ABNORMAL LOW (ref 135–145)
Total Bilirubin: 1.1 mg/dL (ref 0.0–1.2)
Total Protein: 6.4 g/dL — ABNORMAL LOW (ref 6.5–8.1)

## 2023-12-10 LAB — TSH: TSH: 0.812 u[IU]/mL (ref 0.350–4.500)

## 2023-12-10 LAB — CBC
HCT: 34.7 % — ABNORMAL LOW (ref 36.0–46.0)
Hemoglobin: 12.2 g/dL (ref 12.0–15.0)
MCH: 31.9 pg (ref 26.0–34.0)
MCHC: 35.2 g/dL (ref 30.0–36.0)
MCV: 90.8 fL (ref 80.0–100.0)
Platelets: 264 K/uL (ref 150–400)
RBC: 3.82 MIL/uL — ABNORMAL LOW (ref 3.87–5.11)
RDW: 13 % (ref 11.5–15.5)
WBC: 5.2 K/uL (ref 4.0–10.5)
nRBC: 0 % (ref 0.0–0.2)

## 2023-12-10 LAB — OSMOLALITY, URINE: Osmolality, Ur: 526 mosm/kg (ref 300–900)

## 2023-12-10 LAB — SODIUM, URINE, RANDOM: Sodium, Ur: 39 mmol/L

## 2023-12-10 MED ORDER — LACTULOSE 10 GM/15ML PO SOLN
30.0000 g | Freq: Once | ORAL | Status: AC
  Administered 2023-12-10: 30 g via ORAL
  Filled 2023-12-10: qty 60

## 2023-12-10 MED ORDER — SODIUM CHLORIDE 0.9 % IV SOLN
1.0000 g | INTRAVENOUS | Status: DC
  Filled 2023-12-10: qty 10

## 2023-12-10 MED ORDER — SODIUM CHLORIDE 0.9 % IV SOLN
2.0000 g | Freq: Four times a day (QID) | INTRAVENOUS | Status: DC
  Administered 2023-12-10 – 2023-12-11 (×4): 2 g via INTRAVENOUS
  Filled 2023-12-10 (×6): qty 2000

## 2023-12-10 MED ORDER — LACTULOSE 10 GM/15ML PO SOLN: 30.0000 g | ORAL | Status: DC

## 2023-12-10 MED ORDER — SODIUM CHLORIDE 0.9 % IV SOLN: INTRAVENOUS | Status: AC

## 2023-12-10 MED ORDER — IOHEXOL 300 MG/ML  SOLN
100.0000 mL | Freq: Once | INTRAMUSCULAR | Status: AC | PRN
  Administered 2023-12-10: 100 mL via INTRAVENOUS

## 2023-12-10 NOTE — Plan of Care (Signed)

## 2023-12-10 NOTE — Progress Notes (Signed)
 NA

## 2023-12-10 NOTE — Progress Notes (Signed)
 I administered soap sud enema and patient tolerated well, she had loose bowels afterwards , but noted she was still impacted during peri care,she tried pushing it out but couldn't,   so I gave miralax  PRN  to see if it will help. Will continue to monitor

## 2023-12-10 NOTE — Progress Notes (Signed)
 PROGRESS NOTE    Dana Snow  FMW:[id number] DOB: [birth year] DOA: 12/09/2023 PCP: Marikay Eva POUR, PA  Outpatient Specialists: oncology    Brief Narrative:   Dana Snow is a pleasant 78 y.o. female with medical history significant for metastatic breast cancer undergoing chemotherapy/radiation, HTN who came into ED complaining of urinary retention.  Patient is stated that last she was able to urinate was 4 PM yesterday.  She denies pain or burning with urination.  Patient is alert awake and oriented.  She denied any fever chills nausea vomiting.    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 Have removed the foley placed in the ER. CT shows no obstruction - monitor, I/o cath prn  # Acute cystitis Symptomatic, no signs pyelo on CT but does show bladder inflammation, culture growing e vaecalis - ceftriaxone  switched to amp - f/u sensitivities  # Constipatioin With rectal stool ball, disempacted in ER on 9/11 and again today at bedside. Lactulose  ordered, had enema yesterday. Reports large BM this afternoon after disempaction. CT shows signs stercoral colitis - bowel regimen at discharge  # Nausea/vomiting Yesterday, resolved but appetite poor. Nbnb. Ct no etiology, maybe 2/2 malignancy and meds, maybe 2/2 constipation - monitor for now  # Hyponatremia Chronic, baseline is low to mid 30s here 127. Studies suggestive of dehydration. Is on torsemide  at home this was started in August, could explain this. - fluids overnight, repeat tomorrow. 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 - outpt onc/rads f/u - home letrozole    DVT prophylaxis: lovenox  Code Status: full Family Communication: family at bedside  Level of care: Med-Surg Status is: Observation    Consultants:  none  Procedures: none  Antimicrobials:  See above    Subjective: Reports feeling   Objective: Vitals:   12/10/23 0143 12/10/23 0402 12/10/23 0907 12/10/23 1015  BP: (!) 129/59 135/64 114/62   Pulse: 86 68 95   Resp: 16 16 19    Temp: 97.9 F (36.6 C) 97.6 F (36.4 C) 97.6 F (36.4 C)   TempSrc:   Oral   SpO2: 91% 100% (!) 84% 94%  Weight:      Height:        Intake/Output Summary (Last 24 hours) at 12/10/2023 1545 Last data filed at 12/10/2023 1120 Gross per 24 hour  Intake 1937.48 ml  Output 1201 ml  Net 736.48 ml   Filed Weights   12/09/23 0835  Weight: 60.3 kg    Examination:  General exam: Appears calm and comfortable  Respiratory system: Clear to auscultation. Respiratory effort normal. Cardiovascular system: S1 & S2 heard, RRR.   Gastrointestinal system: Abdomen is nondistended, soft and nontender.   Central nervous system: Alert and oriented. No focal neurological deficits. Extremities: Symmetric 5 x 5 power. Skin: No rashes, lesions or ulcers Psychiatry: Judgement and insight appear normal. Mood & affect appropriate.     Data Reviewed: I have personally reviewed following labs and imaging studies  CBC: Recent Labs  Lab 12/09/23 0946 12/09/23 1354 12/10/23 0520  WBC 6.8 6.9 5.2  NEUTROABS 6.2  --   --  HGB 13.1 12.4 12.2  HCT 39.2 37.1 34.7*  MCV 91.8 91.8 90.8  PLT 293 280 264   Basic Metabolic Panel: Recent Labs  Lab 12/09/23 0946 12/09/23 1354 12/10/23 0520  NA 124*  --  127*  K 4.2  --  4.4  CL 86*  --  96*  CO2 26  --  24  GLUCOSE 123*  --  105*  BUN 22  --  14  CREATININE 1.09* 0.85 0.74  CALCIUM 9.4  --  9.0   GFR: Estimated Creatinine Clearance: 50.4 mL/min (by C-G formula based on SCr of 0.74 mg/dL). Liver  Function Tests: Recent Labs  Lab 12/09/23 0946 12/10/23 0520  AST 23 25  ALT 27 27  ALKPHOS 56 46  BILITOT 1.6* 1.1  PROT 7.2 6.4*  ALBUMIN 3.9 3.4*   No results for input(s): LIPASE, AMYLASE in the last 168 hours. No results for input(s): AMMONIA in the last 168 hours. Coagulation Profile: Recent Labs  Lab 12/10/23 0520  INR 1.1   Cardiac Enzymes: No results for input(s): CKTOTAL, CKMB, CKMBINDEX, TROPONINI in the last 168 hours. BNP (last 3 results) No results for input(s): PROBNP in the last 8760 hours. HbA1C: No results for input(s): HGBA1C in the last 72 hours. CBG: No results for input(s): GLUCAP in the last 168 hours. Lipid Profile: No results for input(s): CHOL, HDL, LDLCALC, TRIG, CHOLHDL, LDLDIRECT in the last 72 hours. Thyroid  Function Tests: No results for input(s): TSH, T4TOTAL, FREET4, T3FREE, THYROIDAB in the last 72 hours. Anemia Panel: No results for input(s): VITAMINB12, FOLATE, FERRITIN, TIBC, IRON, RETICCTPCT in the last 72 hours. Urine analysis:    Component Value Date/Time   COLORURINE YELLOW (A) 12/09/2023 0945   APPEARANCEUR TURBID (A) 12/09/2023 0945   LABSPEC 1.019 12/09/2023 0945   PHURINE 5.0 12/09/2023 0945   GLUCOSEU NEGATIVE 12/09/2023 0945   HGBUR NEGATIVE 12/09/2023 0945   BILIRUBINUR NEGATIVE 12/09/2023 0945   KETONESUR 5 (A) 12/09/2023 0945   PROTEINUR 100 (A) 12/09/2023 0945   NITRITE NEGATIVE 12/09/2023 0945   LEUKOCYTESUR LARGE (A) 12/09/2023 0945   Sepsis Labs: @LABRCNTIP (procalcitonin:4,lacticidven:4)  ) Recent Results (from the past 240 hours)  Urine Culture     Status: Abnormal (Preliminary result)   Collection Time: 12/09/23  9:46 AM   Specimen: Urine, Clean Catch  Result Value Ref Range Status   Specimen Description   Final    URINE, CLEAN CATCH Performed at [facility], [address], [place], [place] [address]    Special Requests   Final     NONE Performed at [facility], [address] [place], [place] [address]    Culture (A)  Final    >=100,000 COLONIES/mL ENTEROCOCCUS FAECALIS SUSCEPTIBILITIES TO FOLLOW Performed at [facility], [address] [place] [address], [place], [place] [address]    Report Status PENDING  Incomplete         Radiology Studies: CT ABDOMEN PELVIS W CONTRAST Result Date: 12/10/2023 EXAM: CT ABDOMEN AND PELVIS WITH CONTRAST 12/10/2023 11:34:43 AM TECHNIQUE: CT of the abdomen and pelvis was performed with the administration of intravenous contrast. Multiplanar reformatted images are provided for review. Automated exposure control, iterative reconstruction, and/or weight-based adjustment of the mA/kV was utilized to reduce the radiation dose to as low as reasonably achievable. COMPARISON: None available. CLINICAL HISTORY: Urinary retention, metastatic breast cancer. FINDINGS: LOWER CHEST: Small bilateral pleural effusions are seen at the visualized lung bases, right greater than left. Progressive collapse and consolidation within the visualized  right middle lobe. Small pericardial effusion. There is extensive coronary artery calcification, largely within the left anterior descending coronary artery. Cardiac size is within normal limits. LIVER: Mild hepatic steatosis. No focal intrahepatic mass identified GALLBLADDER AND BILE DUCTS: Gallbladder is unremarkable. No biliary ductal dilatation. SPLEEN: No acute abnormality. PANCREAS: No acute abnormality. ADRENAL GLANDS: No acute abnormality. KIDNEYS, URETERS AND BLADDER: The bladder is decompressed; however, there is circumferential bladder wall thickening and perivesicular inflammatory stranding, which may reflect superimposed infectious or inflammatory cystitis. No perivesicular fluid collections are identified. GI AND BOWEL: There is moderate sigmoid diverticulosis. There is moderate stool within the rectal vault with superimposed rectal wall thickening and  perirectal inflammatory stranding and mild presacral edema, which may reflect changes of a superimposed proctitis, possibly circle proctitis. There is no evidence of obstruction. PERITONEUM AND RETROPERITONEUM: No ascites. No free air. VASCULATURE: Aorta is normal in caliber. Mild aortoiliac atherosclerotic calcification. LYMPH NODES: No lymphadenopathy. REPRODUCTIVE ORGANS: Status post hysterectomy. No adnexal masses. BONES AND SOFT TISSUES: The osseous structures are age appropriate. No focal lytic or blastic bone lesion. IMPRESSION: 1. Circumferential bladder wall thickening and perivesicular inflammatory stranding, possibly reflecting superimposed infectious or inflammatory cystitis. No perivesicular fluid collections identified. 2. Moderate sigmoid diverticulosis without evidence of diverticulitis. 3. Rectal wall thickening with perirectal inflammatory stranding and mild presacral edema, possibly reflecting superimposed proctitis. No evidence of obstruction or perforation. 4. No evidence of metastatic disease within abdomen and pelvis Electronically signed by: Dorethia Molt MD 12/10/2023 11:57 AM EDT RP Workstation: [id number]        Scheduled Meds:  enoxaparin  (LOVENOX ) injection  40 mg Subcutaneous Q24H   Influenza vac split trivalent PF  0.5 mL Intramuscular Tomorrow-1000   letrozole   2.5 mg Oral Daily   metoprolol  tartrate  25 mg Oral BID   Continuous Infusions:  sodium chloride  100 mL/hr at 12/10/23 1246   ampicillin  (OMNIPEN) IV 2 g (12/10/23 1325)     LOS: 0 days     Devaughn KATHEE Ban, MD [facility]   If 7PM-7AM, please contact night-coverage [email or web address] Password [email or web address] 12/10/2023, 3:45 PM

## 2023-12-10 NOTE — Progress Notes (Signed)
 Patient currently admitted to the hospital for hyponatremia UTI.  Okay to hold Ribociclib ; but continue letrozole .  Patient will follow-up with me in the clinic next week.  Also discussed with the patient's daughter Vertell by the bedside. GB

## 2023-12-10 NOTE — Progress Notes (Signed)
 Urinary foley catheter discontinued per Dr Dow ordered.  cloudy amber urine emptied from drainage bag.

## 2023-12-10 NOTE — Care Management Obs Status (Signed)
 MEDICARE OBSERVATION STATUS NOTIFICATION   Patient Details  Name: Dana Snow MRN: [id number] Date of Birth: January 24, 1946   Medicare Observation Status Notification Given:  Yes    Lalani Winkles W, CMA 12/10/2023, 10:34 AM

## 2023-12-11 ENCOUNTER — Encounter: Payer: Self-pay | Admitting: Oncology

## 2023-12-11 ENCOUNTER — Other Ambulatory Visit: Payer: Self-pay | Admitting: Oncology

## 2023-12-11 DIAGNOSIS — N3000 Acute cystitis without hematuria: Secondary | ICD-10-CM | POA: Diagnosis not present

## 2023-12-11 LAB — SODIUM, URINE, RANDOM: Sodium, Ur: <30 mmol/L

## 2023-12-11 LAB — BASIC METABOLIC PANEL WITH GFR
Anion gap: 5 (ref 5–15)
BUN: 11 mg/dL (ref 8–23)
CO2: 25 mmol/L (ref 22–32)
Calcium: 9.2 mg/dL (ref 8.9–10.3)
Chloride: 98 mmol/L (ref 98–111)
Creatinine, Ser: 0.93 mg/dL (ref 0.44–1.00)
GFR, Estimated: >60 mL/min (ref >60)
Glucose, Bld: 109 mg/dL — ABNORMAL HIGH (ref 70–99)
Potassium: 5.1 mmol/L (ref 3.5–5.1)
Sodium: 128 mmol/L — ABNORMAL LOW (ref 135–145)

## 2023-12-11 LAB — URINE CULTURE: Culture: >=100000 — AB

## 2023-12-11 LAB — OSMOLALITY, URINE: Osmolality, Ur: 627 mosm/kg (ref 300–900)

## 2023-12-11 MED ORDER — AMOXICILLIN 500 MG PO CAPS
500.0000 mg | ORAL_CAPSULE | Freq: Three times a day (TID) | ORAL | Status: DC
  Administered 2023-12-11: 500 mg via ORAL
  Filled 2023-12-11: qty 1

## 2023-12-11 MED ORDER — AMOXICILLIN 500 MG PO CAPS: 500.0000 mg | ORAL_CAPSULE | Freq: Three times a day (TID) | ORAL | 0 refills | Status: DC

## 2023-12-11 MED ORDER — CIPROFLOXACIN HCL 250 MG PO TABS: 250.0000 mg | ORAL_TABLET | Freq: Two times a day (BID) | ORAL | 0 refills | Status: DC

## 2023-12-11 MED ORDER — POLYETHYLENE GLYCOL 3350 17 GM/SCOOP PO POWD: 17.0000 g | Freq: Every day | ORAL | 3 refills | Status: DC

## 2023-12-11 NOTE — Discharge Summary (Signed)
 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 female with medical history significant for metastatic breast cancer undergoing chemotherapy/radiation, HTN who came into ED complaining of urinary retention.  Patient is stated that last she was able to urinate was 4 PM yesterday.  She denies pain or burning with urination.  Patient is alert awake and oriented.  She denied any fever chills nausea vomiting.    Hospital Course:   # Acute urinary retention Suspect 2/2 constipation which is resolved. Have removed the foley placed in the ER. CT shows no obstruction. Now voiding spontaneously, bladder scan shows no retention   # Acute cystitis Symptomatic, no signs pyelo on CT but does show bladder inflammation, culture growing e faecalis, pan-sensitive - ceftriaxone  switched to amp switched to amox at d/c to complete a 5 day course   # Constipatioin With rectal stool ball, disempacted in ER on 9/11 and again 9/12 at bedside. Given stool softeners here and now multiple BMs after disempactions - start miralax    # Nausea/vomiting Resolved. Nbnb. Ct no etiology, maybe 2/2 malignancy and meds, maybe 2/2 constipation. Tolerating diet   # Hyponatremia Acute on chronic, baseline is low to mid 30s here 127 but was 123 last  week. Studies suggestive of dehydration. 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 - onc f/u this coming week as scheduled  Procedures: none   Consultations: none  Discharge Exam: Vitals:   12/11/23 0502 12/11/23 0834  BP: (!) 147/70 (!) 150/77  Pulse: 79 91  Resp:  16  Temp: 98.3 F (36.8 C) 98.8 F (37.1 C)  SpO2: 90% 97%    General: NAD Cardiovascular: RRR Respiratory: CTAB  Discharge Instructions   Discharge Instructions     Diet - low sodium heart healthy   Complete by: As directed    Increase activity slowly   Complete by: As directed       Allergies as of 12/11/2023       Reactions   Acetaminophen     Codeine    Codone [hydrocodone] Other (See Comments)   hyperactive   Oxycodone Hcl    Percocet [oxycodone-acetaminophen ] Other (See Comments)   lupy   Penicillins Rash        Medication List     PAUSE taking these medications    Kisqali  (400 MG Dose) 200 MG Therapy Pack Wait to take this until your doctor or other care provider tells you to start again. Generic drug: ribociclib  succ Take 2 tablets (400 mg total) by mouth daily. Take for 21 days on, 7 days off, repeat  every 28 days.   torsemide  20 MG tablet Wait to take this until your doctor or other care provider tells you to start again. Commonly known as: DEMADEX  Take 20 mg by mouth in the morning, at noon, in the evening, and at bedtime.       STOP taking these medications    diltiazem 120 MG 24 hr capsule Commonly known as: CARDIZEM CD   potassium chloride 10 MEQ tablet Commonly known as: KLOR-CON       TAKE these medications    amoxicillin  500 MG capsule Commonly known as: AMOXIL  Take 1 capsule (500 mg total) by  mouth every 8 (eight) hours.   ascorbic acid 500 MG tablet Commonly known as: VITAMIN C Take 500 mg by mouth daily.   Calcium-Vitamin D  600-200 MG-UNIT tablet Take 1 tablet by mouth daily.   letrozole  2.5 MG tablet Commonly known as: FEMARA  Take 1 tablet (2.5 mg total) by mouth daily.   metoprolol  tartrate 25 MG tablet Commonly known as: LOPRESSOR  Take 25 mg by mouth 2 (two) times daily.   ondansetron  8 MG tablet Commonly known as: ZOFRAN  One pill every 8 hours as needed for nausea/vomitting.   polyethylene glycol powder 17 GM/SCOOP powder Commonly known as: MiraLax  Take 17 g by mouth daily. Dissolve 1 capful (17g) in 4-8 ounces of liquid and take by mouth daily.       Allergies  Allergen Reactions   Acetaminophen     Codeine    Codone [Hydrocodone] Other (See Comments)    hyperactive   Oxycodone Hcl    Percocet [Oxycodone-Acetaminophen ] Other (See Comments)    lupy   Penicillins Rash    Follow-up Information     Marikay Spear K, PA Follow up.   Specialty: Physician Assistant Why: hospital follow up Contact information: [address] [facility] [address] [phone]                  The results of significant diagnostics from this hospitalization (including imaging, microbiology, ancillary and laboratory) are listed below for reference.    Significant Diagnostic Studies: CT ABDOMEN PELVIS W CONTRAST Result Date: 12/10/2023 EXAM: CT ABDOMEN AND PELVIS WITH CONTRAST 12/10/2023 11:34:43 AM TECHNIQUE: CT of the abdomen and pelvis was performed with the administration of intravenous contrast. Multiplanar reformatted images are provided for review. Automated exposure control, iterative reconstruction, and/or weight-based adjustment of the mA/kV was utilized to reduce the radiation dose to as low as reasonably achievable. COMPARISON: None available. CLINICAL HISTORY: Urinary retention, metastatic breast cancer. FINDINGS: LOWER  CHEST: Small bilateral pleural effusions are seen at the visualized lung bases, right greater than left. Progressive collapse and consolidation within the visualized right middle lobe. Small pericardial effusion. There is extensive coronary artery calcification, largely within the left anterior descending coronary artery. Cardiac size is within normal limits. LIVER: Mild hepatic steatosis. No focal intrahepatic mass identified GALLBLADDER AND BILE DUCTS: Gallbladder is unremarkable. No biliary ductal dilatation. SPLEEN: No acute abnormality. PANCREAS: No acute abnormality. ADRENAL GLANDS: No acute abnormality. KIDNEYS, URETERS AND BLADDER: The bladder is decompressed; however, there is circumferential bladder wall thickening and perivesicular inflammatory stranding, which may reflect superimposed infectious or inflammatory cystitis. No perivesicular fluid collections are identified. GI AND BOWEL: There is moderate sigmoid diverticulosis. There is moderate stool within the rectal vault with superimposed rectal wall thickening and perirectal inflammatory stranding and mild presacral edema, which may reflect changes of a superimposed proctitis, possibly circle proctitis. There is no evidence of obstruction.  PERITONEUM AND RETROPERITONEUM: No ascites. No free air. VASCULATURE: Aorta is normal in caliber. Mild aortoiliac atherosclerotic calcification. LYMPH NODES: No lymphadenopathy. REPRODUCTIVE ORGANS: Status post hysterectomy. No adnexal masses. BONES AND SOFT TISSUES: The osseous structures are age appropriate. No focal lytic or blastic bone lesion. IMPRESSION: 1. Circumferential bladder wall thickening and perivesicular inflammatory stranding, possibly reflecting superimposed infectious or inflammatory cystitis. No perivesicular fluid collections identified. 2. Moderate sigmoid diverticulosis without evidence of diverticulitis. 3. Rectal wall thickening with perirectal inflammatory stranding and mild presacral  edema, possibly reflecting superimposed proctitis. No evidence of obstruction or perforation. 4. No evidence of metastatic disease within abdomen and pelvis Electronically signed by: Dorethia Molt MD 12/10/2023 11:57 AM EDT RP Workstation: [id number]   US  CORE BIOPSY (LYMPH NODES) Result Date: 11/18/2023 INDICATION: Right supraclavicular soft tissue mass and infiltrative tumor in the mediastinum causing SVC syndrome. History of breast carcinoma with presumed recurrence. EXAM: ULTRASOUND GUIDED CORE BIOPSY OF RIGHT SUPRACLAVICULAR SOFT TISSUE MASS MEDICATIONS: None. ANESTHESIA/SEDATION: None PROCEDURE: The procedure, risks, benefits, and alternatives were explained to the patient. Questions regarding the procedure were encouraged and answered. The patient understands and consents to the procedure. A time out was performed prior to initiating the procedure. The right neck was prepped with chlorhexidine in a sterile fashion, and a sterile drape was applied covering the operative field. A sterile gown and sterile gloves were used for the procedure. Local anesthesia was provided with 1% Lidocaine . Ultrasound was used to localize abnormal soft tissue in the right neck. Under ultrasound guidance, a 17 gauge trocar needle was advanced to the margin of the mass. Coaxial 18 gauge core biopsy samples were obtained with 4 passes made. Material was placed on saline soaked Telfa gauze. Additional ultrasound was performed. COMPLICATIONS: None immediate. FINDINGS: At first it was very difficult to localize a discrete soft tissue mass in the right supraclavicular region. Utilizing a chest CT performed yesterday for landmarks, abnormal vaguely marginated hypoechoic soft tissue was identified lying lateral to the right common carotid artery and corresponding in location to the CT abnormality. This measures approximately 3.4 cm in transverse diameter which corresponds well in size to the CT scan. The tissue was very firm by biopsy  and yielded 1 completely intact and 3 partially fragmented core biopsy samples. IMPRESSION: Ultrasound-guided core biopsy of right supraclavicular mass seen as hypoechoic soft tissue by ultrasound measuring up to 3.4 cm in estimated diameter. Core biopsy yielded solid and fragmented tissue. Electronically Signed   By: Marcey Moan M.D.   On: 11/18/2023 16:22   CT VENOGRAM CHEST Result Date: 11/17/2023 CLINICAL DATA:  SVC syndrome. History of breast cancer and mediastinal mass. EXAM: CT VENORAPHY CHEST WITH CONTRAST TECHNIQUE: Multidetector CT imaging of the chest was performed using the standard protocol during bolus administration of intravenous contrast. Multiplanar CT image reconstructions and MIPs were obtained to evaluate the vascular anatomy. RADIATION DOSE REDUCTION: This exam was performed according to the departmental dose-optimization program which includes automated exposure control, adjustment of the mA and/or kV according to patient size and/or use of iterative reconstruction technique. CONTRAST:  OMNIPAQUE  IOHEXOL  350 MG/ML SOLN COMPARISON:  Chest CT dated 08/15/2023. FINDINGS: Cardiovascular: There is no cardiomegaly. There is 3 vessel coronary vascular calcification. Moderate pericardial effusion increased in size since the prior CT measuring up to 17 mm in thickness. Mild atherosclerotic calcification of the thoracic aorta. No aneurysmal dilatation or dissection. The origins of the great vessels of the aortic arch and the central pulmonary arteries are patent. Apparent  filling defects within the central vessels in the right hilum (coronal series 5 images 52 and 53) are suboptimally evaluated but may be within the pulmonary artery branches and represent nonocclusive PEs. Alternatively these may be extension of infiltrative tissue and tumor thrombus within the pulmonary vasculature. Mediastinum/Nodes: There is infiltrative tissue in the upper mediastinum encasing the roots of the branches  of the great vessels of the aortic arch, left innominate vein, and the SVC. Interval increase in the size of the mediastinal tissue since the prior CT. For example a component of the tissue encasing the origin of the left common carotid artery measures approximately 3.8 x 3.8 cm (previously 2.7 x 3.4 cm). There is high-grade narrowing of the central SVC involving approximately 5 cm length of the vessel. There is also narrowing of the innominate vein. Infiltrating tissue abuts the anterior trachea and appears to encase the bifurcation of the trachea. There is encasement of the proximal right subclavian artery with mild focal narrowing of this vessel. The origins of the great vessels of the aortic arch however remain patent. Several rounded small mediastinal lymph nodes noted. There is a 12 x 16 mm low attenuating nodule in the right hilum which may represent infiltrative mass or adenopathy. There is mild infiltration of the fat plane along the anterior esophagus. Lungs/Pleura: Background of emphysema. Small right pleural effusion, new since the prior CT. There is partial compressive atelectasis of the right lower lobe. Pneumonia is not excluded. There is no pneumothorax. The central airways remain patent. Upper Abdomen: No acute abnormality. Musculoskeletal: Small lucent lesion in the posterior aspect of the T2 suspicious for metastatic disease. No acute osseous pathology. Review of the MIP images confirms the above findings. IMPRESSION: 1. Interval increase in the size of the infiltrative tissue in the upper mediastinum encasing the roots of the great vessels of the aortic arch, left innominate vein, and the SVC. There is high-grade narrowing of the central SVC involving approximately 5 cm length of the vessel. There is also narrowing of the innominate vein. 2. Apparent filling defects within the central vessels in the right hilum are suboptimally evaluated but may be within the pulmonary artery branches and  represent nonocclusive PEs. Alternatively these may be extension of infiltrative tissue and tumor thrombus within the pulmonary vasculature. 3. Small right pleural effusion with partial compressive atelectasis of the right lower lobe. Pneumonia is not excluded. 4. Moderate pericardial effusion increased in size since the prior CT. 5. Small lucent lesion in the posterior aspect of the T2 suspicious for metastatic disease. 6. Aortic Atherosclerosis (ICD10-I70.0) and Emphysema (ICD10-J43.9). These results will be called to the ordering clinician or representative by the Radiologist Assistant, and communication documented in the PACS or [employer]. Electronically Signed   By: Vanetta Chou M.D.   On: 11/17/2023 18:13    Microbiology: Recent Results (from the past 240 hours)  Urine Culture     Status: Abnormal   Collection Time: 12/09/23  9:46 AM   Specimen: Urine, Clean Catch  Result Value Ref Range Status   Specimen Description   Final    URINE, CLEAN CATCH Performed at [facility], [address] [place], [place] [address]    Special Requests   Final    NONE Performed at [facility], [address] [place], [place] [address]    Culture >=100,000 COLONIES/mL ENTEROCOCCUS FAECALIS (A)  Final   Report Status 12/11/2023 FINAL  Final   Organism ID, Bacteria ENTEROCOCCUS FAECALIS (A)  Final  Susceptibility   Enterococcus faecalis - MIC*    AMPICILLIN  <=2 SENSITIVE Sensitive     NITROFURANTOIN <=16 SENSITIVE Sensitive     VANCOMYCIN 1 SENSITIVE Sensitive     * >=100,000 COLONIES/mL ENTEROCOCCUS FAECALIS     Labs: Basic Metabolic Panel: Recent Labs  Lab 12/09/23 0946 12/09/23 1354 12/10/23 0520 12/11/23 0445  NA 124*  --  127* 128*  K 4.2  --  4.4 5.1  CL 86*  --  96* 98  CO2 26  --  24 25  GLUCOSE 123*  --  105* 109*  BUN 22  --  14 11  CREATININE 1.09* 0.85 0.74 0.93  CALCIUM 9.4  --  9.0 9.2   Liver Function Tests: Recent Labs  Lab  12/09/23 0946 12/10/23 0520  AST 23 25  ALT 27 27  ALKPHOS 56 46  BILITOT 1.6* 1.1  PROT 7.2 6.4*  ALBUMIN 3.9 3.4*   No results for input(s): LIPASE, AMYLASE in the last 168 hours. No results for input(s): AMMONIA in the last 168 hours. CBC: Recent Labs  Lab 12/09/23 0946 12/09/23 1354 12/10/23 0520  WBC 6.8 6.9 5.2  NEUTROABS 6.2  --   --   HGB 13.1 12.4 12.2  HCT 39.2 37.1 34.7*  MCV 91.8 91.8 90.8  PLT 293 280 264   Cardiac Enzymes: No results for input(s): CKTOTAL, CKMB, CKMBINDEX, TROPONINI in the last 168 hours. BNP: BNP (last 3 results) Recent Labs    08/15/23 1343 08/15/23 1635  BNP 42.8 41.5    ProBNP (last 3 results) No results for input(s): PROBNP in the last 8760 hours.  CBG: No results for input(s): GLUCAP in the last 168 hours.     Signed:  Devaughn KATHEE Ban MD.  [facility] 12/11/2023, 12:26 PM

## 2023-12-11 NOTE — Plan of Care (Signed)

## 2023-12-11 NOTE — Plan of Care (Signed)
  Problem: Education: Goal: Knowledge of General Education information will improve Description: Including pain rating scale, medication(s)/side effects and non-pharmacologic comfort measures Outcome: Progressing   Problem: Health Behavior/Discharge Planning: Goal: Ability to manage health-related needs will improve Outcome: Progressing   Problem: Clinical Measurements: Goal: Ability to maintain clinical measurements within normal limits will improve Outcome: Progressing   Problem: Coping: Goal: Level of anxiety will decrease Outcome: Progressing   Problem: Pain Managment: Goal: General experience of comfort will improve and/or be controlled Outcome: Progressing   Problem: Safety: Goal: Ability to remain free from injury will improve Outcome: Progressing   Problem: Skin Integrity: Goal: Risk for impaired skin integrity will decrease Outcome: Progressing

## 2023-12-11 NOTE — Progress Notes (Signed)
 MD order received in [facility] to discharge pt home today; verbally reviewed AVS with pt, no questions voiced at this time; pt's discharge pending her getting dressed

## 2023-12-13 ENCOUNTER — Encounter: Payer: Self-pay | Admitting: Internal Medicine

## 2023-12-13 ENCOUNTER — Inpatient Hospital Stay

## 2023-12-13 ENCOUNTER — Inpatient Hospital Stay (HOSPITAL_BASED_OUTPATIENT_CLINIC_OR_DEPARTMENT_OTHER): Admitting: Internal Medicine

## 2023-12-13 ENCOUNTER — Other Ambulatory Visit: Payer: Self-pay

## 2023-12-13 ENCOUNTER — Ambulatory Visit
Admission: RE | Admit: 2023-12-13 | Discharge: 2023-12-13 | Disposition: A | Discharge status: Home / Self Care | Source: Ambulatory Visit | Attending: Radiation Oncology | Admitting: Radiation Oncology

## 2023-12-13 ENCOUNTER — Telehealth: Payer: Self-pay | Admitting: *Deleted

## 2023-12-13 DIAGNOSIS — Z170 Estrogen receptor positive status [ER+]: Secondary | ICD-10-CM

## 2023-12-13 DIAGNOSIS — I871 Compression of vein: Secondary | ICD-10-CM

## 2023-12-13 DIAGNOSIS — C50312 Malignant neoplasm of lower-inner quadrant of left female breast: Secondary | ICD-10-CM

## 2023-12-13 DIAGNOSIS — Z510 Encounter for antineoplastic radiation therapy: Secondary | ICD-10-CM | POA: Diagnosis not present

## 2023-12-13 LAB — CBC WITH DIFFERENTIAL (CANCER CENTER ONLY)
Abs Immature Granulocytes: 0.03 K/uL (ref 0.00–0.07)
Basophils Absolute: 0 K/uL (ref 0.0–0.1)
Basophils Relative: 1 %
Eosinophils Absolute: 0 K/uL (ref 0.0–0.5)
Eosinophils Relative: 0 %
HCT: 37.1 % (ref 36.0–46.0)
Hemoglobin: 12.4 g/dL (ref 12.0–15.0)
Immature Granulocytes: 1 %
Lymphocytes Relative: 9 %
Lymphs Abs: 0.3 K/uL — ABNORMAL LOW (ref 0.7–4.0)
MCH: 31.2 pg (ref 26.0–34.0)
MCHC: 33.4 g/dL (ref 30.0–36.0)
MCV: 93.5 fL (ref 80.0–100.0)
Monocytes Absolute: 0.2 K/uL (ref 0.1–1.0)
Monocytes Relative: 5 %
Neutro Abs: 3.2 K/uL (ref 1.7–7.7)
Neutrophils Relative %: 84 %
Platelet Count: 227 K/uL (ref 150–400)
RBC: 3.97 MIL/uL (ref 3.87–5.11)
RDW: 13.5 % (ref 11.5–15.5)
WBC Count: 3.8 K/uL — ABNORMAL LOW (ref 4.0–10.5)
nRBC: 0 % (ref 0.0–0.2)

## 2023-12-13 LAB — MAGNESIUM: Magnesium: 2.4 mg/dL (ref 1.7–2.4)

## 2023-12-13 LAB — RAD ONC ARIA SESSION SUMMARY
Course Elapsed Days: 18
Plan Fractions Treated to Date: 10
Plan Prescribed Dose Per Fraction: 2 Gy
Plan Total Fractions Prescribed: 20
Plan Total Prescribed Dose: 40 Gy
Reference Point Dosage Given to Date: 20 Gy
Reference Point Session Dosage Given: 2 Gy
Session Number: 10

## 2023-12-13 LAB — CMP (CANCER CENTER ONLY)
ALT: 34 U/L (ref 0–44)
AST: 26 U/L (ref 15–41)
Albumin: 3.8 g/dL (ref 3.5–5.0)
Alkaline Phosphatase: 53 U/L (ref 38–126)
Anion gap: 10 (ref 5–15)
BUN: 14 mg/dL (ref 8–23)
CO2: 22 mmol/L (ref 22–32)
Calcium: 9.1 mg/dL (ref 8.9–10.3)
Chloride: 96 mmol/L — ABNORMAL LOW (ref 98–111)
Creatinine: 0.81 mg/dL (ref 0.44–1.00)
GFR, Estimated: >60 mL/min (ref >60)
Glucose, Bld: 103 mg/dL — ABNORMAL HIGH (ref 70–99)
Potassium: 3.9 mmol/L (ref 3.5–5.1)
Sodium: 128 mmol/L — ABNORMAL LOW (ref 135–145)
Total Bilirubin: 0.9 mg/dL (ref 0.0–1.2)
Total Protein: 6.7 g/dL (ref 6.5–8.1)

## 2023-12-13 MED ORDER — DEXAMETHASONE 2 MG PO TABS: ORAL_TABLET | ORAL | 0 refills | Status: DC

## 2023-12-13 NOTE — Telephone Encounter (Signed)
 EKG scheduled in cancer center today was put on HOLD/ canceled for today.

## 2023-12-13 NOTE — Addendum Note (Signed)
 Addended by: LAEL BROWNING A on: 12/13/2023 12:32 PM   Modules accepted: Orders

## 2023-12-13 NOTE — Progress Notes (Signed)
 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 Smith]]  ER/PR- >90%; Her 2 Nue- NEG; Lumpec & SLNBx Oncotype-RS=19 No Chemo; s/p RT [Dr.Crystal] April 2016- Arimidex; Stopped Oct 2016; START OCT 2016- Femara ; Sep 19th stopped sec to [employer]; sep 19th 2019- START AROMASIN ; stop summer 2021.  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 PET scan- JULY 2025- Confluent abnormal soft tissue along the upper mediastinum. More towards the right side with involvement of the right lung hilum and separate tissue or nodes thoracic inlet on the right side adjacent to the right clavicle corresponding to the finding by prior CT scan. here are 4 hypermetabolic small bony lesions identified including thoracic spine at T1 and T2, the right acetabulum and left scapula worrisome for bony metastatic disease. A thoracic spine MRI may be of some benefit to further characterize the spine lesion. Two small nonspecific areas of asymmetric uptake along skeletal muscle of the left gluteus medius muscle and the left pectoralis muscle.   NGS-pending.   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   # BMD- Osteopenia [may 2016]; AUG 2018- T score= -1.1 [improved] ------------------------------------------------------------   DIAGNOSIS: BREAST CANCER    CURRENT/MOST RECENT THERAPY: Surveillance.    Carcinoma of lower-inner quadrant of left breast in female, estrogen receptor positive (HCC)  04/12/2020  Cancer Staging   Staging form: Breast, AJCC 8th Edition - Clinical: Stage IB (cT2, cN0, cM0, G2, ER+, PR+, HER2-) - Signed by Reiley Bertagnolli R, MD on 04/12/2020     HISTORY OF PRESENTING ILLNESS: Patient ambulating-independently. Accompanied by husband.  Dana Snow 78 y.o.  female pleasant patient with recurrent breast cancer-stage IV-ER pos;her 2 NEG- with SVC syndrome-currently on radiation and also Ribociclib  plus letrozole  is here for follow-up.   In the interim patient was admitted to the hospital for UTI/hyponatremia.  Discharged home on oral antibiotics.  Also had imaging including CT of the abdomen pelvis in the hospital.  Denies any fevers or chills.  Denies any nausea.  Complains of sore throat. C/o with not eating well. Getting choked. N/V in hospital.  Difficulty eating- ? Choking. Complains of ongoing fatigue. Also continues to have hoarseness of voice  Continues to have swelling of the arms and face and hands.   Review of Systems  Constitutional:  Positive for malaise/fatigue and weight loss. Negative for chills, diaphoresis and fever.  HENT:  Negative for nosebleeds and sore throat.   Eyes:  Negative for double vision.  Respiratory:  Positive for cough and shortness of breath. Negative for hemoptysis, sputum production and wheezing.   Cardiovascular:  Negative for chest pain, palpitations, orthopnea and leg swelling.  Gastrointestinal:  Negative for abdominal pain, blood in stool, constipation, diarrhea, heartburn, melena, nausea and vomiting.  Genitourinary:  Negative for dysuria, frequency and urgency.  Musculoskeletal:  Negative for back pain and joint pain.  Skin: Negative.  Negative for itching and rash.  Neurological:  Positive for weakness. Negative for dizziness, tingling, focal weakness and headaches.  Endo/Heme/Allergies:  Does not bruise/bleed easily.  Psychiatric/Behavioral:  Negative for depression. The patient is not nervous/anxious and does not have  insomnia.     MEDICAL HISTORY:  Past Medical History:  Diagnosis Date   Breast cancer (HCC) 2015   left lumpectomy 04/05/2014 and reexcision 04/27/2014 - radiation treatment   History of COVID-19    Hypertension    Personal history of radiation therapy 2016   Left breast    SURGICAL HISTORY: Past Surgical History:  Procedure Laterality Date   ABDOMINAL HYSTERECTOMY     BREAST BIOPSY Left 03/15/2014   8:00 us  bx, SCLEROSING ADENOSIS AND COLUMNAR CELL CHANGE    BREAST BIOPSY Left 02/27/2014   invasive lobular carcinoma LIQ   BREAST LUMPECTOMY Left 04/03/2014   [facility], DCIS, ALH, negative LN   BREAST LUMPECTOMY Left 04/27/2014   re-excision for clear margins   COMBINED HYSTEROSCOPY DIAGNOSTIC / D&C     PARTIAL MASTECTOMY WITH AXILLARY SENTINEL LYMPH NODE BIOPSY Left     SOCIAL HISTORY: Social History   Socioeconomic History   Marital status: Married    Spouse name: Not on file   Number of children: Not on file   Years of education: Not on file   Highest education level: Not on file  Occupational History   Not on file  Tobacco Use   Smoking status: Former    Current packs/day: 1.50    Types: Cigarettes   Smokeless tobacco: Never  Vaping Use   Vaping status: Never Used  Substance and Sexual Activity   Alcohol use: Yes    Alcohol/week: 2.0 standard drinks of alcohol    Types: 2 Glasses of wine per week   Drug use: Never   Sexual activity: Never  Other Topics Concern   Not on file  Social History Narrative   Not on file   Social Drivers of Health   Financial Resource Strain: Low Risk  (03/11/2023)   Received from [facility]   Overall Financial Resource Strain (CARDIA)    Difficulty of Paying Living Expenses: Not hard at all  Food Insecurity: No Food Insecurity (12/09/2023)   Hunger Vital Sign    Worried About Running Out of Food in the Last Year: Never true    Ran Out of Food in the Last Year: Never true  Transportation Needs: No  Transportation Needs (12/09/2023)   PRAPARE - Administrator, Civil Service (Medical): No    Lack of Transportation (Non-Medical): No  Physical Activity: Not on file  Stress: Not on file  Social Connections: Socially Integrated (12/09/2023)   Social Connection and Isolation Panel    Frequency of Communication with Friends and Family: Three times a week    Frequency of Social Gatherings with Friends and Family: Three times a week    Attends Religious Services: More than 4 times per year    Active Member of Clubs or Organizations: Yes    Attends Banker Meetings: More than 4 times per year    Marital Status: Married  Catering manager Violence: Not At Risk (12/09/2023)   Humiliation, Afraid, Rape, and Kick questionnaire    Fear of Current or Ex-Partner: No    Emotionally Abused: No    Physically Abused: No    Sexually Abused: No    FAMILY HISTORY: Family History  Problem Relation Age of Onset   Breast cancer Neg Hx     ALLERGIES:  is allergic to acetaminophen , codeine, codone [hydrocodone], oxycodone hcl, percocet [oxycodone-acetaminophen ], and penicillins.  MEDICATIONS:  Current Outpatient  Medications  Medication Sig Dispense Refill   Calcium-Vitamin D  600-200 MG-UNIT per tablet Take 1 tablet by mouth daily.     ciprofloxacin  (CIPRO ) 250 MG tablet Take 1 tablet (250 mg total) by mouth 2 (two) times daily. 6 tablet 0   dexamethasone  (DECADRON ) 2 MG tablet Take 1 pill twice a day.  Take the second pill early in the evening.  Take with food. 14 tablet 0   letrozole  (FEMARA ) 2.5 MG tablet Take 1 tablet (2.5 mg total) by mouth daily. 90 tablet 1   metoprolol  tartrate (LOPRESSOR ) 25 MG tablet Take 25 mg by mouth 2 (two) times daily.     ondansetron  (ZOFRAN ) 8 MG tablet One pill every 8 hours as needed for nausea/vomitting. 40 tablet 1   polyethylene glycol powder (MIRALAX ) 17 GM/SCOOP powder Take 17 g by mouth daily. Dissolve 1 capful (17g) in 4-8 ounces of liquid  and take by mouth daily. 238 g 3   vitamin C (ASCORBIC ACID) 500 MG tablet Take 500 mg by mouth daily.     [Paused] ribociclib  succ (KISQALI  400MG  DAILY DOSE) 200 MG Therapy Pack Take 2 tablets (400 mg total) by mouth daily. Take for 21 days on, 7 days off, repeat every 28 days. (Patient not taking: Reported on 12/13/2023) 42 tablet 0   [Paused] torsemide  (DEMADEX ) 20 MG tablet Take 20 mg by mouth in the morning, at noon, in the evening, and at bedtime. (Patient not taking: Reported on 12/13/2023)     No current facility-administered medications for this visit.    PHYSICAL EXAMINATION:   Vitals:   12/13/23 0818  BP: 131/88  Pulse: (!) 114  Resp: 12  Temp: 98.2 F (36.8 C)  SpO2: 95%   Filed Weights   12/13/23 0818  Weight: 133 lb 12.8 oz (60.7 kg)   Facial puffiness noted.  Right pupil smaller- and drooping noted.   Venous engorgement on the chest noted.   Bilateral upper extremity swelling noted.  Physical Exam Vitals and nursing note reviewed.  HENT:     Head: Normocephalic and atraumatic.     Mouth/Throat:     Pharynx: Oropharynx is clear.  Eyes:     Extraocular Movements: Extraocular movements intact.     Pupils: Pupils are equal, round, and reactive to light.  Cardiovascular:     Rate and Rhythm: Normal rate and regular rhythm.  Pulmonary:     Comments: Decreased breath sounds bilaterally.  Abdominal:     Palpations: Abdomen is soft.  Musculoskeletal:        General: Normal range of motion.     Cervical back: Normal range of motion.  Skin:    General: Skin is warm.  Neurological:     General: No focal deficit present.     Mental Status: She is alert and oriented to person, place, and time.  Psychiatric:        Behavior: Behavior normal.        Judgment: Judgment normal.     LABORATORY DATA:  I have reviewed the data as listed Lab Results  Component Value Date   WBC 5.2 12/10/2023   HGB 12.2 12/10/2023   HCT 34.7 (L) 12/10/2023   MCV 90.8 12/10/2023    PLT 264 12/10/2023   Recent Labs    12/02/23 1104 12/09/23 0946 12/09/23 1354 12/10/23 0520 12/11/23 0445  NA 123* 124*  --  127* 128*  K 3.6 4.2  --  4.4 5.1  CL 80* 86*  --  96*  98  CO2 31 26  --  24 25  GLUCOSE 111* 123*  --  105* 109*  BUN 19 22  --  14 11  CREATININE 1.11* 1.09* 0.85 0.74 0.93  CALCIUM 9.8 9.4  --  9.0 9.2  GFRNONAA 51* 52* >60 >60 >60  PROT 7.1 7.2  --  6.4*  --   ALBUMIN 4.0 3.9  --  3.4*  --   AST 28 23  --  25  --   ALT 31 27  --  27  --   ALKPHOS 54 56  --  46  --   BILITOT 1.0 1.6*  --  1.1  --     RADIOGRAPHIC STUDIES: I have personally reviewed the radiological images as listed and agreed with the findings in the report. CT ABDOMEN PELVIS W CONTRAST Result Date: 12/10/2023 EXAM: CT ABDOMEN AND PELVIS WITH CONTRAST 12/10/2023 11:34:43 AM TECHNIQUE: CT of the abdomen and pelvis was performed with the administration of intravenous contrast. Multiplanar reformatted images are provided for review. Automated exposure control, iterative reconstruction, and/or weight-based adjustment of the mA/kV was utilized to reduce the radiation dose to as low as reasonably achievable. COMPARISON: None available. CLINICAL HISTORY: Urinary retention, metastatic breast cancer. FINDINGS: LOWER CHEST: Small bilateral pleural effusions are seen at the visualized lung bases, right greater than left. Progressive collapse and consolidation within the visualized right middle lobe. Small pericardial effusion. There is extensive coronary artery calcification, largely within the left anterior descending coronary artery. Cardiac size is within normal limits. LIVER: Mild hepatic steatosis. No focal intrahepatic mass identified GALLBLADDER AND BILE DUCTS: Gallbladder is unremarkable. No biliary ductal dilatation. SPLEEN: No acute abnormality. PANCREAS: No acute abnormality. ADRENAL GLANDS: No acute abnormality. KIDNEYS, URETERS AND BLADDER: The bladder is decompressed; however, there is  circumferential bladder wall thickening and perivesicular inflammatory stranding, which may reflect superimposed infectious or inflammatory cystitis. No perivesicular fluid collections are identified. GI AND BOWEL: There is moderate sigmoid diverticulosis. There is moderate stool within the rectal vault with superimposed rectal wall thickening and perirectal inflammatory stranding and mild presacral edema, which may reflect changes of a superimposed proctitis, possibly circle proctitis. There is no evidence of obstruction. PERITONEUM AND RETROPERITONEUM: No ascites. No free air. VASCULATURE: Aorta is normal in caliber. Mild aortoiliac atherosclerotic calcification. LYMPH NODES: No lymphadenopathy. REPRODUCTIVE ORGANS: Status post hysterectomy. No adnexal masses. BONES AND SOFT TISSUES: The osseous structures are age appropriate. No focal lytic or blastic bone lesion. IMPRESSION: 1. Circumferential bladder wall thickening and perivesicular inflammatory stranding, possibly reflecting superimposed infectious or inflammatory cystitis. No perivesicular fluid collections identified. 2. Moderate sigmoid diverticulosis without evidence of diverticulitis. 3. Rectal wall thickening with perirectal inflammatory stranding and mild presacral edema, possibly reflecting superimposed proctitis. No evidence of obstruction or perforation. 4. No evidence of metastatic disease within abdomen and pelvis Electronically signed by: Dorethia Molt MD 12/10/2023 11:57 AM EDT RP Workstation: [id number]   US  CORE BIOPSY (LYMPH NODES) Result Date: 11/18/2023 INDICATION: Right supraclavicular soft tissue mass and infiltrative tumor in the mediastinum causing SVC syndrome. History of breast carcinoma with presumed recurrence. EXAM: ULTRASOUND GUIDED CORE BIOPSY OF RIGHT SUPRACLAVICULAR SOFT TISSUE MASS MEDICATIONS: None. ANESTHESIA/SEDATION: None PROCEDURE: The procedure, risks, benefits, and alternatives were explained to the patient.  Questions regarding the procedure were encouraged and answered. The patient understands and consents to the procedure. A time out was performed prior to initiating the procedure. The right neck was prepped with chlorhexidine in a sterile fashion, and a sterile drape  was applied covering the operative field. A sterile gown and sterile gloves were used for the procedure. Local anesthesia was provided with 1% Lidocaine . Ultrasound was used to localize abnormal soft tissue in the right neck. Under ultrasound guidance, a 17 gauge trocar needle was advanced to the margin of the mass. Coaxial 18 gauge core biopsy samples were obtained with 4 passes made. Material was placed on saline soaked Telfa gauze. Additional ultrasound was performed. COMPLICATIONS: None immediate. FINDINGS: At first it was very difficult to localize a discrete soft tissue mass in the right supraclavicular region. Utilizing a chest CT performed yesterday for landmarks, abnormal vaguely marginated hypoechoic soft tissue was identified lying lateral to the right common carotid artery and corresponding in location to the CT abnormality. This measures approximately 3.4 cm in transverse diameter which corresponds well in size to the CT scan. The tissue was very firm by biopsy and yielded 1 completely intact and 3 partially fragmented core biopsy samples. IMPRESSION: Ultrasound-guided core biopsy of right supraclavicular mass seen as hypoechoic soft tissue by ultrasound measuring up to 3.4 cm in estimated diameter. Core biopsy yielded solid and fragmented tissue. Electronically Signed   By: Marcey Moan M.D.   On: 11/18/2023 16:22   CT VENOGRAM CHEST Result Date: 11/17/2023 CLINICAL DATA:  SVC syndrome. History of breast cancer and mediastinal mass. EXAM: CT VENORAPHY CHEST WITH CONTRAST TECHNIQUE: Multidetector CT imaging of the chest was performed using the standard protocol during bolus administration of intravenous contrast. Multiplanar CT image  reconstructions and MIPs were obtained to evaluate the vascular anatomy. RADIATION DOSE REDUCTION: This exam was performed according to the departmental dose-optimization program which includes automated exposure control, adjustment of the mA and/or kV according to patient size and/or use of iterative reconstruction technique. CONTRAST:  OMNIPAQUE  IOHEXOL  350 MG/ML SOLN COMPARISON:  Chest CT dated 08/15/2023. FINDINGS: Cardiovascular: There is no cardiomegaly. There is 3 vessel coronary vascular calcification. Moderate pericardial effusion increased in size since the prior CT measuring up to 17 mm in thickness. Mild atherosclerotic calcification of the thoracic aorta. No aneurysmal dilatation or dissection. The origins of the great vessels of the aortic arch and the central pulmonary arteries are patent. Apparent filling defects within the central vessels in the right hilum (coronal series 5 images 52 and 53) are suboptimally evaluated but may be within the pulmonary artery branches and represent nonocclusive PEs. Alternatively these may be extension of infiltrative tissue and tumor thrombus within the pulmonary vasculature. Mediastinum/Nodes: There is infiltrative tissue in the upper mediastinum encasing the roots of the branches of the great vessels of the aortic arch, left innominate vein, and the SVC. Interval increase in the size of the mediastinal tissue since the prior CT. For example a component of the tissue encasing the origin of the left common carotid artery measures approximately 3.8 x 3.8 cm (previously 2.7 x 3.4 cm). There is high-grade narrowing of the central SVC involving approximately 5 cm length of the vessel. There is also narrowing of the innominate vein. Infiltrating tissue abuts the anterior trachea and appears to encase the bifurcation of the trachea. There is encasement of the proximal right subclavian artery with mild focal narrowing of this vessel. The origins of the great vessels  of the aortic arch however remain patent. Several rounded small mediastinal lymph nodes noted. There is a 12 x 16 mm low attenuating nodule in the right hilum which may represent infiltrative mass or adenopathy. There is mild infiltration of the fat plane along the  anterior esophagus. Lungs/Pleura: Background of emphysema. Small right pleural effusion, new since the prior CT. There is partial compressive atelectasis of the right lower lobe. Pneumonia is not excluded. There is no pneumothorax. The central airways remain patent. Upper Abdomen: No acute abnormality. Musculoskeletal: Small lucent lesion in the posterior aspect of the T2 suspicious for metastatic disease. No acute osseous pathology. Review of the MIP images confirms the above findings. IMPRESSION: 1. Interval increase in the size of the infiltrative tissue in the upper mediastinum encasing the roots of the great vessels of the aortic arch, left innominate vein, and the SVC. There is high-grade narrowing of the central SVC involving approximately 5 cm length of the vessel. There is also narrowing of the innominate vein. 2. Apparent filling defects within the central vessels in the right hilum are suboptimally evaluated but may be within the pulmonary artery branches and represent nonocclusive PEs. Alternatively these may be extension of infiltrative tissue and tumor thrombus within the pulmonary vasculature. 3. Small right pleural effusion with partial compressive atelectasis of the right lower lobe. Pneumonia is not excluded. 4. Moderate pericardial effusion increased in size since the prior CT. 5. Small lucent lesion in the posterior aspect of the T2 suspicious for metastatic disease. 6. Aortic Atherosclerosis (ICD10-I70.0) and Emphysema (ICD10-J43.9). These results will be called to the ordering clinician or representative by the Radiologist Assistant, and communication documented in the PACS or [employer]. Electronically Signed   By: Vanetta Chou M.D.   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 PET scan- JULY 2025- Confluent abnormal soft tissue along the upper mediastinum. More towards the right side with involvement of the right lung hilum and separate tissue or nodes thoracic inlet on the right side adjacent to the right clavicle corresponding to the finding by prior CT scan. here are 4 hypermetabolic small bony lesions identified including thoracic spine at T1 and T2, the right acetabulum and left scapula worrisome for bony metastatic disease. A thoracic spine MRI may be of some benefit to further characterize the spine lesion. Two small nonspecific areas of asymmetric uptake along skeletal muscle of the left gluteus medius muscle and the left pectoralis muscle.  Awaiting on NGS. SEP 2025-  Currently on on  letrozole  + Ribociclib .  # Given UTI- HOLD ribociclib  [ lower dose-400 mg 3 weeks on 1 week off-]- continue letrzole.   Consider Zometa down the line.   # SEP 2025- [facility]- ENTEROCOCCUS FAECALIS - recommend continue amoxicillin ; DC cipro .   #SVC syndrome-/ anisocoria- radiation esophagitis-recommend Magic mouthwash.  Also recommend dexamethasone  to help with the swelling.  With worsening of the facial swelling recommend  20 mg demadex  one pill a day. Also recommend dex 2 mg BID x7 days.   # hyponatremia-multifactorial-sodium in the 128 recently-cautiously restart Demadex .  Blood pressure reasonable.   # OSTEOPENIA- [BMD= T score- 1.4]; continue ca+ vit D; exercise; monitor closely on therapy   # IV accesss: difficult with larger needles -patient reluctant with port.  However will discuss with Dr. Marea.  # DISPOSITION: #  Magic mouthwash.  # HOLD off EKG today # follow up in 1 week- MD:  labs- cbc/bmp mag-  Dr.B  # 40 minutes face-to-face with the patient discussing the above plan of care; more  than 50% of time spent on prognosis/ natural history; counseling and coordination.    Above plan of care was discussed with patient/family in detail.  My contact information was given to the patient/family.     Cindy JONELLE Joe, MD 12/13/2023 9:42 AM

## 2023-12-13 NOTE — Assessment & Plan Note (Addendum)
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 PET scan- JULY 2025- Confluent abnormal soft tissue along the upper mediastinum. More towards the right side with involvement of the right lung hilum and separate tissue or nodes thoracic inlet on the right side adjacent to the right clavicle corresponding to the finding by prior CT scan. here are 4 hypermetabolic small bony lesions identified including thoracic spine at T1 and T2, the right acetabulum and left scapula worrisome for bony metastatic disease. A thoracic spine MRI may be of some benefit to further characterize the spine lesion. Two small nonspecific areas of asymmetric uptake along skeletal muscle of the left gluteus medius muscle and the left pectoralis muscle.  Awaiting on NGS. SEP 2025-  Currently on on  letrozole  + Ribociclib .  # Given UTI- HOLD ribociclib  [ lower dose-400 mg 3 weeks on 1 week off-]- continue letrzole.   Consider Zometa down the line.   # SEP 2025- [facility]- ENTEROCOCCUS FAECALIS - recommend continue amoxicillin ; DC cipro .   #SVC syndrome-/ anisocoria- radiation esophagitis-recommend Magic mouthwash.  Also recommend dexamethasone  to help with the swelling.  With worsening of the facial swelling recommend  20 mg demadex  one pill a day. Also recommend dex 2 mg BID x7 days.   # hyponatremia-multifactorial-sodium in the 128 recently-cautiously restart Demadex .  Blood pressure reasonable.   # OSTEOPENIA- [BMD= T score- 1.4]; continue ca+ vit D; exercise; monitor closely on therapy   # IV accesss: difficult with larger needles -patient reluctant with port.  However will discuss with Dr. Marea.  # DISPOSITION: #  Magic mouthwash.  # HOLD off EKG today # follow up in 1 week- MD: labs- cbc/bmp mag-  Dr.B  # 40 minutes face-to-face with the patient discussing the above plan of care; more than 50% of time spent on  prognosis/ natural history; counseling and coordination.

## 2023-12-13 NOTE — Progress Notes (Signed)
 Seen at [facility] , started cipro  for uti. ER only gave her a 3 day supply and said you would have to fill the remaining rx if needed. No fever that she know of. Sore throat since Saturday. Swelling in face, arms, neck eyes and hands. Didn't want to give blood until she saw you first. Trouble breathing, hoarse. 12/10/23 CT abd/pelvis.  C/o with not eating well. Getting choked. N/V in hospital. C/o with the fluid. Wants to know if she needs an anti-histamine. C/o having no energy.

## 2023-12-13 NOTE — Patient Instructions (Signed)
#   Finish of the antibiotic prescribed in the hospital  # Take a decongestant  # Start Demadex /pill 1 a day  # Take dexamethasone  as ordered; take Magic mouthwash as ordered-for sore throat.

## 2023-12-13 NOTE — Addendum Note (Signed)
 Addended by: JOSHUA ALFONSO CROME on: 12/13/2023 10:31 AM   Modules accepted: Orders

## 2023-12-14 ENCOUNTER — Ambulatory Visit
Admission: RE | Admit: 2023-12-14 | Discharge: 2023-12-14 | Disposition: A | Discharge status: Home / Self Care | Source: Ambulatory Visit | Attending: Radiation Oncology | Admitting: Radiation Oncology

## 2023-12-14 ENCOUNTER — Other Ambulatory Visit: Payer: Self-pay

## 2023-12-14 DIAGNOSIS — Z510 Encounter for antineoplastic radiation therapy: Secondary | ICD-10-CM | POA: Diagnosis not present

## 2023-12-14 LAB — RAD ONC ARIA SESSION SUMMARY
Course Elapsed Days: 19
Plan Fractions Treated to Date: 11
Plan Prescribed Dose Per Fraction: 2 Gy
Plan Total Fractions Prescribed: 20
Plan Total Prescribed Dose: 40 Gy
Reference Point Dosage Given to Date: 22 Gy
Reference Point Session Dosage Given: 2 Gy
Session Number: 11

## 2023-12-15 ENCOUNTER — Other Ambulatory Visit: Payer: Self-pay

## 2023-12-15 ENCOUNTER — Ambulatory Visit
Admission: RE | Admit: 2023-12-15 | Discharge: 2023-12-15 | Discharge status: Home / Self Care | Source: Ambulatory Visit | Attending: Radiation Oncology | Admitting: Radiation Oncology

## 2023-12-15 DIAGNOSIS — Z510 Encounter for antineoplastic radiation therapy: Secondary | ICD-10-CM | POA: Diagnosis not present

## 2023-12-15 LAB — RAD ONC ARIA SESSION SUMMARY
Course Elapsed Days: 20
Plan Fractions Treated to Date: 12
Plan Prescribed Dose Per Fraction: 2 Gy
Plan Total Fractions Prescribed: 20
Plan Total Prescribed Dose: 40 Gy
Reference Point Dosage Given to Date: 24 Gy
Reference Point Session Dosage Given: 2 Gy
Session Number: 12

## 2023-12-16 ENCOUNTER — Telehealth: Payer: Self-pay | Admitting: Pharmacy Technician

## 2023-12-16 ENCOUNTER — Other Ambulatory Visit (HOSPITAL_COMMUNITY): Payer: Self-pay

## 2023-12-16 ENCOUNTER — Ambulatory Visit
Admission: RE | Admit: 2023-12-16 | Discharge: 2023-12-16 | Disposition: A | Discharge status: Home / Self Care | Source: Ambulatory Visit | Attending: Radiation Oncology | Admitting: Radiation Oncology

## 2023-12-16 ENCOUNTER — Other Ambulatory Visit: Payer: Self-pay

## 2023-12-16 ENCOUNTER — Inpatient Hospital Stay

## 2023-12-16 DIAGNOSIS — Z510 Encounter for antineoplastic radiation therapy: Secondary | ICD-10-CM | POA: Diagnosis not present

## 2023-12-16 DIAGNOSIS — I871 Compression of vein: Secondary | ICD-10-CM

## 2023-12-16 LAB — RAD ONC ARIA SESSION SUMMARY
Course Elapsed Days: 21
Plan Fractions Treated to Date: 13
Plan Prescribed Dose Per Fraction: 2 Gy
Plan Total Fractions Prescribed: 20
Plan Total Prescribed Dose: 40 Gy
Reference Point Dosage Given to Date: 26 Gy
Reference Point Session Dosage Given: 2 Gy
Session Number: 13

## 2023-12-16 LAB — CBC (CANCER CENTER ONLY)
HCT: 34.9 % — ABNORMAL LOW (ref 36.0–46.0)
Hemoglobin: 11.6 g/dL — ABNORMAL LOW (ref 12.0–15.0)
MCH: 31.8 pg (ref 26.0–34.0)
MCHC: 33.2 g/dL (ref 30.0–36.0)
MCV: 95.6 fL (ref 80.0–100.0)
Platelet Count: 172 K/uL (ref 150–400)
RBC: 3.65 MIL/uL — ABNORMAL LOW (ref 3.87–5.11)
RDW: 14.7 % (ref 11.5–15.5)
WBC Count: 4.1 K/uL (ref 4.0–10.5)
nRBC: 0 % (ref 0.0–0.2)

## 2023-12-16 NOTE — Telephone Encounter (Signed)
 Oral Oncology Patient Advocate Encounter  Was successful in securing patient a $7500 grant from [facility] to provide copayment coverage for Kisqali .  This will keep the out of pocket expense at $0.     [employer] ID: [id number]   The billing information is as follows and has been shared with [facility].    RxBin: [id number] PCN: [id number] Member ID: [id number] Group ID: [id number] Dates of Eligibility: 11/16/2023 through 11/14/2024  Fund:  Breast Cancer - Medicare Access  Portia (Patty) Chet Burnet, CPhT  [name], Zelda Salmon, [facility] - Oral Chemotherapy Patient Advocate Specialist III Phone: [phone]  Fax: [phone]

## 2023-12-17 ENCOUNTER — Ambulatory Visit
Admission: RE | Admit: 2023-12-17 | Discharge: 2023-12-17 | Disposition: A | Discharge status: Home / Self Care | Source: Ambulatory Visit | Attending: Radiation Oncology | Admitting: Radiation Oncology

## 2023-12-17 ENCOUNTER — Inpatient Hospital Stay: Admitting: Hospice and Palliative Medicine

## 2023-12-17 ENCOUNTER — Telehealth: Payer: Self-pay | Admitting: *Deleted

## 2023-12-17 ENCOUNTER — Other Ambulatory Visit: Payer: Self-pay

## 2023-12-17 VITALS — BP 137/74 | HR 71 | Temp 98.2°F | Resp 18 | Ht 62.0 in | Wt 130.0 lb

## 2023-12-17 DIAGNOSIS — R600 Localized edema: Secondary | ICD-10-CM | POA: Diagnosis not present

## 2023-12-17 DIAGNOSIS — Z510 Encounter for antineoplastic radiation therapy: Secondary | ICD-10-CM | POA: Diagnosis not present

## 2023-12-17 LAB — RAD ONC ARIA SESSION SUMMARY
Course Elapsed Days: 22
Plan Fractions Treated to Date: 14
Plan Prescribed Dose Per Fraction: 2 Gy
Plan Total Fractions Prescribed: 20
Plan Total Prescribed Dose: 40 Gy
Reference Point Dosage Given to Date: 28 Gy
Reference Point Session Dosage Given: 2 Gy
Session Number: 14

## 2023-12-17 NOTE — Progress Notes (Signed)
 Symptom Management Clinic [facility] at [facility] [phone] [phone]  Patient Care Team: Marikay Eva POUR, PA as PCP - General (Physician Assistant) Rennie Cindy SAUNDERS, MD as Consulting Physician (Oncology)   NAME OF PATIENT: Dana Snow  [id number]  [birth year]   DATE OF VISIT: 12/17/23  REASON FOR CONSULT: Dana Snow is a 78 y.o. 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   INTERVAL HISTORY: Patient recently hospitalized with UTI and hyponatremia.  Patient saw Dr. Rennie earlier this week with complaint of bilateral upper extremity edema.  Was started on furosemide.  Patient presents to clinic today for evaluation of generalized edema.  States that she has had weeping arms.  Denies redness or pain.  No shortness of breath or chest pain.  No swelling to her face.  Initially, patient says that swelling in her arms has decreased overnight.  Denies any neurologic complaints. Denies recent fevers or illnesses. Denies any easy bleeding or bruising. Reports good appetite and denies weight loss. Denies chest pain. Denies any nausea, vomiting, constipation, or diarrhea. Denies urinary complaints. Patient offers no further specific complaints today.   PAST MEDICAL HISTORY: Past Medical History:  Diagnosis Date   Breast cancer (HCC) 2015   left lumpectomy 04/05/2014 and reexcision 04/27/2014 - radiation treatment   History of COVID-19    Hypertension    Personal history of radiation therapy 2016   Left breast    PAST SURGICAL HISTORY:  Past Surgical History:  Procedure Laterality Date   ABDOMINAL HYSTERECTOMY     BREAST BIOPSY Left 03/15/2014   8:00 us  bx, SCLEROSING ADENOSIS AND COLUMNAR CELL CHANGE    BREAST BIOPSY Left 02/27/2014   invasive lobular carcinoma LIQ   BREAST LUMPECTOMY Left 04/03/2014   [facility], DCIS, ALH, negative LN    BREAST LUMPECTOMY Left 04/27/2014   re-excision for clear margins   COMBINED HYSTEROSCOPY DIAGNOSTIC / D&C     PARTIAL MASTECTOMY WITH AXILLARY SENTINEL LYMPH NODE BIOPSY Left     HEMATOLOGY/ONCOLOGY HISTORY:  Oncology History Overview Note  #DEC 2015- LEFT  BREAST CANCER STAGE IIA [Mixed Ductal +Lobular Ca; T=2 (3.8CM); psN=0; Dr. Smith]]  ER/PR- >90%; Her 2 Nue- NEG; Lumpec & SLNBx Oncotype-RS=19 No Chemo; s/p RT [Dr.Crystal] April 2016- Arimidex; Stopped Oct 2016; START OCT 2016- Femara ; Sep 19th stopped sec to [place]; sep 19th 2019- START AROMASIN ; stop summer 2021.  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 PET scan- JULY 2025- Confluent abnormal soft tissue along the upper mediastinum. More towards the right side with involvement of the right lung hilum and separate tissue or nodes thoracic inlet on the right side adjacent to the right clavicle corresponding to the finding by prior CT scan. here are 4 hypermetabolic small bony lesions identified including thoracic spine at T1 and T2, the right acetabulum and left scapula worrisome for bony metastatic disease. A thoracic spine MRI may be of some benefit to further characterize the spine lesion. Two small nonspecific areas of asymmetric uptake along skeletal muscle of the left gluteus medius muscle and the left pectoralis muscle.   NGS-pending.   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   # BMD- Osteopenia [may 2016]; AUG 2018- T score= -1.1 [improved] ------------------------------------------------------------  DIAGNOSIS: BREAST CANCER    CURRENT/MOST RECENT THERAPY: Surveillance.    Carcinoma of lower-inner quadrant of left breast in female, estrogen receptor positive (HCC)  04/12/2020 Cancer Staging   Staging form:  Breast, AJCC 8th Edition - Clinical: Stage IB (cT2, cN0, cM0, G2, ER+, PR+, HER2-) - Signed by Rennie Cindy SAUNDERS, MD on 04/12/2020     ALLERGIES:  is allergic to acetaminophen , codeine, codone [hydrocodone], oxycodone hcl, percocet [oxycodone-acetaminophen ], and penicillins.  MEDICATIONS:  Current Outpatient Medications  Medication Sig Dispense Refill   Calcium-Vitamin D  600-200 MG-UNIT per tablet Take 1 tablet by mouth daily.     dexamethasone  (DECADRON ) 2 MG tablet Take 1 pill twice a day.  Take the second pill early in the evening.  Take with food. 14 tablet 0   furosemide (LASIX) 20 MG tablet Take 20 mg by mouth daily.     letrozole  (FEMARA ) 2.5 MG tablet Take 1 tablet (2.5 mg total) by mouth daily. 90 tablet 1   magic mouthwash (nystatin, hydrocortisone, diphenhydrAMINE, lidocaine ) suspension 5 mLs 4 (four) times daily as needed for mouth pain.     metoprolol  tartrate (LOPRESSOR ) 25 MG tablet Take 25 mg by mouth 2 (two) times daily.     ondansetron  (ZOFRAN ) 8 MG tablet One pill every 8 hours as needed for nausea/vomitting. 40 tablet 1   polyethylene glycol powder (MIRALAX ) 17 GM/SCOOP powder Take 17 g by mouth daily. Dissolve 1 capful (17g) in 4-8 ounces of liquid and take by mouth daily. 238 g 3   vitamin C (ASCORBIC ACID) 500 MG tablet Take 500 mg by mouth daily.     ciprofloxacin  (CIPRO ) 250 MG tablet Take 1 tablet (250 mg total) by mouth 2 (two) times daily. 6 tablet 0   [Paused] ribociclib  succ (KISQALI  400MG  DAILY DOSE) 200 MG Therapy Pack Take 2 tablets (400 mg total) by mouth daily. Take for 21 days on, 7 days off, repeat every 28 days. (Patient not taking: Reported on 12/17/2023) 42 tablet 0   [Paused] torsemide  (DEMADEX ) 20 MG tablet Take 20 mg by mouth in the morning, at noon, in the evening, and at bedtime. (Patient not taking: Reported on 12/17/2023)     No current facility-administered medications for this visit.    VITAL SIGNS: Ht 5' 2 (1.575 m)   Wt 130 lb (59 kg)    BMI 23.78 kg/m  Filed Weights   12/17/23 1104  Weight: 130 lb (59 kg)    Estimated body mass index is 23.78 kg/m as calculated from the following:   Height as of this encounter: 5' 2 (1.575 m).   Weight as of this encounter: 130 lb (59 kg).  LABS: CBC:    Component Value Date/Time   WBC 4.1 12/16/2023 1537   WBC 5.2 12/10/2023 0520   HGB 11.6 (L) 12/16/2023 1537   HGB 14.8 07/17/2014 1005   HCT 34.9 (L) 12/16/2023 1537   HCT 44.1 07/17/2014 1005   PLT 172 12/16/2023 1537   PLT 227 07/17/2014 1005   MCV 95.6 12/16/2023 1537   MCV 92 07/17/2014 1005   NEUTROABS 3.2 12/13/2023 1036   NEUTROABS 2.8 07/17/2014 1005   LYMPHSABS 0.3 (L) 12/13/2023 1036   LYMPHSABS 1.3 07/17/2014 1005   MONOABS 0.2 12/13/2023 1036   MONOABS 0.5 07/17/2014 1005   EOSABS 0.0 12/13/2023 1036   EOSABS 0.3 07/17/2014 1005   BASOSABS 0.0 12/13/2023 1036   BASOSABS 0.1 07/17/2014 1005   Comprehensive Metabolic Panel:    Component Value Date/Time  NA 128 (L) 12/13/2023 1036   K 3.9 12/13/2023 1036   K 3.9 03/29/2014 1402   CL 96 (L) 12/13/2023 1036   CO2 22 12/13/2023 1036   BUN 14 12/13/2023 1036   CREATININE 0.81 12/13/2023 1036   CREATININE 0.74 07/17/2014 1005   GLUCOSE 103 (H) 12/13/2023 1036   CALCIUM 9.1 12/13/2023 1036   AST 26 12/13/2023 1036   ALT 34 12/13/2023 1036   ALT 16 07/17/2014 1005   ALKPHOS 53 12/13/2023 1036   ALKPHOS 70 07/17/2014 1005   BILITOT 0.9 12/13/2023 1036   PROT 6.7 12/13/2023 1036   PROT 7.4 07/17/2014 1005   ALBUMIN 3.8 12/13/2023 1036   ALBUMIN 4.4 07/17/2014 1005    RADIOGRAPHIC STUDIES: CT ABDOMEN PELVIS W CONTRAST Result Date: 12/10/2023 EXAM: CT ABDOMEN AND PELVIS WITH CONTRAST 12/10/2023 11:34:43 AM TECHNIQUE: CT of the abdomen and pelvis was performed with the administration of intravenous contrast. Multiplanar reformatted images are provided for review. Automated exposure control, iterative reconstruction, and/or weight-based adjustment of  the mA/kV was utilized to reduce the radiation dose to as low as reasonably achievable. COMPARISON: None available. CLINICAL HISTORY: Urinary retention, metastatic breast cancer. FINDINGS: LOWER CHEST: Small bilateral pleural effusions are seen at the visualized lung bases, right greater than left. Progressive collapse and consolidation within the visualized right middle lobe. Small pericardial effusion. There is extensive coronary artery calcification, largely within the left anterior descending coronary artery. Cardiac size is within normal limits. LIVER: Mild hepatic steatosis. No focal intrahepatic mass identified GALLBLADDER AND BILE DUCTS: Gallbladder is unremarkable. No biliary ductal dilatation. SPLEEN: No acute abnormality. PANCREAS: No acute abnormality. ADRENAL GLANDS: No acute abnormality. KIDNEYS, URETERS AND BLADDER: The bladder is decompressed; however, there is circumferential bladder wall thickening and perivesicular inflammatory stranding, which may reflect superimposed infectious or inflammatory cystitis. No perivesicular fluid collections are identified. GI AND BOWEL: There is moderate sigmoid diverticulosis. There is moderate stool within the rectal vault with superimposed rectal wall thickening and perirectal inflammatory stranding and mild presacral edema, which may reflect changes of a superimposed proctitis, possibly circle proctitis. There is no evidence of obstruction. PERITONEUM AND RETROPERITONEUM: No ascites. No free air. VASCULATURE: Aorta is normal in caliber. Mild aortoiliac atherosclerotic calcification. LYMPH NODES: No lymphadenopathy. REPRODUCTIVE ORGANS: Status post hysterectomy. No adnexal masses. BONES AND SOFT TISSUES: The osseous structures are age appropriate. No focal lytic or blastic bone lesion. IMPRESSION: 1. Circumferential bladder wall thickening and perivesicular inflammatory stranding, possibly reflecting superimposed infectious or inflammatory cystitis. No  perivesicular fluid collections identified. 2. Moderate sigmoid diverticulosis without evidence of diverticulitis. 3. Rectal wall thickening with perirectal inflammatory stranding and mild presacral edema, possibly reflecting superimposed proctitis. No evidence of obstruction or perforation. 4. No evidence of metastatic disease within abdomen and pelvis Electronically signed by: Dorethia Molt MD 12/10/2023 11:57 AM EDT RP Workstation: [id number]   US  CORE BIOPSY (LYMPH NODES) Result Date: 11/18/2023 INDICATION: Right supraclavicular soft tissue mass and infiltrative tumor in the mediastinum causing SVC syndrome. History of breast carcinoma with presumed recurrence. EXAM: ULTRASOUND GUIDED CORE BIOPSY OF RIGHT SUPRACLAVICULAR SOFT TISSUE MASS MEDICATIONS: None. ANESTHESIA/SEDATION: None PROCEDURE: The procedure, risks, benefits, and alternatives were explained to the patient. Questions regarding the procedure were encouraged and answered. The patient understands and consents to the procedure. A time out was performed prior to initiating the procedure. The right neck was prepped with chlorhexidine in a sterile fashion, and a sterile drape was applied covering the operative field. A sterile gown and sterile gloves were  used for the procedure. Local anesthesia was provided with 1% Lidocaine . Ultrasound was used to localize abnormal soft tissue in the right neck. Under ultrasound guidance, a 17 gauge trocar needle was advanced to the margin of the mass. Coaxial 18 gauge core biopsy samples were obtained with 4 passes made. Material was placed on saline soaked Telfa gauze. Additional ultrasound was performed. COMPLICATIONS: None immediate. FINDINGS: At first it was very difficult to localize a discrete soft tissue mass in the right supraclavicular region. Utilizing a chest CT performed yesterday for landmarks, abnormal vaguely marginated hypoechoic soft tissue was identified lying lateral to the right common carotid  artery and corresponding in location to the CT abnormality. This measures approximately 3.4 cm in transverse diameter which corresponds well in size to the CT scan. The tissue was very firm by biopsy and yielded 1 completely intact and 3 partially fragmented core biopsy samples. IMPRESSION: Ultrasound-guided core biopsy of right supraclavicular mass seen as hypoechoic soft tissue by ultrasound measuring up to 3.4 cm in estimated diameter. Core biopsy yielded solid and fragmented tissue. Electronically Signed   By: Marcey Moan M.D.   On: 11/18/2023 16:22   CT VENOGRAM CHEST Result Date: 11/17/2023 CLINICAL DATA:  SVC syndrome. History of breast cancer and mediastinal mass. EXAM: CT VENORAPHY CHEST WITH CONTRAST TECHNIQUE: Multidetector CT imaging of the chest was performed using the standard protocol during bolus administration of intravenous contrast. Multiplanar CT image reconstructions and MIPs were obtained to evaluate the vascular anatomy. RADIATION DOSE REDUCTION: This exam was performed according to the departmental dose-optimization program which includes automated exposure control, adjustment of the mA and/or kV according to patient size and/or use of iterative reconstruction technique. CONTRAST:  OMNIPAQUE  IOHEXOL  350 MG/ML SOLN COMPARISON:  Chest CT dated 08/15/2023. FINDINGS: Cardiovascular: There is no cardiomegaly. There is 3 vessel coronary vascular calcification. Moderate pericardial effusion increased in size since the prior CT measuring up to 17 mm in thickness. Mild atherosclerotic calcification of the thoracic aorta. No aneurysmal dilatation or dissection. The origins of the great vessels of the aortic arch and the central pulmonary arteries are patent. Apparent filling defects within the central vessels in the right hilum (coronal series 5 images 52 and 53) are suboptimally evaluated but may be within the pulmonary artery branches and represent nonocclusive PEs. Alternatively these  may be extension of infiltrative tissue and tumor thrombus within the pulmonary vasculature. Mediastinum/Nodes: There is infiltrative tissue in the upper mediastinum encasing the roots of the branches of the great vessels of the aortic arch, left innominate vein, and the SVC. Interval increase in the size of the mediastinal tissue since the prior CT. For example a component of the tissue encasing the origin of the left common carotid artery measures approximately 3.8 x 3.8 cm (previously 2.7 x 3.4 cm). There is high-grade narrowing of the central SVC involving approximately 5 cm length of the vessel. There is also narrowing of the innominate vein. Infiltrating tissue abuts the anterior trachea and appears to encase the bifurcation of the trachea. There is encasement of the proximal right subclavian artery with mild focal narrowing of this vessel. The origins of the great vessels of the aortic arch however remain patent. Several rounded small mediastinal lymph nodes noted. There is a 12 x 16 mm low attenuating nodule in the right hilum which may represent infiltrative mass or adenopathy. There is mild infiltration of the fat plane along the anterior esophagus. Lungs/Pleura: Background of emphysema. Small right pleural effusion, new since the  prior CT. There is partial compressive atelectasis of the right lower lobe. Pneumonia is not excluded. There is no pneumothorax. The central airways remain patent. Upper Abdomen: No acute abnormality. Musculoskeletal: Small lucent lesion in the posterior aspect of the T2 suspicious for metastatic disease. No acute osseous pathology. Review of the MIP images confirms the above findings. IMPRESSION: 1. Interval increase in the size of the infiltrative tissue in the upper mediastinum encasing the roots of the great vessels of the aortic arch, left innominate vein, and the SVC. There is high-grade narrowing of the central SVC involving approximately 5 cm length of the vessel. There  is also narrowing of the innominate vein. 2. Apparent filling defects within the central vessels in the right hilum are suboptimally evaluated but may be within the pulmonary artery branches and represent nonocclusive PEs. Alternatively these may be extension of infiltrative tissue and tumor thrombus within the pulmonary vasculature. 3. Small right pleural effusion with partial compressive atelectasis of the right lower lobe. Pneumonia is not excluded. 4. Moderate pericardial effusion increased in size since the prior CT. 5. Small lucent lesion in the posterior aspect of the T2 suspicious for metastatic disease. 6. Aortic Atherosclerosis (ICD10-I70.0) and Emphysema (ICD10-J43.9). These results will be called to the ordering clinician or representative by the Radiologist Assistant, and communication documented in the PACS or [employer]. Electronically Signed   By: Vanetta Chou M.D.   On: 11/17/2023 18:13    PERFORMANCE STATUS (ECOG) : 2 - Symptomatic, <50% confined to bed  Review of Systems Unless otherwise noted, a complete review of systems is negative.  Physical Exam General: NAD Cardiovascular: regular rate and rhythm Pulmonary: clear anterior/posterior fields Abdomen: soft, nontender, + bowel sounds GU: no suprapubic tenderness Extremities: BUE/BLE edema, no joint deformities Skin: no rashes Neurological: Weakness but otherwise nonfocal  IMPRESSION/PLAN: Stage IV breast cancer -receiving XRT to mediastinal mass.  On treatment with Ribociclib  plus letrozole .  Upper extremity edema -patient report swelling is improving after initiation of furosemide.  Can continue low-dose diuretics.  Follow-up labs next week.  Will obtain bilateral upper extremity Dopplers and refer to [facility] for possible lymphedema management  Case and plan discussed with Dr. Rennie  Patient expressed understanding and was in agreement with this plan. She also understands that She can call clinic at any  time with any questions, concerns, or complaints.   Thank you for allowing me to participate in the care of this very pleasant patient.   Time Total: 15 minutes  Visit consisted of counseling and education dealing with the complex and emotionally intense issues of symptom management in the setting of serious illness.Greater than 50%  of this time was spent counseling and coordinating care related to the above assessment and plan.  Signed by: Fonda Mower, PhD, NP-C

## 2023-12-17 NOTE — Telephone Encounter (Signed)
 Patient is coming over today for radiation and we have asked them to call them at 11 AM and then after that to come upstairs for 11:30 and see orders so that he can see the swelling and the oozing of the right arm.  I called the patient and told her to come 11:00 and I also asked for that to be done with the radiation to be moving up and they were okay with this to

## 2023-12-20 ENCOUNTER — Other Ambulatory Visit: Payer: Self-pay

## 2023-12-20 ENCOUNTER — Encounter: Payer: Self-pay | Admitting: Internal Medicine

## 2023-12-20 ENCOUNTER — Ambulatory Visit: Admission: RE | Admit: 2023-12-20 | Source: Ambulatory Visit

## 2023-12-20 ENCOUNTER — Ambulatory Visit
Admission: RE | Admit: 2023-12-20 | Discharge: 2023-12-20 | Disposition: A | Discharge status: Home / Self Care | Source: Ambulatory Visit | Attending: Radiation Oncology | Admitting: Radiation Oncology

## 2023-12-20 ENCOUNTER — Inpatient Hospital Stay (HOSPITAL_BASED_OUTPATIENT_CLINIC_OR_DEPARTMENT_OTHER): Admitting: Internal Medicine

## 2023-12-20 ENCOUNTER — Inpatient Hospital Stay

## 2023-12-20 VITALS — BP 152/76 | HR 71 | Temp 97.0°F | Resp 17 | Ht 62.0 in

## 2023-12-20 DIAGNOSIS — Z170 Estrogen receptor positive status [ER+]: Secondary | ICD-10-CM

## 2023-12-20 DIAGNOSIS — Z510 Encounter for antineoplastic radiation therapy: Secondary | ICD-10-CM | POA: Diagnosis not present

## 2023-12-20 DIAGNOSIS — C50312 Malignant neoplasm of lower-inner quadrant of left female breast: Secondary | ICD-10-CM

## 2023-12-20 LAB — CBC WITH DIFFERENTIAL (CANCER CENTER ONLY)
Abs Immature Granulocytes: 0.03 K/uL (ref 0.00–0.07)
Basophils Absolute: 0 K/uL (ref 0.0–0.1)
Basophils Relative: 0 %
Eosinophils Absolute: 0 K/uL (ref 0.0–0.5)
Eosinophils Relative: 0 %
HCT: 36.8 % (ref 36.0–46.0)
Hemoglobin: 12.4 g/dL (ref 12.0–15.0)
Immature Granulocytes: 1 %
Lymphocytes Relative: 4 %
Lymphs Abs: 0.3 K/uL — ABNORMAL LOW (ref 0.7–4.0)
MCH: 32.6 pg (ref 26.0–34.0)
MCHC: 33.7 g/dL (ref 30.0–36.0)
MCV: 96.8 fL (ref 80.0–100.0)
Monocytes Absolute: 0.4 K/uL (ref 0.1–1.0)
Monocytes Relative: 5 %
Neutro Abs: 6 K/uL (ref 1.7–7.7)
Neutrophils Relative %: 90 %
Platelet Count: 186 K/uL (ref 150–400)
RBC: 3.8 MIL/uL — ABNORMAL LOW (ref 3.87–5.11)
RDW: 15.9 % — ABNORMAL HIGH (ref 11.5–15.5)
WBC Count: 6.6 K/uL (ref 4.0–10.5)
nRBC: 0 % (ref 0.0–0.2)

## 2023-12-20 LAB — RAD ONC ARIA SESSION SUMMARY
Course Elapsed Days: 25
Plan Fractions Treated to Date: 15
Plan Prescribed Dose Per Fraction: 2 Gy
Plan Total Fractions Prescribed: 20
Plan Total Prescribed Dose: 40 Gy
Reference Point Dosage Given to Date: 30 Gy
Reference Point Session Dosage Given: 2 Gy
Session Number: 15

## 2023-12-20 LAB — BASIC METABOLIC PANEL - CANCER CENTER ONLY
Anion gap: 9 (ref 5–15)
BUN: 27 mg/dL — ABNORMAL HIGH (ref 8–23)
CO2: 25 mmol/L (ref 22–32)
Calcium: 9 mg/dL (ref 8.9–10.3)
Chloride: 96 mmol/L — ABNORMAL LOW (ref 98–111)
Creatinine: 0.79 mg/dL (ref 0.44–1.00)
GFR, Estimated: >60 mL/min (ref >60)
Glucose, Bld: 130 mg/dL — ABNORMAL HIGH (ref 70–99)
Potassium: 3.9 mmol/L (ref 3.5–5.1)
Sodium: 130 mmol/L — ABNORMAL LOW (ref 135–145)

## 2023-12-20 LAB — MAGNESIUM: Magnesium: 2.4 mg/dL (ref 1.7–2.4)

## 2023-12-20 NOTE — Progress Notes (Signed)
 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 Smith]]  ER/PR- >90%; Her 2 Nue- NEG; Lumpec & SLNBx Oncotype-RS=19 No Chemo; s/p RT [Dr.Crystal] April 2016- Arimidex; Stopped Oct 2016; START OCT 2016- Femara ; Sep 19th stopped sec to [employer]; sep 19th 2019- START AROMASIN ; stop summer 2021.  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 PET scan- JULY 2025- Confluent abnormal soft tissue along the upper mediastinum. More towards the right side with involvement of the right lung hilum and separate tissue or nodes thoracic inlet on the right side adjacent to the right clavicle corresponding to the finding by prior CT scan. here are 4 hypermetabolic small bony lesions identified including thoracic spine at T1 and T2, the right acetabulum and left scapula worrisome for bony metastatic disease. A thoracic spine MRI may be of some benefit to further characterize the spine lesion. Two small nonspecific areas of asymmetric uptake along skeletal muscle of the left gluteus medius muscle and the left pectoralis muscle.   NGS-pending.   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   # BMD- Osteopenia [may 2016]; AUG 2018- T score= -1.1 [improved] ------------------------------------------------------------   DIAGNOSIS: BREAST CANCER    CURRENT/MOST RECENT THERAPY: Surveillance.    Carcinoma of lower-inner quadrant of left breast in female, estrogen receptor positive  (HCC)  04/12/2020 Cancer Staging   Staging form: Breast, AJCC 8th Edition - Clinical: Stage IB (cT2, cN0, cM0, G2, ER+, PR+, HER2-) - Signed by Maretta Overdorf R, MD on 04/12/2020     HISTORY OF PRESENTING ILLNESS: Patient ambulating-independently. Accompanied by husband.  Dana Snow 78 y.o.  female pleasant patient with recurrent breast cancer-stage IV-ER pos;her 2 NEG- with SVC syndrome-currently on radiation and also Ribociclib  plus letrozole  is here for follow-up.   Patient here for  follow-up appointment, concerns of SOB and oozing edema spacing L hand. Kerlex applied.  Overall improved-but not back to baseline.  Status post recent evaluation with [facility].  Has not had Dopplers as ordered.  She is currently awaiting evaluation with [facility]  No further hospitalizations.  Denies any fevers or chills.     Review of Systems  Constitutional:  Positive for malaise/fatigue and weight loss. Negative for chills, diaphoresis and fever.  HENT:  Negative for nosebleeds and sore throat.   Eyes:  Negative for double vision.  Respiratory:  Positive for cough and shortness of breath. Negative for hemoptysis, sputum production and wheezing.   Cardiovascular:  Negative for chest pain, palpitations, orthopnea and leg swelling.  Gastrointestinal:  Negative for abdominal pain, blood in stool, constipation, diarrhea, heartburn, melena, nausea and vomiting.  Genitourinary:  Negative for dysuria, frequency and urgency.  Musculoskeletal:  Negative for back pain and joint pain.  Skin: Negative.  Negative for itching and rash.  Neurological:  Positive for weakness. Negative for dizziness, tingling, focal weakness and headaches.  Endo/Heme/Allergies:  Does not bruise/bleed easily.  Psychiatric/Behavioral:  Negative for depression. The patient is not nervous/anxious and does not have insomnia.     MEDICAL HISTORY:  Past Medical History:  Diagnosis Date   Breast cancer (HCC) 2015   left lumpectomy  04/05/2014 and reexcision 04/27/2014 - radiation treatment   History of COVID-19    Hypertension    Personal history of radiation therapy 2016   Left breast    SURGICAL HISTORY: Past Surgical History:  Procedure Laterality Date   ABDOMINAL HYSTERECTOMY     BREAST BIOPSY Left 03/15/2014   8:00 us  bx, SCLEROSING ADENOSIS AND COLUMNAR CELL CHANGE    BREAST BIOPSY Left 02/27/2014   invasive lobular carcinoma LIQ   BREAST LUMPECTOMY Left 04/03/2014   [facility], DCIS, AL[facility], negative LN   BREAST LUMPECTOMY Left 04/27/2014   re-excision for clear margins   COMBINED HYSTEROSCOPY DIAGNOSTIC / D&C     PARTIAL MASTECTOMY WITH AXILLARY SENTINEL LYMPH NODE BIOPSY Left     SOCIAL HISTORY: Social History   Socioeconomic History   Marital status: Married    Spouse name: Not on file   Number of children: Not on file   Years of education: Not on file   Highest education level: Not on file  Occupational History   Not on file  Tobacco Use   Smoking status: Former    Current packs/day: 1.50    Types: Cigarettes   Smokeless tobacco: Never  Vaping Use   Vaping status: Never Used  Substance and Sexual Activity   Alcohol use: Yes    Alcohol/week: 2.0 standard drinks of alcohol    Types: 2 Glasses of wine per week   Drug use: Never   Sexual activity: Never  Other Topics Concern   Not on file  Social History Narrative   Not on file   Social Drivers of Health   Financial Resource Strain: Low Risk  (03/11/2023)   Received [facility]   Overall Financial Resource Strain (CARDIA)    Difficulty of Paying Living Expenses: Not hard at all  Food Insecurity: No Food Insecurity (12/09/2023)   Hunger Vital Sign    Worried About Running Out of Food in the Last Year: Never true    Ran Out of Food in the Last Year: Never true  Transportation Needs: No Transportation Needs (12/09/2023)   PRAPARE - Administrator, Civil Service (Medical): No    Lack of Transportation  (Non-Medical): No  Physical Activity: Not on file  Stress: Not on file  Social Connections: Socially Integrated (12/09/2023)   Social Connection and Isolation Panel    Frequency of Communication with Friends and Family: Three times a week    Frequency of Social Gatherings with Friends and Family: Three times a week    Attends Religious Services: More than 4 times per year    Active Member of Clubs or Organizations: Yes    Attends Banker Meetings: More than 4 times per year    Marital Status: Married  Catering manager Violence: Not At Risk (12/09/2023)   Humiliation, Afraid, Rape, and Kick questionnaire    Fear of Current or Ex-Partner: No    Emotionally Abused: No    Physically Abused: No    Sexually Abused: No    FAMILY HISTORY: Family History  Problem Relation Age of Onset   Breast cancer Neg Hx     ALLERGIES:  is allergic to acetaminophen , codeine, codone [hydrocodone], oxycodone hcl, percocet [oxycodone-acetaminophen ], and penicillins.  MEDICATIONS:  Current Outpatient Medications  Medication Sig Dispense Refill   Calcium-Vitamin D  600-200 MG-UNIT per tablet Take 1 tablet by mouth daily.  ciprofloxacin  (CIPRO ) 250 MG tablet Take 1 tablet (250 mg total) by mouth 2 (two) times daily. 6 tablet 0   dexamethasone  (DECADRON ) 2 MG tablet Take 1 pill twice a day.  Take the second pill early in the evening.  Take with food. 14 tablet 0   furosemide (LASIX) 20 MG tablet Take 20 mg by mouth daily.     letrozole  (FEMARA ) 2.5 MG tablet Take 1 tablet (2.5 mg total) by mouth daily. 90 tablet 1   magic mouthwash (nystatin, hydrocortisone, diphenhydrAMINE, lidocaine ) suspension 5 mLs 4 (four) times daily as needed for mouth pain.     metoprolol  tartrate (LOPRESSOR ) 25 MG tablet Take 25 mg by mouth 2 (two) times daily.     ondansetron  (ZOFRAN ) 8 MG tablet One pill every 8 hours as needed for nausea/vomitting. 40 tablet 1   polyethylene glycol powder (MIRALAX ) 17 GM/SCOOP powder  Take 17 g by mouth daily. Dissolve 1 capful (17g) in 4-8 ounces of liquid and take by mouth daily. 238 g 3   [Paused] ribociclib  succ (KISQALI  400MG  DAILY DOSE) 200 MG Therapy Pack Take 2 tablets (400 mg total) by mouth daily. Take for 21 days on, 7 days off, repeat every 28 days. 42 tablet 0   vitamin C (ASCORBIC ACID) 500 MG tablet Take 500 mg by mouth daily.     [Paused] torsemide  (DEMADEX ) 20 MG tablet Take 20 mg by mouth in the morning, at noon, in the evening, and at bedtime. (Patient not taking: Reported on 12/20/2023)     No current facility-administered medications for this visit.    PHYSICAL EXAMINATION:   Vitals:   12/20/23 1333 12/20/23 1341  BP: (!) 173/94 (!) 152/76  Pulse: 71   Resp: 17   Temp: (!) 97 F (36.1 C)   SpO2: 95%    There were no vitals filed for this visit.  Facial puffiness noted.  Right pupil smaller- and drooping noted.   Venous engorgement on the chest noted.   Bilateral upper extremity swelling noted.  Physical Exam Vitals and nursing note reviewed.  HENT:     Head: Normocephalic and atraumatic.     Mouth/Throat:     Pharynx: Oropharynx is clear.  Eyes:     Extraocular Movements: Extraocular movements intact.     Pupils: Pupils are equal, round, and reactive to light.  Cardiovascular:     Rate and Rhythm: Normal rate and regular rhythm.  Pulmonary:     Comments: Decreased breath sounds bilaterally.  Abdominal:     Palpations: Abdomen is soft.  Musculoskeletal:        General: Normal range of motion.     Cervical back: Normal range of motion.  Skin:    General: Skin is warm.  Neurological:     General: No focal deficit present.     Mental Status: She is alert and oriented to person, place, and time.  Psychiatric:        Behavior: Behavior normal.        Judgment: Judgment normal.     LABORATORY DATA:  I have reviewed the data as listed Lab Results  Component Value Date   WBC 6.6 12/20/2023   HGB 12.4 12/20/2023   HCT 36.8  12/20/2023   MCV 96.8 12/20/2023   PLT 186 12/20/2023   Recent Labs    12/09/23 0946 12/09/23 1354 12/10/23 0520 12/11/23 0445 12/13/23 1036 12/20/23 1305  NA 124*  --  127* 128* 128* 130*  K 4.2  --  4.4 5.1 3.9 3.9  CL 86*  --  96* 98 96* 96*  CO2 26  --  24 25 22 25   GLUCOSE 123*  --  105* 109* 103* 130*  BUN 22  --  14 11 14  27*  CREATININE 1.09*   < > 0.74 0.93 0.81 0.79  CALCIUM 9.4  --  9.0 9.2 9.1 9.0  GFRNONAA 52*   < > >60 >60 >60 >60  PROT 7.2  --  6.4*  --  6.7  --   ALBUMIN 3.9  --  3.4*  --  3.8  --   AST 23  --  25  --  26  --   ALT 27  --  27  --  34  --   ALKPHOS 56  --  46  --  53  --   BILITOT 1.6*  --  1.1  --  0.9  --    < > = values in this interval not displayed.    RADIOGRAPHIC STUDIES: I have personally reviewed the radiological images as listed and agreed with the findings in the report. CT ABDOMEN PELVIS W CONTRAST Result Date: 12/10/2023 EXAM: CT ABDOMEN AND PELVIS WITH CONTRAST 12/10/2023 11:34:43 AM TECHNIQUE: CT of the abdomen and pelvis was performed with the administration of intravenous contrast. Multiplanar reformatted images are provided for review. Automated exposure control, iterative reconstruction, and/or weight-based adjustment of the mA/kV was utilized to reduce the radiation dose to as low as reasonably achievable. COMPARISON: None available. CLINICAL HISTORY: Urinary retention, metastatic breast cancer. FINDINGS: LOWER CHEST: Small bilateral pleural effusions are seen at the visualized lung bases, right greater than left. Progressive collapse and consolidation within the visualized right middle lobe. Small pericardial effusion. There is extensive coronary artery calcification, largely within the left anterior descending coronary artery. Cardiac size is within normal limits. LIVER: Mild hepatic steatosis. No focal intrahepatic mass identified GALLBLADDER AND BILE DUCTS: Gallbladder is unremarkable. No biliary ductal dilatation. SPLEEN: No  acute abnormality. PANCREAS: No acute abnormality. ADRENAL GLANDS: No acute abnormality. KIDNEYS, URETERS AND BLADDER: The bladder is decompressed; however, there is circumferential bladder wall thickening and perivesicular inflammatory stranding, which may reflect superimposed infectious or inflammatory cystitis. No perivesicular fluid collections are identified. GI AND BOWEL: There is moderate sigmoid diverticulosis. There is moderate stool within the rectal vault with superimposed rectal wall thickening and perirectal inflammatory stranding and mild presacral edema, which may reflect changes of a superimposed proctitis, possibly circle proctitis. There is no evidence of obstruction. PERITONEUM AND RETROPERITONEUM: No ascites. No free air. VASCULATURE: Aorta is normal in caliber. Mild aortoiliac atherosclerotic calcification. LYMPH NODES: No lymphadenopathy. REPRODUCTIVE ORGANS: Status post hysterectomy. No adnexal masses. BONES AND SOFT TISSUES: The osseous structures are age appropriate. No focal lytic or blastic bone lesion. IMPRESSION: 1. Circumferential bladder wall thickening and perivesicular inflammatory stranding, possibly reflecting superimposed infectious or inflammatory cystitis. No perivesicular fluid collections identified. 2. Moderate sigmoid diverticulosis without evidence of diverticulitis. 3. Rectal wall thickening with perirectal inflammatory stranding and mild presacral edema, possibly reflecting superimposed proctitis. No evidence of obstruction or perforation. 4. 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 PET scan- JULY  2025- Confluent abnormal soft tissue along the upper mediastinum. More towards the right side with involvement of the right lung hilum and separate tissue or nodes thoracic inlet on the right side adjacent to the right clavicle corresponding to the finding by prior CT scan. here are 4 hypermetabolic small bony lesions identified including thoracic spine at T1 and T2, the right acetabulum and left scapula worrisome for bony metastatic disease. A thoracic spine MRI may be of some benefit to further characterize the spine lesion. Two small nonspecific areas of asymmetric uptake along skeletal muscle of the left gluteus medius muscle and the left pectoralis muscle.  Awaiting on NGS. SEP 2025-  Currently on on  letrozole  + Ribociclib .  # Continue  ribociclib  [ lower dose-400 mg 3 weeks on 1 week off-]- continue letrzole.   Consider Zometa down the line. Will repeat imaging in NOV 2025/ after 3 months of treatment or if getting worse.   # SVC syndrome-/ anisocoria- radiation esophagitis-s/p Magic mouthwash- improved- RT until 9/29.  S/p dexamethasone  2mg /day x7 days. Blurry vision-if not improved defer to Ophthalmology, Dr.Portofillo. Bil UE swelling- reluctant with dopplers- Low clinical concerns for DVT- follow up with [facility].  Discussed slow improvement over time-over the next few months.  However if symptomatic leak getting worse consider re-imaging/Chest venogram-and then consideration of stent with Dr. Marea.  # SEP 2025- [facility]- ENTEROCOCCUS FAECALIS - s/p amoxicillin - improved.   # hyponatremia-multifactorial-sodium in the 130 recently- stable.    # OSTEOPENIA- [BMD= T score- 1.4]; continue ca+ vit D; exercise; monitor closely on therapy   # IV accesss: difficult with larger needles -patient reluctant with port.  However will discuss with Dr. Marea.  # DISPOSITION: # follow up in 3 week- APP: labs- cbc/ mag- Ca 27-29 # follow up in 7 weeks- MD; labs- cbc/cmp;Ca 27-29- mag Dr.B     Above plan of  care was discussed with patient/family in detail.  My contact information was given to the patient/family.     Cindy JONELLE Joe, MD 12/20/2023 2:51 PM

## 2023-12-20 NOTE — Progress Notes (Signed)
 Patient here for  follow-up appointment, concerns of SOB and oozing edema spacing L hand. Kerlex applied.

## 2023-12-20 NOTE — Assessment & Plan Note (Addendum)
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 PET scan- JULY 2025- Confluent abnormal soft tissue along the upper mediastinum. More towards the right side with involvement of the right lung hilum and separate tissue or nodes thoracic inlet on the right side adjacent to the right clavicle corresponding to the finding by prior CT scan. here are 4 hypermetabolic small bony lesions identified including thoracic spine at T1 and T2, the right acetabulum and left scapula worrisome for bony metastatic disease. A thoracic spine MRI may be of some benefit to further characterize the spine lesion. Two small nonspecific areas of asymmetric uptake along skeletal muscle of the left gluteus medius muscle and the left pectoralis muscle.  Awaiting on NGS. SEP 2025-  Currently on on  letrozole  + Ribociclib .  # Continue  ribociclib  [ lower dose-400 mg 3 weeks on 1 week off-]- continue letrzole.   Consider Zometa down the line. Will repeat imaging in NOV 2025/ after 3 months of treatment or if getting worse.   # SVC syndrome-/ anisocoria- radiation esophagitis-s/p Magic mouthwash- improved- RT until 9/29.  S/p dexamethasone  2mg /day x7 days. Blurry vision-if not improved defer to Ophthalmology, Dr.Portofillo. Bil UE swelling- reluctant with dopplers- Low clinical concerns for DVT- follow up with [facility].  Discussed slow improvement over time-over the next few months.  However if symptomatic leak getting worse consider re-imaging/Chest venogram-and then consideration of stent with Dr. Marea.  # SEP 2025- [facility]- ENTEROCOCCUS FAECALIS - s/p amoxicillin - improved.   # hyponatremia-multifactorial-sodium in the 130 recently- stable.    # OSTEOPENIA- [BMD= T score- 1.4]; continue ca+ vit D; exercise; monitor closely on therapy   # IV accesss: difficult with larger needles -patient reluctant with port.  However will  discuss with Dr. Marea.  # DISPOSITION: # follow up in 3 week- APP: labs- cbc/ mag- Ca 27-29 # follow up in 7 weeks- MD; labs- cbc/cmp;Ca 27-29- mag Dr.B

## 2023-12-21 ENCOUNTER — Other Ambulatory Visit: Payer: Self-pay

## 2023-12-21 ENCOUNTER — Ambulatory Visit
Admission: RE | Admit: 2023-12-21 | Discharge: 2023-12-21 | Disposition: A | Discharge status: Home / Self Care | Source: Ambulatory Visit | Attending: Radiation Oncology | Admitting: Radiation Oncology

## 2023-12-21 DIAGNOSIS — Z510 Encounter for antineoplastic radiation therapy: Secondary | ICD-10-CM | POA: Diagnosis not present

## 2023-12-21 LAB — RAD ONC ARIA SESSION SUMMARY
Course Elapsed Days: 26
Plan Fractions Treated to Date: 16
Plan Prescribed Dose Per Fraction: 2 Gy
Plan Total Fractions Prescribed: 20
Plan Total Prescribed Dose: 40 Gy
Reference Point Dosage Given to Date: 32 Gy
Reference Point Session Dosage Given: 2 Gy
Session Number: 16

## 2023-12-22 ENCOUNTER — Other Ambulatory Visit: Payer: Self-pay

## 2023-12-22 ENCOUNTER — Inpatient Hospital Stay

## 2023-12-22 ENCOUNTER — Inpatient Hospital Stay: Admitting: Occupational Therapy

## 2023-12-22 ENCOUNTER — Ambulatory Visit
Admission: RE | Admit: 2023-12-22 | Discharge: 2023-12-22 | Disposition: A | Discharge status: Home / Self Care | Source: Ambulatory Visit | Attending: Radiation Oncology | Admitting: Radiation Oncology

## 2023-12-22 ENCOUNTER — Telehealth: Payer: Self-pay | Admitting: *Deleted

## 2023-12-22 DIAGNOSIS — Z510 Encounter for antineoplastic radiation therapy: Secondary | ICD-10-CM | POA: Diagnosis not present

## 2023-12-22 DIAGNOSIS — R600 Localized edema: Secondary | ICD-10-CM

## 2023-12-22 LAB — RAD ONC ARIA SESSION SUMMARY
Course Elapsed Days: 27
Plan Fractions Treated to Date: 17
Plan Prescribed Dose Per Fraction: 2 Gy
Plan Total Fractions Prescribed: 20
Plan Total Prescribed Dose: 40 Gy
Reference Point Dosage Given to Date: 34 Gy
Reference Point Session Dosage Given: 2 Gy
Session Number: 17

## 2023-12-22 NOTE — Telephone Encounter (Signed)
 [id number]-RN received incoming msg from [facility] in radiation. patient c/o of erythema/edema in R arm. Dr. Ubaldo recommended apt with [facility]. Per Jesusa, patient was offered [facility] apt today, but declined. She would like to coordinate it with her radiations apts tomorrow. I reviewed chart with Sidra, NP. Ideally, he would like pt to still have her bilateral upper extremity dopplers. Appears that patient had cnl her apts on her own that was arranged for the dopplers last week and she did not r/s. Since patient had already left radiation dept in cancer center at the time of this msg, I attempted to her via phone call to discuss the important of r/s her dopplers. I have left a detailed vm requesting patient to call back. She asked scheduling to coordinate these apts with patient for tomorrow.

## 2023-12-22 NOTE — Therapy (Addendum)
 [facility] - A Dept Of [place]. [facility] [address], [address] [place], [place], [address] Phone: [phone]   Fax:  [phone]  Occupational Therapy SCREEN  Patient Details  Name: Dana Snow MRN: [id number] Date of Birth: 10/08/1945 No data recorded  Encounter Date: 12/22/2023   OT End of Session - 12/22/23 1322     Visit Number 0          Past Medical History:  Diagnosis Date   Breast cancer (HCC) 2015   left lumpectomy 04/05/2014 and reexcision 04/27/2014 - radiation treatment   History of COVID-19    Hypertension    Personal history of radiation therapy 2016   Left breast    Past Surgical History:  Procedure Laterality Date   ABDOMINAL HYSTERECTOMY     BREAST BIOPSY Left 03/15/2014   8:00 us  bx, SCLEROSING ADENOSIS AND COLUMNAR CELL CHANGE    BREAST BIOPSY Left 02/27/2014   invasive lobular carcinoma LIQ   BREAST LUMPECTOMY Left 04/03/2014   IMC, DCIS, ALH, negative LN   BREAST LUMPECTOMY Left 04/27/2014   re-excision for clear margins   COMBINED HYSTEROSCOPY DIAGNOSTIC / D&C     PARTIAL MASTECTOMY WITH AXILLARY SENTINEL LYMPH NODE BIOPSY Left     There were no vitals filed for this visit.       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 PET scan- JULY 2025- Confluent abnormal soft tissue along the upper mediastinum. More towards the right side with involvement of the right lung hilum and separate tissue or nodes thoracic inlet on the right side adjacent to the right clavicle corresponding to the finding by prior CT scan. here are 4 hypermetabolic small bony lesions identified including thoracic spine at T1 and T2, the right acetabulum and left scapula worrisome for bony  metastatic disease. A thoracic spine MRI may be of some benefit to further characterize the spine lesion. Two small nonspecific areas of asymmetric uptake along skeletal muscle of the left gluteus medius muscle and the left pectoralis muscle.  Awaiting on NGS. SEP 2025-  Currently on on  letrozole  + Ribociclib .   # Continue  ribociclib  [ lower dose-400 mg 3 weeks on 1 week off-]- continue letrzole.   Consider Zometa down the line. Will repeat imaging in NOV 2025/ after 3 months of treatment or if getting worse.    # SVC syndrome-/ anisocoria- radiation esophagitis-s/p Magic mouthwash- improved- RT until 9/29.  S/p dexamethasone  2mg /day x7 days. Blurry vision-if not improved defer to Ophthalmology, Dr.Portofillo. Bil UE swelling- reluctant with dopplers- Low clinical concerns for DVT- follow up with [facility].  Discussed slow improvement over time-over the next few months.  However if symptomatic leak getting worse consider re-imaging/Chest venogram-and then consideration of stent with Dr. Marea.   # SEP 2025- [facility]- ENTEROCOCCUS FAECALIS - s/p amoxicillin - improved.    # hyponatremia-multifactorial-sodium in the 130 recently- stable.    # OSTEOPENIA- [BMD= T score- 1.4]; continue ca+ vit D; exercise; monitor closely on therapy   # IV accesss: difficult with larger needles -patient reluctant with port.  However will discuss with Dr. Marea.   # DISPOSITION: # follow up in 3 week- APP: labs- cbc/ mag- Ca 27-29 # follow up in 7 weeks- MD; labs- cbc/cmp;Ca  27-29- mag Dr.B        OT SCREEN 12/22/23:   Patient arrived with reports of increased swelling since being hospitalized.  Patient was discharged on 12/11/2023. Patient receiving daily radiation. Patient reports since discharge from hospital had increased swelling in bilateral hands more than arms. Patient arrived with reports of right hand swelling decreased greatly in the last 24 hours. But the left hand and wrist still continues to seeping  fluid. Patient with increased redness per patient from radiation on right volar forearm - But talked to radiation tech and check in - ? Cellulitis  Talked to pt - she do not want to see symptoms management and denies it - Pt to see per staff Dr Camelia after tx today  Talked to her friend about concerns about her R volar forearm   Patient was fitted with bilateral Isotoner gloves. Medium larger close fingers on the left Smaller medium open fingers on the right. Reinforced with patient to watch out for this seems on the right 1 and not where for too long. Also not twofold or rolled the wrist part down but cut it if it irritates the redness on the right volar forearm  Patient can also do some modified manual lymph drainage on bilateral upper extremity while in the recliner -Pumping upper arms from elbow over shoulder 8-10 reps -Pump dorsal forearm from wrist to elbow 10 reps - From fingers to hand on the dorsal 10 reps -Bump on dorsal forearm all the way to shoulder 10 reps Demonstrated to patient light manual lymph drainage that can be performed at home once a day to twice a day  Patient can also do contrast 1-2 times a day. Patient verbalized understanding recommended directions provided.                                   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 [facility] [address], [address] [place], [place], [address] Phone: [phone]   Fax:  [phone]  Name: Dana Snow MRN: [id number] Date of Birth: 11-25-1945

## 2023-12-23 ENCOUNTER — Ambulatory Visit
Admission: RE | Admit: 2023-12-23 | Discharge: 2023-12-23 | Disposition: A | Discharge status: Home / Self Care | Source: Ambulatory Visit | Attending: Radiation Oncology | Admitting: Radiation Oncology

## 2023-12-23 ENCOUNTER — Other Ambulatory Visit: Payer: Self-pay

## 2023-12-23 ENCOUNTER — Inpatient Hospital Stay (HOSPITAL_BASED_OUTPATIENT_CLINIC_OR_DEPARTMENT_OTHER): Admitting: Hospice and Palliative Medicine

## 2023-12-23 ENCOUNTER — Ambulatory Visit

## 2023-12-23 ENCOUNTER — Encounter: Payer: Self-pay | Admitting: Hospice and Palliative Medicine

## 2023-12-23 ENCOUNTER — Other Ambulatory Visit (HOSPITAL_COMMUNITY): Payer: Self-pay

## 2023-12-23 VITALS — BP 119/67 | HR 81 | Temp 98.2°F | Resp 18 | Ht 62.0 in | Wt 130.0 lb

## 2023-12-23 DIAGNOSIS — R600 Localized edema: Secondary | ICD-10-CM

## 2023-12-23 DIAGNOSIS — Z510 Encounter for antineoplastic radiation therapy: Secondary | ICD-10-CM | POA: Diagnosis not present

## 2023-12-23 LAB — RAD ONC ARIA SESSION SUMMARY
Course Elapsed Days: 28
Plan Fractions Treated to Date: 18
Plan Prescribed Dose Per Fraction: 2 Gy
Plan Total Fractions Prescribed: 20
Plan Total Prescribed Dose: 40 Gy
Reference Point Dosage Given to Date: 36 Gy
Reference Point Session Dosage Given: 2 Gy
Session Number: 18

## 2023-12-23 NOTE — Telephone Encounter (Signed)
 Spoke with patient. She declines at this time for any u/s imaging, but still planned to be evaluated today. Pt added to smc.

## 2023-12-23 NOTE — Progress Notes (Signed)
 Symptom Management Clinic [facility] [phone] [phone]  Patient Care Team: Marikay Eva POUR, PA as PCP - General (Physician Assistant) Rennie Cindy SAUNDERS, MD as Consulting Physician (Oncology)   NAME OF PATIENT: Dana Snow  [id number]  [birth year]   DATE OF VISIT: 12/23/23  REASON FOR CONSULT: Dana Snow is a 78 y.o. 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  INTERVAL HISTORY: Patient recently hospitalized with UTI and hyponatremia.  Patient saw Dr. Rennie on 12/13/2023 for bilateral upper extremity edema.  She was started on furosemide.  Patient saw me in [facility] on 12/17/2023 for persistent upper extremity edema.  She was referred for Dopplers but never obtained.  Patient had follow-up with Dr. Rennie on 12/20/2023 who noted slow improvement but discussed possible consideration of venogram/stenting with vascular surgeon if needed.  Patient has subsequently seen [facility] with OT and was given compressive gloves.  Patient returns to [facility] today.  She reports significant improvement in swelling of the right upper extremity but has a erythema to the forearm.  She denies pruritus or pain.  She feels the redness is improving.  She continues to endorse left upper extremity edema that is weeping at times.  Denies any neurologic complaints. Denies recent fevers or illnesses. Denies any easy bleeding or bruising. Reports good appetite and denies weight loss. Denies chest pain. Denies any nausea, vomiting, constipation, or diarrhea. Denies urinary complaints. Patient offers no further specific complaints today.   PAST MEDICAL HISTORY: Past Medical History:  Diagnosis Date   Breast cancer (HCC) 2015   left lumpectomy 04/05/2014 and reexcision 04/27/2014 - radiation treatment   History of COVID-19    Hypertension    Personal  history of radiation therapy 2016   Left breast    PAST SURGICAL HISTORY:  Past Surgical History:  Procedure Laterality Date   ABDOMINAL HYSTERECTOMY     BREAST BIOPSY Left 03/15/2014   8:00 us  bx, SCLEROSING ADENOSIS AND COLUMNAR CELL CHANGE    BREAST BIOPSY Left 02/27/2014   invasive lobular carcinoma LIQ   BREAST LUMPECTOMY Left 04/03/2014   [facility]C, DCIS, ALH, negative LN   BREAST LUMPECTOMY Left 04/27/2014   re-excision for clear margins   COMBINED HYSTEROSCOPY DIAGNOSTIC / D&C     PARTIAL MASTECTOMY WITH AXILLARY SENTINEL LYMPH NODE BIOPSY Left     HEMATOLOGY/ONCOLOGY HISTORY:  Oncology History Overview Note  #DEC 2015- LEFT  BREAST CANCER STAGE IIA [Mixed Ductal +Lobular Ca; T=2 (3.8CM); psN=0; Dr. Smith]]  ER/PR- >90%; Her 2 Nue- NEG; Lumpec & SLNBx Oncotype-RS=19 No Chemo; s/p RT [Dr.Crystal] April 2016- Arimidex; Stopped Oct 2016; START OCT 2016- Femara ; Sep 19th stopped sec to [facility]; sep 19th 2019- START AROMASIN ; stop summer 2021.  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 PET scan- JULY 2025- Confluent abnormal soft tissue along the upper mediastinum. More towards the right side with involvement of the right lung hilum and separate tissue or nodes thoracic inlet on the right side adjacent to the right clavicle corresponding to the finding by prior CT scan. here are 4 hypermetabolic small bony lesions identified including thoracic spine at T1 and T2, the right acetabulum and left scapula worrisome for bony metastatic disease. A thoracic spine MRI may be of some benefit to further characterize the spine lesion. Two small nonspecific areas  of asymmetric uptake along skeletal muscle of the left gluteus medius muscle and the left pectoralis muscle.   NGS-pending.   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   # BMD- Osteopenia [may 2016]; AUG 2018- T score= -1.1 [improved] ------------------------------------------------------------   DIAGNOSIS: BREAST CANCER    CURRENT/MOST RECENT THERAPY: Surveillance.    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  MEDICATIONS:  Current Outpatient Medications  Medication Sig Dispense Refill   Calcium-Vitamin D  600-200 MG-UNIT per tablet Take 1 tablet by mouth daily.     furosemide (LASIX) 20 MG tablet Take 20 mg by mouth daily.     letrozole  (FEMARA ) 2.5 MG tablet Take 1 tablet (2.5 mg total) by mouth daily. 90 tablet 1   magic mouthwash (nystatin, hydrocortisone, diphenhydrAMINE, lidocaine ) suspension 5 mLs 4 (four) times daily as needed for mouth pain.     metoprolol  tartrate (LOPRESSOR ) 25 MG tablet Take 25 mg by mouth 2 (two) times daily.     ondansetron  (ZOFRAN ) 8 MG tablet One pill every 8 hours as needed for nausea/vomitting. 40 tablet 1   polyethylene glycol powder (MIRALAX ) 17 GM/SCOOP powder Take 17 g by mouth daily. Dissolve 1 capful (17g) in 4-8 ounces of liquid and take by mouth daily. 238 g 3   [Paused] ribociclib  succ (KISQALI  400MG  DAILY DOSE) 200 MG Therapy Pack Take 2 tablets (400 mg total) by mouth daily. Take for 21 days on, 7 days off, repeat every 28 days. 42 tablet 0   [Paused] torsemide  (DEMADEX ) 20 MG tablet Take 20 mg by mouth in the morning, at noon, in the evening, and at bedtime.     vitamin C (ASCORBIC ACID) 500 MG tablet Take 500 mg by mouth daily.     ciprofloxacin  (CIPRO ) 250 MG tablet Take 1 tablet (250 mg total) by mouth  2 (two) times daily. (Patient not taking: Reported on 12/23/2023) 6 tablet 0   dexamethasone  (DECADRON ) 2 MG tablet Take 1 pill twice a day.  Take the second pill early in the evening.  Take with food. (Patient not taking: Reported on 12/23/2023) 14 tablet 0   No current facility-administered medications for this visit.    VITAL SIGNS: BP 119/67   Pulse 81   Temp 98.2 F (36.8 C) (Oral)   Resp 18   Ht 5' 2 (1.575 m)   Wt 130 lb (59 kg)   BMI 23.78 kg/m  Filed Weights   12/23/23 1445  Weight: 130 lb (59 kg)    Estimated body mass index is 23.78 kg/m as calculated from the following:   Height as of this encounter: 5' 2 (1.575 m).   Weight as of this encounter: 130 lb (59 kg).  LABS: CBC:    Component Value Date/Time   WBC 6.6 12/20/2023 1305   WBC 5.2 12/10/2023 0520   HGB 12.4 12/20/2023 1305   HGB 14.8 07/17/2014 1005   HCT 36.8 12/20/2023 1305   HCT 44.1 07/17/2014 1005   PLT 186 12/20/2023 1305   PLT 227 07/17/2014 1005   MCV 96.8 12/20/2023 1305  MCV 92 07/17/2014 1005   NEUTROABS 6.0 12/20/2023 1305   NEUTROABS 2.8 07/17/2014 1005   LYMPHSABS 0.3 (L) 12/20/2023 1305   LYMPHSABS 1.3 07/17/2014 1005   MONOABS 0.4 12/20/2023 1305   MONOABS 0.5 07/17/2014 1005   EOSABS 0.0 12/20/2023 1305   EOSABS 0.3 07/17/2014 1005   BASOSABS 0.0 12/20/2023 1305   BASOSABS 0.1 07/17/2014 1005   Comprehensive Metabolic Panel:    Component Value Date/Time   NA 130 (L) 12/20/2023 1305   K 3.9 12/20/2023 1305   K 3.9 03/29/2014 1402   CL 96 (L) 12/20/2023 1305   CO2 25 12/20/2023 1305   BUN 27 (H) 12/20/2023 1305   CREATININE 0.79 12/20/2023 1305   CREATININE 0.74 07/17/2014 1005   GLUCOSE 130 (H) 12/20/2023 1305   CALCIUM 9.0 12/20/2023 1305   AST 26 12/13/2023 1036   ALT 34 12/13/2023 1036   ALT 16 07/17/2014 1005   ALKPHOS 53 12/13/2023 1036   ALKPHOS 70 07/17/2014 1005   BILITOT 0.9 12/13/2023 1036   PROT 6.7 12/13/2023 1036   PROT 7.4 07/17/2014 1005    ALBUMIN 3.8 12/13/2023 1036   ALBUMIN 4.4 07/17/2014 1005    RADIOGRAPHIC STUDIES: CT ABDOMEN PELVIS W CONTRAST Result Date: 12/10/2023 EXAM: CT ABDOMEN AND PELVIS WITH CONTRAST 12/10/2023 11:34:43 AM TECHNIQUE: CT of the abdomen and pelvis was performed with the administration of intravenous contrast. Multiplanar reformatted images are provided for review. Automated exposure control, iterative reconstruction, and/or weight-based adjustment of the mA/kV was utilized to reduce the radiation dose to as low as reasonably achievable. COMPARISON: None available. CLINICAL HISTORY: Urinary retention, metastatic breast cancer. FINDINGS: LOWER CHEST: Small bilateral pleural effusions are seen at the visualized lung bases, right greater than left. Progressive collapse and consolidation within the visualized right middle lobe. Small pericardial effusion. There is extensive coronary artery calcification, largely within the left anterior descending coronary artery. Cardiac size is within normal limits. LIVER: Mild hepatic steatosis. No focal intrahepatic mass identified GALLBLADDER AND BILE DUCTS: Gallbladder is unremarkable. No biliary ductal dilatation. SPLEEN: No acute abnormality. PANCREAS: No acute abnormality. ADRENAL GLANDS: No acute abnormality. KIDNEYS, URETERS AND BLADDER: The bladder is decompressed; however, there is circumferential bladder wall thickening and perivesicular inflammatory stranding, which may reflect superimposed infectious or inflammatory cystitis. No perivesicular fluid collections are identified. GI AND BOWEL: There is moderate sigmoid diverticulosis. There is moderate stool within the rectal vault with superimposed rectal wall thickening and perirectal inflammatory stranding and mild presacral edema, which may reflect changes of a superimposed proctitis, possibly circle proctitis. There is no evidence of obstruction. PERITONEUM AND RETROPERITONEUM: No ascites. No free air. VASCULATURE: Aorta  is normal in caliber. Mild aortoiliac atherosclerotic calcification. LYMPH NODES: No lymphadenopathy. REPRODUCTIVE ORGANS: Status post hysterectomy. No adnexal masses. BONES AND SOFT TISSUES: The osseous structures are age appropriate. No focal lytic or blastic bone lesion. IMPRESSION: 1. Circumferential bladder wall thickening and perivesicular inflammatory stranding, possibly reflecting superimposed infectious or inflammatory cystitis. No perivesicular fluid collections identified. 2. Moderate sigmoid diverticulosis without evidence of diverticulitis. 3. Rectal wall thickening with perirectal inflammatory stranding and mild presacral edema, possibly reflecting superimposed proctitis. No evidence of obstruction or perforation. 4. No evidence of metastatic disease within abdomen and pelvis Electronically signed by: Dorethia Molt MD 12/10/2023 11:57 AM EDT RP Workstation: [id number]    PERFORMANCE STATUS (ECOG) : 2 - Symptomatic, <50% confined to bed  Review of Systems Unless otherwise noted, a complete review of systems is negative.  Physical Exam General: NAD Cardiovascular:  regular rate and rhythm Pulmonary: clear anterior/posterior fields Abdomen: soft, nontender, + bowel sounds GU: no suprapubic tenderness Extremities: BUE/BLE edema, no joint deformities Skin: no rashes Neurological: Weakness but otherwise nonfocal    IMPRESSION/PLAN: Stage IV breast cancer -receiving XRT to mediastinal mass.  On treatment with Ribociclib  plus letrozole .  Upper extremity edema -patient has improved edema in the right upper extremity but L UE edema persists.  Can continue low-dose diuretics.  Recommend follow-up with [place] and use of compressive gloves.  Site of erythema improving per patient.  This does not appear to be overtly cellulitic in appearance.  No systemic signs of infection.  Discussed options for empirically covering her with antibiotic versus Dopplers versus venogram.  Patient would prefer  watchful waiting with plan for follow-up next week.  Case and plan discussed with Dr. Rennie  Patient expressed understanding and was in agreement with this plan. She also understands that She can call clinic at any time with any questions, concerns, or complaints.   Thank you for allowing me to participate in the care of this very pleasant patient.   Time Total: 15 minutes  Visit consisted of counseling and education dealing with the complex and emotionally intense issues of symptom management in the setting of serious illness.Greater than 50%  of this time was spent counseling and coordinating care related to the above assessment and plan.  Signed by: Fonda Mower, PhD, NP-C

## 2023-12-24 ENCOUNTER — Ambulatory Visit

## 2023-12-24 ENCOUNTER — Ambulatory Visit
Admission: RE | Admit: 2023-12-24 | Discharge: 2023-12-24 | Disposition: A | Discharge status: Home / Self Care | Source: Ambulatory Visit | Attending: Radiation Oncology | Admitting: Radiation Oncology

## 2023-12-24 ENCOUNTER — Other Ambulatory Visit: Payer: Self-pay

## 2023-12-24 DIAGNOSIS — Z510 Encounter for antineoplastic radiation therapy: Secondary | ICD-10-CM | POA: Diagnosis not present

## 2023-12-24 LAB — RAD ONC ARIA SESSION SUMMARY
Course Elapsed Days: 29
Plan Fractions Treated to Date: 19
Plan Prescribed Dose Per Fraction: 2 Gy
Plan Total Fractions Prescribed: 20
Plan Total Prescribed Dose: 40 Gy
Reference Point Dosage Given to Date: 38 Gy
Reference Point Session Dosage Given: 2 Gy
Session Number: 19

## 2023-12-27 ENCOUNTER — Encounter: Admitting: Hospice and Palliative Medicine

## 2023-12-27 ENCOUNTER — Ambulatory Visit
Admission: RE | Admit: 2023-12-27 | Discharge: 2023-12-27 | Disposition: A | Source: Ambulatory Visit | Attending: Radiation Oncology | Admitting: Radiation Oncology

## 2023-12-27 ENCOUNTER — Other Ambulatory Visit: Payer: Self-pay

## 2023-12-27 ENCOUNTER — Other Ambulatory Visit (HOSPITAL_COMMUNITY): Payer: Self-pay

## 2023-12-27 ENCOUNTER — Other Ambulatory Visit: Payer: Self-pay | Admitting: Internal Medicine

## 2023-12-27 DIAGNOSIS — Z510 Encounter for antineoplastic radiation therapy: Secondary | ICD-10-CM | POA: Diagnosis not present

## 2023-12-27 DIAGNOSIS — C50312 Malignant neoplasm of lower-inner quadrant of left female breast: Secondary | ICD-10-CM

## 2023-12-27 LAB — RAD ONC ARIA SESSION SUMMARY
Course Elapsed Days: 32
Plan Fractions Treated to Date: 20
Plan Prescribed Dose Per Fraction: 2 Gy
Plan Total Fractions Prescribed: 20
Plan Total Prescribed Dose: 40 Gy
Reference Point Dosage Given to Date: 40 Gy
Reference Point Session Dosage Given: 2 Gy
Session Number: 20

## 2023-12-27 MED ORDER — RIBOCICLIB SUCC (400 MG DOSE) 200 MG PO TBPK
400.0000 mg | ORAL_TABLET | Freq: Every day | ORAL | 0 refills | Status: DC
  Filled 2023-12-27: qty 42, 28d supply, fill #0

## 2023-12-27 NOTE — Progress Notes (Signed)
 Specialty Pharmacy Refill Coordination Note  Dana Snow is a 78 y.o. female contacted today regarding refills of specialty medication(s) Ribociclib  Succinate (KISQALI  400mg  Daily Dose)   Patient requested Delivery   Delivery date: 01/05/24   Verified address: [address]   [place] [address]   Medication will be filled on 01/04/24. This fill date is pending response to refill request from provider. Patient is aware and if they have not received fill by intended date they must follow up with pharmacy.

## 2023-12-27 NOTE — Progress Notes (Signed)
 Specialty Pharmacy Ongoing Clinical Assessment Note  Dana Snow is a 78 y.o. female who is being followed by the specialty pharmacy service for RxSp Oncology   Patient's specialty medication(s) reviewed today: Ribociclib  Succinate (KISQALI  400mg  Daily Dose)   Missed doses in the last 4 weeks: 7 (held during antibiotic treatment by MD)   Patient/Caregiver did not have any additional questions or concerns.   Therapeutic benefit summary: Unable to assess   Adverse events/side effects summary: No adverse events/side effects   Patient's therapy is appropriate to: Continue    Goals Addressed             This Visit's Progress    Slow Disease Progression   No change    Patient is initiating therapy. Patient will maintain adherence         Follow up: 3 months  Silvano LOISE Dolly Specialty Pharmacist

## 2023-12-28 NOTE — Radiation Completion Notes (Signed)
 Patient Name: Dana Snow, Dana Snow MRN: [id number] Date of Birth: 11-18-1945 Referring Physician: MIRIAM MCLAUGHLIN, M.D. Date of Service: 2023-12-28 Radiation Oncologist: Marcey Penton, M.D. 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  She had been on Aromasin  and stopped that in 2021.  Most recently she has complained of increasing shortness of breath dyspnea on exertion.  She also has developed facial plethora as well as collateral vessels on her anterior chest wall.  PET scan in July showed confluent abnormal soft tissue along the upper mediastinum with involvement of the right lung hilum and nodes involving the thoracic inlet on the right side adjacent to the right clavicle.  There are also 4 hypermetabolic small bony lesions within the thoracic spine at T1-T2 levels.  Also right acetabulum left scapula worrisome for metastatic disease.  She is scheduled for biopsy today.  She had a CT venogram yesterday showing interval increase in size of infiltrative tissue in the upper mediastinum encasing the roots of the great vessels of the aortic arch left innominate vein and SVC.  There is high-grade narrowing the central SVC for approximately 5 cm.  She is seen today for evaluation of SVC.  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      ==========DELIVERED PLANS==========  First Treatment Date: 2023-11-25 Last Treatment Date:  2023-12-27   Plan Name: Lung_R Site: Lung, Right Technique: 3D Mode: Photon Dose Per Fraction: 2 Gy Prescribed Dose (Delivered / Prescribed): 40 Gy / 40 Gy Prescribed Fxs (Delivered / Prescribed): 20 / 20     ==========ON TREATMENT VISIT DATES========== 2023-11-30, 2023-12-07, 2023-12-14, 2023-12-20, 2023-12-27     ==========UPCOMING VISITS========== 02/07/2024 [facility] EST PT Rennie Cindy SAUNDERS, MD  02/07/2024 [facility]  01/31/2024 [facility] ONCOLOGY FOLLOW UP 30 Penton Marcey, MD  01/10/2024 [facility] EST PT Dasie Tinnie MATSU, NP  01/10/2024 [facility]        ==========APPENDIX - ON TREATMENT VISIT NOTES==========   See weekly On Treatment Notes in Epic for details in the Media tab (listed as Progress notes on the On Treatment Visit Dates listed above).

## 2024-01-03 ENCOUNTER — Other Ambulatory Visit: Payer: Self-pay

## 2024-01-03 ENCOUNTER — Emergency Department

## 2024-01-03 ENCOUNTER — Telehealth: Payer: Self-pay | Admitting: *Deleted

## 2024-01-03 ENCOUNTER — Observation Stay
Admission: EM | Admit: 2024-01-03 | Discharge: 2024-01-04 | Disposition: A | Discharge status: Home / Self Care | Attending: Internal Medicine | Admitting: Internal Medicine

## 2024-01-03 ENCOUNTER — Observation Stay

## 2024-01-03 DIAGNOSIS — W19XXXA Unspecified fall, initial encounter: Secondary | ICD-10-CM | POA: Diagnosis not present

## 2024-01-03 DIAGNOSIS — N179 Acute kidney failure, unspecified: Secondary | ICD-10-CM | POA: Diagnosis not present

## 2024-01-03 DIAGNOSIS — R296 Repeated falls: Secondary | ICD-10-CM

## 2024-01-03 DIAGNOSIS — M6281 Muscle weakness (generalized): Secondary | ICD-10-CM | POA: Insufficient documentation

## 2024-01-03 DIAGNOSIS — R000 Tachycardia, unspecified: Secondary | ICD-10-CM

## 2024-01-03 DIAGNOSIS — I10 Essential (primary) hypertension: Secondary | ICD-10-CM | POA: Insufficient documentation

## 2024-01-03 DIAGNOSIS — F1090 Alcohol use, unspecified, uncomplicated: Secondary | ICD-10-CM | POA: Diagnosis not present

## 2024-01-03 DIAGNOSIS — Z515 Encounter for palliative care: Secondary | ICD-10-CM

## 2024-01-03 DIAGNOSIS — Z170 Estrogen receptor positive status [ER+]: Secondary | ICD-10-CM | POA: Insufficient documentation

## 2024-01-03 DIAGNOSIS — E876 Hypokalemia: Principal | ICD-10-CM | POA: Diagnosis present

## 2024-01-03 DIAGNOSIS — Z87891 Personal history of nicotine dependence: Secondary | ICD-10-CM | POA: Diagnosis not present

## 2024-01-03 DIAGNOSIS — C50919 Malignant neoplasm of unspecified site of unspecified female breast: Secondary | ICD-10-CM | POA: Diagnosis not present

## 2024-01-03 DIAGNOSIS — R7989 Other specified abnormal findings of blood chemistry: Secondary | ICD-10-CM

## 2024-01-03 DIAGNOSIS — R531 Weakness: Secondary | ICD-10-CM

## 2024-01-03 DIAGNOSIS — R7401 Elevation of levels of liver transaminase levels: Secondary | ICD-10-CM

## 2024-01-03 LAB — CBC
HCT: 43.7 % (ref 36.0–46.0)
Hemoglobin: 14.8 g/dL (ref 12.0–15.0)
MCH: 32.5 pg (ref 26.0–34.0)
MCHC: 33.9 g/dL (ref 30.0–36.0)
MCV: 96 fL (ref 80.0–100.0)
Platelets: 183 K/uL (ref 150–400)
RBC: 4.55 MIL/uL (ref 3.87–5.11)
RDW: 16.1 % — ABNORMAL HIGH (ref 11.5–15.5)
WBC: 3.8 K/uL — ABNORMAL LOW (ref 4.0–10.5)
nRBC: 0 % (ref 0.0–0.2)

## 2024-01-03 LAB — COMPREHENSIVE METABOLIC PANEL WITH GFR
ALT: 702 U/L — ABNORMAL HIGH (ref 0–44)
AST: 209 U/L — ABNORMAL HIGH (ref 15–41)
Albumin: 3.8 g/dL (ref 3.5–5.0)
Alkaline Phosphatase: 55 U/L (ref 38–126)
Anion gap: 21 — ABNORMAL HIGH (ref 5–15)
BUN: 39 mg/dL — ABNORMAL HIGH (ref 8–23)
CO2: 28 mmol/L (ref 22–32)
Calcium: 9.8 mg/dL (ref 8.9–10.3)
Chloride: 87 mmol/L — ABNORMAL LOW (ref 98–111)
Creatinine, Ser: 1.21 mg/dL — ABNORMAL HIGH (ref 0.44–1.00)
GFR, Estimated: 46 mL/min — ABNORMAL LOW (ref >60)
Glucose, Bld: 131 mg/dL — ABNORMAL HIGH (ref 70–99)
Potassium: 2.8 mmol/L — ABNORMAL LOW (ref 3.5–5.1)
Sodium: 136 mmol/L (ref 135–145)
Total Bilirubin: 2.5 mg/dL — ABNORMAL HIGH (ref 0.0–1.2)
Total Protein: 7.1 g/dL (ref 6.5–8.1)

## 2024-01-03 LAB — URINALYSIS, ROUTINE W REFLEX MICROSCOPIC
Bilirubin Urine: NEGATIVE
Glucose, UA: NEGATIVE mg/dL
Hgb urine dipstick: NEGATIVE
Ketones, ur: NEGATIVE mg/dL
Leukocytes,Ua: NEGATIVE
Nitrite: NEGATIVE
Protein, ur: NEGATIVE mg/dL
Specific Gravity, Urine: 1.009 (ref 1.005–1.030)
pH: 6 (ref 5.0–8.0)

## 2024-01-03 LAB — MAGNESIUM: Magnesium: 2.6 mg/dL — ABNORMAL HIGH (ref 1.7–2.4)

## 2024-01-03 LAB — TROPONIN I (HIGH SENSITIVITY)
Troponin I (High Sensitivity): 25 ng/L — ABNORMAL HIGH (ref <18)
Troponin I (High Sensitivity): 32 ng/L — ABNORMAL HIGH (ref <18)

## 2024-01-03 LAB — LIPASE, BLOOD: Lipase: 48 U/L (ref 11–51)

## 2024-01-03 MED ORDER — ENSURE PLUS HIGH PROTEIN PO LIQD
237.0000 mL | Freq: Two times a day (BID) | ORAL | Status: DC
  Administered 2024-01-04: 237 mL via ORAL

## 2024-01-03 MED ORDER — POTASSIUM CHLORIDE CRYS ER 20 MEQ PO TBCR
40.0000 meq | EXTENDED_RELEASE_TABLET | Freq: Once | ORAL | Status: AC
  Administered 2024-01-03: 40 meq via ORAL
  Filled 2024-01-03: qty 2

## 2024-01-03 MED ORDER — POTASSIUM CHLORIDE 10 MEQ/100ML IV SOLN
10.0000 meq | INTRAVENOUS | Status: AC
  Administered 2024-01-03 (×3): 10 meq via INTRAVENOUS
  Filled 2024-01-03 (×2): qty 100

## 2024-01-03 MED ORDER — TRAMADOL HCL 50 MG PO TABS: 50.0000 mg | ORAL_TABLET | Freq: Three times a day (TID) | ORAL | Status: DC | PRN

## 2024-01-03 MED ORDER — SODIUM CHLORIDE 0.9 % IV BOLUS
1000.0000 mL | Freq: Once | INTRAVENOUS | Status: AC
  Administered 2024-01-03: 1000 mL via INTRAVENOUS

## 2024-01-03 MED ORDER — METOPROLOL TARTRATE 25 MG PO TABS: 25.0000 mg | ORAL_TABLET | Freq: Two times a day (BID) | ORAL | Status: DC

## 2024-01-03 MED ORDER — LETROZOLE 2.5 MG PO TABS
2.5000 mg | ORAL_TABLET | Freq: Every day | ORAL | Status: DC
  Administered 2024-01-03 – 2024-01-04 (×2): 2.5 mg via ORAL
  Filled 2024-01-03 (×3): qty 1

## 2024-01-03 MED ORDER — POTASSIUM CHLORIDE 2 MEQ/ML IV SOLN
INTRAVENOUS | Status: DC
  Filled 2024-01-03 (×2): qty 1000

## 2024-01-03 MED ORDER — ONDANSETRON HCL 4 MG PO TABS: 4.0000 mg | ORAL_TABLET | Freq: Four times a day (QID) | ORAL | Status: DC | PRN

## 2024-01-03 MED ORDER — IOHEXOL 350 MG/ML SOLN
80.0000 mL | Freq: Once | INTRAVENOUS | Status: AC | PRN
  Administered 2024-01-03: 80 mL via INTRAVENOUS

## 2024-01-03 MED ORDER — METOPROLOL TARTRATE 25 MG PO TABS
12.5000 mg | ORAL_TABLET | Freq: Two times a day (BID) | ORAL | Status: DC
  Administered 2024-01-03 – 2024-01-04 (×2): 12.5 mg via ORAL
  Filled 2024-01-03 (×2): qty 1

## 2024-01-03 MED ORDER — ONDANSETRON HCL 4 MG/2ML IJ SOLN: 4.0000 mg | Freq: Four times a day (QID) | INTRAMUSCULAR | Status: DC | PRN

## 2024-01-03 MED ORDER — ENOXAPARIN SODIUM 40 MG/0.4ML IJ SOSY
40.0000 mg | PREFILLED_SYRINGE | INTRAMUSCULAR | Status: DC
  Administered 2024-01-03: 40 mg via SUBCUTANEOUS
  Filled 2024-01-03: qty 0.4

## 2024-01-03 MED ORDER — POLYETHYLENE GLYCOL 3350 17 G PO PACK: 17.0000 g | PACK | Freq: Every day | ORAL | Status: DC | PRN

## 2024-01-03 NOTE — Telephone Encounter (Signed)
 Over the weekend she has fallen 2 times and she had a walker at both times she is very weak some confusion.  Last week in had some intermittent diarrhea and she thinks that she has finished that now.  Husband is not strong enough to be able to help her if she falls again.  Daughter is asking if maybe she should go into a rehab. I called the patient and no answer. Then I called the daughter and left message that I called the patient and no on answered and left  message to call back we want the pt. Do be seen in office, and called the daughter.

## 2024-01-03 NOTE — Assessment & Plan Note (Signed)
 Likely secondary to dehydration.  Patient was also recently taken off from home metoprolol .  Metoprolol  and Cardizem both were listed in her chart, she was recently taken off by PCP. - Restarting home metoprolol  at a lower dose of 12.5 mg twice daily -Continue to monitor

## 2024-01-03 NOTE — Assessment & Plan Note (Signed)
 Generalized weakness.  Patient with generalized worsening weakness, having difficulty ambulation even with walker now. Multifactorial with stage IV breast cancer and superadded diarrhea. All imaging negative for any acute injury -PT and OT evaluation

## 2024-01-03 NOTE — Assessment & Plan Note (Signed)
 Patient with a history of recent diarrhea which has been improved.  She was also taking torsemide .  Magnesium normal - Admit to MedSurg -Replace potassium and monitor

## 2024-01-03 NOTE — ED Notes (Signed)
 Patient transported to CT

## 2024-01-03 NOTE — ED Notes (Signed)
 Pt unable to tolerate oral potassium. Pt vomited pills out. Pt states she is unable to swallow pill that big.

## 2024-01-03 NOTE — H&P (Signed)
 History and Physical    Patient: Dana Snow FMW:[id number] DOB: [birth year] DOA: 01/03/2024 DOS: the patient was seen and examined on 01/03/2024 PCP: Marikay Eva POUR, PA  Patient coming from: Home  Chief Complaint:  Chief Complaint  Patient presents with   Fall   HPI: Dana Snow is a 78 y.o. 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  Patient is currently on immunotherapy.  Had recent radiation and antibiotics.  She was also taking laxatives which resulted in diarrhea lasting for few days and resulted in progressive weakness.  Diarrhea improved since yesterday after taking Imodium.  No abdominal pain.  No nausea, vomiting or diarrhea.  Overall decreased appetite.  No other recent illness.  Patient with exertional dyspnea since the diagnosis of breast cancer recurrence in August 2025, no orthopnea or PND.  Patient with no urinary symptoms.  No fever or chills.  Patient was having difficulty walking around and ADL even with a walker.  Lives with her husband who is having difficulty providing care now.  Denies any smoking or alcohol use.  ED course and data reviewed.  On presentation borderline soft blood pressure with tachycardia.  Saturating well on room air.  Labs with potassium of 2.8, magnesium normal, BUN 39, creatinine 1.21, AST 209, ALT 702, alkaline phosphatase 55, T. bili 2.5, UA not consistent with UTI.  Multiple imaging which include CT head, CT cervical spine, CTA chest and CT abdomen and pelvis was negative for any acute abnormality.  No pulmonary embolism.  Persistent dense soft tissue infiltrate throughout the superior mediastinum encasing the aortic branch of vessel origin and extending inferiorly to the subcu peritoneal station and right  Tildiem, also shows unchanged small osseous mets involving the posterior aspect of T2 vertebral body.  EKG, personally reviewed, sinus tachycardia and left axis deviation.  Patient was given IV fluid and potassium supplement.  PT and OT was ordered.  ERCP ordered by EDP.  Review of Systems: As mentioned in the history of present illness. All other systems reviewed and are negative. Past Medical History:  Diagnosis Date   Breast cancer (HCC) 2015   left lumpectomy 04/05/2014 and reexcision 04/27/2014 - radiation treatment   History of COVID-19    Hypertension    Personal history of radiation therapy 2016   Left breast   Past Surgical History:  Procedure Laterality Date   ABDOMINAL HYSTERECTOMY     BREAST BIOPSY Left 03/15/2014   8:00 us  bx, SCLEROSING ADENOSIS AND COLUMNAR CELL CHANGE    BREAST BIOPSY Left 02/27/2014   invasive lobular carcinoma LIQ   BREAST LUMPECTOMY Left 04/03/2014   [facility], DCIS, ALH, negative LN   BREAST LUMPECTOMY Left 04/27/2014   re-excision for clear margins   COMBINED HYSTEROSCOPY DIAGNOSTIC / D&C     PARTIAL MASTECTOMY WITH AXILLARY SENTINEL LYMPH NODE BIOPSY Left    Social History:  reports that she has quit smoking. Her smoking use included cigarettes. She has never used smokeless tobacco. She reports current alcohol use of about 2.0 standard drinks of alcohol per week. She reports that she does not use drugs.  Allergies  Allergen Reactions   Acetaminophen     Codeine    Codone [Hydrocodone] Other (See Comments)    hyperactive   Oxycodone Hcl    Percocet [Oxycodone-Acetaminophen ] Other (See Comments)  lupy   Penicillins Rash    Family History  Problem Relation Age of Onset   Breast cancer Neg Hx     Prior to Admission medications   Medication Sig Start Date End Date Taking? Authorizing Provider  diltiazem (CARDIZEM CD) 120 MG 24 hr capsule Take 120 mg by mouth daily. 12/23/23  Yes [provider]  Calcium-Vitamin D  600-200 MG-UNIT  per tablet Take 1 tablet by mouth daily.    [provider]  ciprofloxacin  (CIPRO ) 250 MG tablet Take 1 tablet (250 mg total) by mouth 2 (two) times daily. Patient not taking: Reported on 12/23/2023 12/11/23   Melanee Annah BROCKS, MD  dexamethasone  (DECADRON ) 2 MG tablet Take 1 pill twice a day.  Take the second pill early in the evening.  Take with food. Patient not taking: Reported on 12/23/2023 12/13/23   Brahmanday, Govinda R, MD  furosemide (LASIX) 20 MG tablet Take 20 mg by mouth daily.    [provider]  letrozole  (FEMARA ) 2.5 MG tablet Take 1 tablet (2.5 mg total) by mouth daily. 11/26/23   Brahmanday, Govinda R, MD  magic mouthwash (nystatin, hydrocortisone, diphenhydrAMINE, lidocaine ) suspension 5 mLs 4 (four) times daily as needed for mouth pain.    [provider]  metoprolol  tartrate (LOPRESSOR ) 25 MG tablet Take 25 mg by mouth 2 (two) times daily. 11/19/23 11/18/24  [provider]  ondansetron  (ZOFRAN ) 8 MG tablet One pill every 8 hours as needed for nausea/vomitting. 11/26/23   Brahmanday, Govinda R, MD  polyethylene glycol powder (MIRALAX ) 17 GM/SCOOP powder Take 17 g by mouth daily. Dissolve 1 capful (17g) in 4-8 ounces of liquid and take by mouth daily. 12/11/23   Wouk, Devaughn Sayres, MD  ribociclib  succ (KISQALI  400MG  DAILY DOSE) 200 MG Therapy Pack Take 2 tablets (400 mg total) by mouth daily. Take for 21 days on, 7 days off, repeat every 28 days. 12/27/23   Brahmanday, Govinda R, MD  torsemide  (DEMADEX ) 20 MG tablet Take 20 mg by mouth in the morning, at noon, in the evening, and at bedtime. 11/19/23 11/18/24  [provider]  vitamin C (ASCORBIC ACID) 500 MG tablet Take 500 mg by mouth daily.    [provider]    Physical Exam: Vitals:   01/03/24 1028 01/03/24 1030 01/03/24 1245 01/03/24 1422  BP: 120/81  105/62 125/77  Pulse:  (!) 115 (!) 126 (!) 117  Resp: 18  18 20   Temp:  97.7 F (36.5 C)    TempSrc:  Oral    SpO2: 96%  96% 99%    Vitals:   01/03/24 1028 01/03/24 1030 01/03/24 1245 01/03/24 1422  BP: 120/81  105/62 125/77  Pulse:  (!) 115 (!) 126 (!) 117  Resp: 18  18 20   Temp:  97.7 F (36.5 C)    TempSrc:  Oral    SpO2: 96%  96% 99%   General: Vital signs reviewed.  Frail elderly lady, in no acute distress and cooperative with exam.  Head: Normocephalic and atraumatic. Eyes: EOMI, conjunctivae normal, no scleral icterus.  Neck: Supple, trachea midline, normal ROM, Cardiovascular: Sinus tachycardia Pulmonary/Chest: Clear to auscultation bilaterally, no wheezes, rales, or rhonchi. Abdominal: Soft, non-tender, non-distended, BS +,  Extremities: No lower extremity edema bilaterally,  pulses symmetric and intact bilaterally. No cyanosis or clubbing. Neurological: A&O x3, Strength is normal and symmetric bilaterally, cranial nerve II-XII are grossly intact, no focal motor deficit, sensory intact to light touch bilaterally.  Skin: Warm, dry and intact.  Psychiatric: Normal mood and affect.  Data Reviewed: Prior data reviewed as mentioned above.  Assessment and Plan: * Hypokalemia due to excessive gastrointestinal loss of potassium Patient with a history of recent diarrhea which has been improved.  She was also taking torsemide .  Magnesium normal - Admit to MedSurg -Replace potassium and monitor  AKI (acute kidney injury) Likely prerenal secondary to dehydration with diarrhea and continue to take torsemide . - Continue with IV fluid for another day -Monitor renal function -Avoid nephrotoxins  Frequent falls Generalized weakness.  Patient with generalized worsening weakness, having difficulty ambulation even with walker now. Multifactorial with stage IV breast cancer and superadded diarrhea. All imaging negative for any acute injury -PT and OT evaluation  Transaminitis Significant transaminitis and T. bili of 2.4.  Patient denies any abdominal pain.  CT abdomen was negative for any acute abnormalities  so ERCP was ordered by EDP -Dehydration versus disease progression with history of stage IV breast cancer with extensive metastatic disease involving mediastinum and upper abdomen. - Ordered acute hepatitis panel -ERCP pending -Monitor CMP  Sinus tachycardia Likely secondary to dehydration.  Patient was also recently taken off from home metoprolol .  Metoprolol  and Cardizem both were listed in her chart, she was recently taken off by PCP. - Restarting home metoprolol  at a lower dose of 12.5 mg twice daily -Continue to monitor  Stage IV breast cancer in female [facility]) Patient with history of recurrence of breast cancer with multiple mets involving bone, mediastinum causing superior vena cava syndrome. - Continue letrozole  -Patient also take Kisqali -currently on a week break -Dr. Rennie was notified at her request -Patient will get benefit from palliative care   Advance Care Planning:   Code Status: Full Code discussed with patient and daughter.  Consults: None  Family Communication: Discussed with daughter at bedside  Severity of Illness: The appropriate patient status for this patient is OBSERVATION. Observation status is judged to be reasonable and necessary in order to provide the required intensity of service to ensure the patient's safety. The patient's presenting symptoms, physical exam findings, and initial radiographic and laboratory data in the context of their medical condition is felt to place them at decreased risk for further clinical deterioration. Furthermore, it is anticipated that the patient will be medically stable for discharge from the hospital within 2 midnights of admission.   This record has been created using Conservation officer, historic buildings. Errors have been sought and corrected,but may not always be located. Such creation errors do not reflect on the standard of care.   Author: Amaryllis Dare, MD 01/03/2024 3:48 PM  For on call review [email or web address].

## 2024-01-03 NOTE — Assessment & Plan Note (Signed)
 Patient with history of recurrence of breast cancer with multiple mets involving bone, mediastinum causing superior vena cava syndrome. - Continue letrozole  -Patient also take Kisqali -currently on a week break -Dr. Rennie was notified at her request -Patient will get benefit from palliative care

## 2024-01-03 NOTE — ED Triage Notes (Signed)
 First nurse note: Pt to ED via POV from [facility]. Pt sent to ED for frequent falls, dizziness and episodes of hypotension and tachycardia.

## 2024-01-03 NOTE — ED Provider Notes (Signed)
 Dana Snow Provider Note    Event Date/Time   First MD Initiated Contact with Patient 01/03/24 1237     (approximate)   History   Fall   HPI  Dana Snow is a 78 y.o. female history of breast cancer status post radiation last week, history of hypertension, presenting with weakness, falls.  Per independent straight from daughter and husband, patient had been unsteady after radiation was done, they got her walker, says that today her legs gave out twice and she fell, positive head strike but no LOC.  She denies pain anywhere.  Has been having diarrhea for the last 2 weeks, nonbloody.  Daughter gave her some Imodium and she has not had any additional diarrhea today.  She denies any abdominal pain or chest pain.  Does have some lightheadedness and shortness of breath.  Daughter states that patient had completed a course of antibiotics for UTI 2 weeks ago, was also found to be constipated, states that after 2 days of MiraLAX , patient started having diarrhea.  They went to see primary care today who sent her in for evaluation.  Husband is not strong enough to care for her at home and daughter is asking for admission to rehab as well as palliative care consult.  On independent chart review, patient was seen by primary care today for falls as well as fatigue, weakness, dizziness, blood pressures were initially low yesterday.  Had reportedly blacked out this morning, even though family did not say that she passed out.     Physical Exam   Triage Vital Signs: ED Triage Vitals  Encounter Vitals Group     BP 01/03/24 1028 120/81     Girls Systolic BP Percentile --      Girls Diastolic BP Percentile --      Boys Systolic BP Percentile --      Boys Diastolic BP Percentile --      Pulse Rate 01/03/24 1030 (!) 115     Resp 01/03/24 1028 18     Temp 01/03/24 1030 97.7 F (36.5 C)     Temp Source 01/03/24 1030 Oral     SpO2 01/03/24 1028 96 %     Weight --       Height --      Head Circumference --      Peak Flow --      Pain Score --      Pain Loc --      Pain Education --      Exclude from Growth Chart --     Most recent vital signs: Vitals:   01/03/24 1245 01/03/24 1422  BP: 105/62 125/77  Pulse: (!) 126 (!) 117  Resp: 18 20  Temp:    SpO2: 96% 99%     General: Awake, no distress.  CV:  Good peripheral perfusion.  Resp:  Normal effort.  No thoracic tenderness Abd:  No distention.  Soft nontender Other:  No obvious jaundice, no palpable skull deformities or tenderness, no mass mild tenderness, no tenderness to all 4 extremities, full range of motion is intact without focal weakness or numbness.  Dry mucous membranes.   ED Results / Procedures / Treatments   Labs (all labs ordered are listed, but only abnormal results are displayed) Labs Reviewed  COMPREHENSIVE METABOLIC PANEL WITH GFR - Abnormal; Notable for the following components:      Result Value   Potassium 2.8 (*)    Chloride 87 (*)  Glucose, Bld 131 (*)    BUN 39 (*)    Creatinine, Ser 1.21 (*)    AST 209 (*)    ALT 702 (*)    Total Bilirubin 2.5 (*)    GFR, Estimated 46 (*)    Anion gap 21 (*)    All other components within normal limits  CBC - Abnormal; Notable for the following components:   WBC 3.8 (*)    RDW 16.1 (*)    All other components within normal limits  URINALYSIS, ROUTINE W REFLEX MICROSCOPIC - Abnormal; Notable for the following components:   Color, Urine YELLOW (*)    APPearance CLEAR (*)    All other components within normal limits  MAGNESIUM - Abnormal; Notable for the following components:   Magnesium 2.6 (*)    All other components within normal limits  TROPONIN I (HIGH SENSITIVITY) - Abnormal; Notable for the following components:   Troponin I (High Sensitivity) 25 (*)    All other components within normal limits  C DIFFICILE QUICK SCREEN W PCR REFLEX    GASTROINTESTINAL PANEL BY PCR, STOOL (REPLACES STOOL CULTURE)  LIPASE, BLOOD   HEPATITIS PANEL, ACUTE  CBG MONITORING, ED  TROPONIN I (HIGH SENSITIVITY)     EKG  EKG shows, sinus tachycardia, rate 124, normal QS, no obvious ischemic ST elevation, this is changed compared to prior  EKG shows sinus tachycardia, rate 113, normal QRS, QTc is 498, no obvious ischemic ST elevation, T wave flattening in aVL, V2, V3, is new compared to prior.   RADIOLOGY On my independent interpretation, CT without obvious intracranial hemorrhage   PROCEDURES:  Critical Care performed: No  Procedures   MEDICATIONS ORDERED IN ED: Medications  potassium chloride 10 mEq in 100 mL IVPB (10 mEq Intravenous New Bag/Given 01/03/24 1444)  sodium chloride  0.9 % bolus 1,000 mL (1,000 mLs Intravenous New Bag/Given 01/03/24 1255)  iohexol  (OMNIPAQUE ) 350 MG/ML injection 80 mL (80 mLs Intravenous Contrast Given 01/03/24 1351)     IMPRESSION / MDM / ASSESSMENT AND PLAN / ED COURSE  I reviewed the triage vital signs and the nursing notes.                              Differential diagnosis includes, but is not limited to, for the fall, considered intracranial hemorrhage, fracture, contusion, strain, sprain.  For her diarrhea did consider colitis, diverticulitis, C. difficile, viral illness, gastroenteritis.  Her weakness and lightheadedness might be related to her diarrhea and decreased p.o. intake as well as dehydration, also consider electrolyte derangements, atypical ACS, UTI.  Given her shortness of breath as well as a history of cancer, did also consider PE.  Will get labs, EKG, troponin, chest x-ray, UA, CT head, cervical spine, CT PE study, CT abdomen pelvis.  Will give her some IV fluids here.  Patient's presentation is most consistent with acute presentation with potential threat to life or bodily function.  Independent interpretation of labs and imaging below.  CT did not show any traumatic injury, no evidence of PE, gallbladder without any gallstones, no biliary dilatation.  Given  this new change to her LFTs, will do an MRCP to evaluate for mass or blockage.  Given her AKI, dehydration, recurrent falls, she will need to be admitted for further management, also likely need PT, OT as well as TOC to discuss rehab.  Consulted hospitalist who is agreeable with the plan for admission and will evaluate the  patient.  She is admitted.  The patient is on the cardiac monitor to evaluate for evidence of arrhythmia and/or significant heart rate changes.   Clinical Course as of 01/03/24 1451  Mon Jan 03, 2024  1241 Independent review of labs, UA is not consistent with UTI, her potassium is low, will replete, she also has an AKI, her LFTs are elevated.  Mild leukopenia noted. [TT]  1417 DG Chest 1 View IMPRESSION: 1. Elevated right hemidiaphragm, right pleural effusion on CT not well demonstrated by radiograph. 2. Emphysema with bronchial thickening.   [TT]  1434 CT Head Wo Contrast IMPRESSION: 1. No acute intracranial pathology. 2. No fracture or static subluxation of the cervical spine. 3. No evidence of pulmonary embolism. 4. Evaluation of the chest is somewhat limited by dense streak artifact, however no obvious change in infiltrative soft tissue throughout the superior mediastinum encasing the aortic branch vessel origins and extending inferiorly to the subcarinal station and right hilum. 5. 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 6. No evidence of acute traumatic injury to the chest, abdomen, or pelvis. 7. Coronary artery disease.   [TT]  1435 CT ABDOMEN PELVIS W CONTRAST No biliary dilatation, no evidence of gallstones.  [TT]  1435 CT Angio Chest PE W/Cm &/Or Wo Cm . Evaluation of the chest is somewhat limited by dense streak artifact, however no obvious change in infiltrative soft tissue throughout the superior mediastinum encasing the aortic branch vessel origins and extending inferiorly to the subcarinal  station and right hilum. 5. 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  [TT]    Clinical Course User Index [TT] Waymond Lorelle Cummins, MD     FINAL CLINICAL IMPRESSION(S) / ED DIAGNOSES   Final diagnoses:  Fall, initial encounter  Weakness  AKI (acute kidney injury)  Hypokalemia  Elevated LFTs     Rx / DC Orders   ED Discharge Orders     None        Note:  This document was prepared using Dragon voice recognition software and may include unintentional dictation errors.    Waymond Lorelle Cummins, MD 01/03/24 [phone]

## 2024-01-03 NOTE — ED Triage Notes (Signed)
 Pt lives with husband at home. 2x falls this morning. Radiation finished last weak. Pt had diarrhea for a couple weeks but it stopped yesterday with imodium

## 2024-01-03 NOTE — Assessment & Plan Note (Signed)
 Significant transaminitis and T. bili of 2.4.  Patient denies any abdominal pain.  CT abdomen was negative for any acute abnormalities so ERCP was ordered by EDP -Dehydration versus disease progression with history of stage IV breast cancer with extensive metastatic disease involving mediastinum and upper abdomen. - Ordered acute hepatitis panel -ERCP pending -Monitor CMP

## 2024-01-03 NOTE — Assessment & Plan Note (Signed)
 Likely prerenal secondary to dehydration with diarrhea and continue to take torsemide . - Continue with IV fluid for another day -Monitor renal function -Avoid nephrotoxins

## 2024-01-04 ENCOUNTER — Other Ambulatory Visit: Payer: Self-pay

## 2024-01-04 DIAGNOSIS — N179 Acute kidney failure, unspecified: Secondary | ICD-10-CM | POA: Diagnosis not present

## 2024-01-04 DIAGNOSIS — W19XXXA Unspecified fall, initial encounter: Secondary | ICD-10-CM | POA: Diagnosis not present

## 2024-01-04 DIAGNOSIS — Z515 Encounter for palliative care: Secondary | ICD-10-CM | POA: Diagnosis not present

## 2024-01-04 DIAGNOSIS — R7989 Other specified abnormal findings of blood chemistry: Secondary | ICD-10-CM | POA: Diagnosis not present

## 2024-01-04 DIAGNOSIS — E876 Hypokalemia: Secondary | ICD-10-CM | POA: Diagnosis not present

## 2024-01-04 LAB — HEPATITIS PANEL, ACUTE
HCV Ab: NONREACTIVE
Hep A IgM: NONREACTIVE
Hep B C IgM: NONREACTIVE
Hepatitis B Surface Ag: NONREACTIVE

## 2024-01-04 LAB — COMPREHENSIVE METABOLIC PANEL WITH GFR
ALT: 665 U/L — ABNORMAL HIGH (ref 0–44)
AST: 154 U/L — ABNORMAL HIGH (ref 15–41)
Albumin: 3.4 g/dL — ABNORMAL LOW (ref 3.5–5.0)
Alkaline Phosphatase: 52 U/L (ref 38–126)
Anion gap: 10 (ref 5–15)
BUN: 31 mg/dL — ABNORMAL HIGH (ref 8–23)
CO2: 32 mmol/L (ref 22–32)
Calcium: 9.1 mg/dL (ref 8.9–10.3)
Chloride: 93 mmol/L — ABNORMAL LOW (ref 98–111)
Creatinine, Ser: 1.1 mg/dL — ABNORMAL HIGH (ref 0.44–1.00)
GFR, Estimated: 52 mL/min — ABNORMAL LOW (ref >60)
Glucose, Bld: 90 mg/dL (ref 70–99)
Potassium: 3.4 mmol/L — ABNORMAL LOW (ref 3.5–5.1)
Sodium: 135 mmol/L (ref 135–145)
Total Bilirubin: 2.2 mg/dL — ABNORMAL HIGH (ref 0.0–1.2)
Total Protein: 6.2 g/dL — ABNORMAL LOW (ref 6.5–8.1)

## 2024-01-04 LAB — CBC
HCT: 36.5 % (ref 36.0–46.0)
Hemoglobin: 12.5 g/dL (ref 12.0–15.0)
MCH: 32.7 pg (ref 26.0–34.0)
MCHC: 34.2 g/dL (ref 30.0–36.0)
MCV: 95.5 fL (ref 80.0–100.0)
Platelets: 156 K/uL (ref 150–400)
RBC: 3.82 MIL/uL — ABNORMAL LOW (ref 3.87–5.11)
RDW: 16.4 % — ABNORMAL HIGH (ref 11.5–15.5)
WBC: 2.9 K/uL — ABNORMAL LOW (ref 4.0–10.5)
nRBC: 0 % (ref 0.0–0.2)

## 2024-01-04 MED ORDER — POLYETHYLENE GLYCOL 3350 17 GM/SCOOP PO POWD: 17.0000 g | Freq: Every day | ORAL | Status: AC | PRN

## 2024-01-04 MED ORDER — ENSURE PLUS HIGH PROTEIN PO LIQD: 237.0000 mL | Freq: Two times a day (BID) | ORAL | 1 refills | Status: AC

## 2024-01-04 MED ORDER — TRAMADOL HCL 50 MG PO TABS: 50.0000 mg | ORAL_TABLET | Freq: Three times a day (TID) | ORAL | 0 refills | Status: AC | PRN

## 2024-01-04 MED ORDER — POTASSIUM CHLORIDE CRYS ER 20 MEQ PO TBCR
40.0000 meq | EXTENDED_RELEASE_TABLET | Freq: Once | ORAL | Status: AC
  Administered 2024-01-04: 40 meq via ORAL
  Filled 2024-01-04: qty 2

## 2024-01-04 NOTE — Progress Notes (Signed)
 MEWS Progress Note  Patient Details Name: Dana Snow MRN: [id number] DOB: Aug 12, 1945 Today's Date: 01/04/2024   MEWS Flowsheet Documentation:  Assess: MEWS Score Temp: 98.5 F (36.9 C) BP: 96/65 MAP (mmHg): 75 Pulse Rate: (!) 111 ECG Heart Rate: (!) 119 Resp: 18 Level of Consciousness: Alert SpO2: 100 % O2 Device: Room Air Assess: MEWS Score MEWS Temp: 0 MEWS Systolic: 1 MEWS Pulse: 2 MEWS RR: 0 MEWS LOC: 0 MEWS Score: 3 MEWS Score Color: Yellow Assess: SIRS CRITERIA SIRS Temperature : 0 SIRS Respirations : 0 SIRS Pulse: 1 SIRS WBC: 1 SIRS Score Sum : 2 SIRS Temperature : 0 SIRS Pulse: 1 SIRS Respirations : 0 SIRS WBC: 1 SIRS Score Sum : 2 Assess: if the MEWS score is Yellow or Red Were vital signs accurate and taken at a resting state?: Yes Does the patient meet 2 or more of the SIRS criteria?: Yes Does the patient have a confirmed or suspected source of infection?: No MEWS guidelines implemented : Yes, yellow Treat MEWS Interventions: Considered administering scheduled or prn medications/treatments as ordered Take Vital Signs Increase Vital Sign Frequency : Yellow: Q2hr x1, continue Q4hrs until patient remains green for 12hrs Escalate MEWS: Escalate: Yellow: Discuss with charge nurse and consider notifying provider and/or RRT        Lamar Satterfield 01/04/2024, 12:47 AM

## 2024-01-04 NOTE — Care Management Obs Status (Signed)
 MEDICARE OBSERVATION STATUS NOTIFICATION   Patient Details  Name: Dana Snow MRN: [id number] Date of Birth: 05/05/1945   Medicare Observation Status Notification Given:  Chaney BRANDY CHRISTIANE LELON, CMA 01/04/2024, 10:48 AM

## 2024-01-04 NOTE — Evaluation (Signed)
 Occupational Therapy Evaluation Patient Details Name: Dana Snow MRN: [id number] DOB: 18-Oct-1945 Today's Date: 01/04/2024   History of Present Illness   Pt is a 78 y.o. 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 MD assessment: Hypokalemia due to excessive gastrointestinal loss of potassium, AKI, transaminitis, sinus tachycardia.     Clinical Impressions Pt was seen for OT evaluation this date. PTA, pt resides in a 2 level home with her spouse with 5 STE. The bed/bath are on the 2nd level accessible via 16 stairs. At baseline, pt is MOD I/IND with all tasks and ambulates without AD. She utilized a RW the last few days d/t continued weakness.   Pt presents with deficits in strength, balance and activity tolerance, affecting safe and optimal ADL completion. Pt currently requires MOD I for bed mobility and supervision for STS. LB dressing performed to doff/donn socks with independence and SBA/supervision for standing UB dressing to donn her robe. She ambulated ~90 ft using RW with CGA/SBA and no LOB with mild to moderate fatigue reported. Edu on ECS, pacing, and use of DME to maximize her safety/IND and prevent overexertion. She verbalized understanding. Extensive education with daughter regarding DC plan and recommendations for private caregiver as needed when family is out of the home. He verbalized understanding.  Pt would benefit from skilled OT services to address noted impairments and functional limitations  to maximize return to PLOF. Do anticipate the need for follow up OT services upon acute hospital DC.      If plan is discharge home, recommend the following:   A little help with walking and/or transfers;A little help with bathing/dressing/bathroom;Help with stairs or ramp for entrance;Assistance  with cooking/housework;Assist for transportation     Functional Status Assessment   Patient has had a recent decline in their functional status and demonstrates the ability to make significant improvements in function in a reasonable and predictable amount of time.     Equipment Recommendations   BSC/3in1     Recommendations for Other Services         Precautions/Restrictions   Precautions Precautions: Fall Recall of Precautions/Restrictions: Intact Restrictions Weight Bearing Restrictions Per Provider Order: No     Mobility Bed Mobility Overal bed mobility: Modified Independent                  Transfers Overall transfer level: Needs assistance Equipment used: Rolling walker (2 wheels) Transfers: Sit to/from Stand Sit to Stand: Supervision           General transfer comment: ambulated ~90 ft using RW with CGA/SBA and no LOB      Balance Overall balance assessment: Needs assistance Sitting-balance support: Feet supported Sitting balance-Leahy Scale: Good     Standing balance support: Bilateral upper extremity supported, During functional activity Standing balance-Leahy Scale: Fair Standing balance comment: RW and CGA                           ADL either performed or assessed with clinical judgement   ADL Overall ADL's : Needs assistance/impaired                 Upper Body Dressing : Supervision/safety;Standing;Sitting Upper Body Dressing Details (indicate cue type and reason): to donn robe sitting/standing with unilateral support on bed rail Lower Body  Dressing: Modified independent;Sitting/lateral leans Lower Body Dressing Details (indicate cue type and reason): to doff/donn socks             Functional mobility during ADLs: Contact guard assist;Rolling walker (2 wheels)       Vision         Perception         Praxis         Pertinent Vitals/Pain Pain Assessment Pain Assessment: No/denies pain      Extremity/Trunk Assessment Upper Extremity Assessment Upper Extremity Assessment: Generalized weakness;Overall [facility] for tasks assessed   Lower Extremity Assessment Lower Extremity Assessment: Generalized weakness       Communication Communication Communication: No apparent difficulties   Cognition Arousal: Alert Behavior During Therapy: WFL for tasks assessed/performed Cognition: No apparent impairments                               Following commands: Intact       Cueing  General Comments      fatigues easily   Exercises Other Exercises Other Exercises: Edu on role of OT in acute setting and DME recommendations   Shoulder Instructions      Home Living Family/patient expects to be discharged to:: Private residence Living Arrangements: Spouse/significant other Available Help at Discharge: Family;Available PRN/intermittently (daughter lives nearby) Type of Home: House Home Access: Stairs to enter Secretary/administrator of Steps: 5 Entrance Stairs-Rails: Right;Left Home Layout: Two level Alternate Level Stairs-Number of Steps: 16 steps; unsure on rails Alternate Level Stairs-Rails: Right;Left Bathroom Shower/Tub: Producer, television/film/video: Standard     Home Equipment: Agricultural consultant (2 wheels);Shower seat - built in;Hand held shower head   Additional Comments: no bedroom on main level, but pt is willing to sleep on couch to stay on 1st level. Multiple entrances into the house with stairs, all stairs in house have at least 1 rail      Prior Functioning/Environment Prior Level of Function : Independent/Modified Independent;Needs assist;History of Falls (last six months)       Physical Assist : ADLs (physical);Mobility (physical) Mobility (physical): Gait;Stairs   Mobility Comments: at baseline, pt is IND however recently after finishing radiation she has had 2 falls and needed CGA/SBA for mobility and unable to get up/down the  steps ADLs Comments: typically IND/mod I    OT Problem List: Decreased strength;Decreased activity tolerance   OT Treatment/Interventions: Self-care/ADL training;Therapeutic exercise;Therapeutic activities;Energy conservation;DME and/or AE instruction;Patient/family education;Balance training      OT Goals(Current goals can be found in the care plan section)   Acute Rehab OT Goals Patient Stated Goal: go home OT Goal Formulation: With patient Time For Goal Achievement: 01/18/24 Potential to Achieve Goals: Good ADL Goals Pt Will Perform Lower Body Dressing: with modified independence;sitting/lateral leans;sit to/from stand Pt Will Transfer to Toilet: with modified independence;ambulating;regular height toilet Additional ADL Goal #1: Pt will demo implementation of 1 learned ECS during ADL performance to maximize safety/IND and prevent overexertion.   OT Frequency:  Min 2X/week    Co-evaluation              AM-PAC OT 6 Clicks Daily Activity     Outcome Measure Help from another person eating meals?: None Help from another person taking care of personal grooming?: None Help from another person toileting, which includes using toliet, bedpan, or urinal?: A Little Help from another person bathing (including washing, rinsing, drying)?: A Little Help from another person  to put on and taking off regular upper body clothing?: None Help from another person to put on and taking off regular lower body clothing?: A Little 6 Click Score: 21   End of Session Equipment Utilized During Treatment: Gait belt;Rolling walker (2 wheels) Nurse Communication: Mobility status  Activity Tolerance: Patient tolerated treatment well Patient left: in bed;with call bell/phone within reach;with bed alarm set;with family/visitor present  OT Visit Diagnosis: Other abnormalities of gait and mobility (R26.89);Muscle weakness (generalized) (M62.81)                Time: 8978-8944 OT Time Calculation  (min): 34 min Charges:  OT General Charges $OT Visit: 1 Visit OT Evaluation $OT Eval Moderate Complexity: 1 Mod OT Treatments $Self Care/Home Management : 8-22 mins Oluwatomisin Deman, OTR/L 01/04/24, 12:36 PM  Journei Thomassen E Shalicia Craghead 01/04/2024, 12:33 PM

## 2024-01-04 NOTE — TOC Transition Note (Signed)
 Transition of Care [facility]) - Discharge Note   Patient Details  Name: Dana Snow MRN: [id number] Date of Birth: January 01, 1946  Transition of Care [facility]) CM/SW Contact:  Racheal LITTIE Schimke, RN Phone Number: 01/04/2024, 1:39 PM   Final next level of care: Home w Home Health Services Barriers to Discharge: Barriers Resolved   Patient Goals and CMS Choice Patient states their goals for this hospitalization and ongoing recovery are:: Home with [facility] services CMS Medicare.gov Compare Post Acute Care list provided to:: Patient Represenative (must comment) (Daughter) Choice offered to / list presented to : Adult Children      Discharge Placement                    Patient and family notified of of transfer: 01/04/24  Discharge Plan and Services Additional resources added to the After Visit Summary for                            [facility] Arranged: RN, PT, OT, Nurse's Aide HH Agency: [facility] Date [facility] Agency Contacted: 01/04/24   Representative spoke with at [place] Agency: [name]   Social Drivers of Health (SDOH) Interventions SDOH Screenings   Food Insecurity: No Food Insecurity (01/03/2024)  Housing: Low Risk  (01/03/2024)  Transportation Needs: Patient Declined (01/03/2024)  Utilities: Not At Risk (01/03/2024)  Depression (PHQ2-9): Low Risk  (12/23/2023)  Recent Concern: Depression (PHQ2-9) - High Risk (12/13/2023)  Financial Resource Strain: Low Risk  (03/11/2023)   Received from [facility]  Social Connections: Patient Declined (01/03/2024)  Tobacco Use: Medium Risk (01/03/2024)     Readmission Risk Interventions     No data to display

## 2024-01-04 NOTE — Plan of Care (Signed)

## 2024-01-04 NOTE — TOC Progression Note (Signed)
 Transition of Care [facility]) - Progression Note    Patient Details  Name: Dana Snow MRN: [id number] Date of Birth: 1945-09-16  Transition of Care [facility]) CM/SW Contact  Racheal LITTIE Schimke, RN Phone Number: 01/04/2024, 11:48 AM  Clinical Narrative:  [facility] will provide [facility] services, PT/OT/RN/Aide within 24-48 hrs after discharge. Daughter at bedside and receptive to arrangements.                       Expected Discharge Plan and Services                                               Social Drivers of Health (SDOH) Interventions SDOH Screenings   Food Insecurity: No Food Insecurity (01/03/2024)  Housing: Low Risk  (01/03/2024)  Transportation Needs: Patient Declined (01/03/2024)  Utilities: Not At Risk (01/03/2024)  Depression (PHQ2-9): Low Risk  (12/23/2023)  Recent Concern: Depression (PHQ2-9) - High Risk (12/13/2023)  Financial Resource Strain: Low Risk  (03/11/2023)   Received from [facility]  Social Connections: Patient Declined (01/03/2024)  Tobacco Use: Medium Risk (01/03/2024)    Readmission Risk Interventions     No data to display

## 2024-01-04 NOTE — Consult Note (Signed)
 Palliative Medicine [facility] at [facility] Telephone:(336) [phone] Fax:(336) [phone]   Name: Dana Snow Date: 01/04/2024 MRN: [id number]  DOB: 1945-04-19  Patient Care Team: Marikay Eva POUR, PA as PCP - General (Physician Assistant) Rennie Cindy SAUNDERS, MD as Consulting Physician (Oncology)    REASON FOR CONSULTATION: Dana Snow is a 78 y.o. 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   SOCIAL HISTORY:     reports that she has quit smoking. Her smoking use included cigarettes. She has never used smokeless tobacco. She reports current alcohol use of about 2.0 standard drinks of alcohol per week. She reports that she does not use drugs.  Patient is married lives at home with her husband.  She has daughters who are involved in her care.  ADVANCE DIRECTIVES:  Not on file  CODE STATUS: Full code  PAST MEDICAL HISTORY: Past Medical History:  Diagnosis Date   Breast cancer (HCC) 2015   left lumpectomy 04/05/2014 and reexcision 04/27/2014 - radiation treatment   History of COVID-19    Hypertension    Personal history of radiation therapy 2016   Left breast    PAST SURGICAL HISTORY:  Past Surgical History:  Procedure Laterality Date   ABDOMINAL HYSTERECTOMY     BREAST BIOPSY Left 03/15/2014   8:00 us  bx, SCLEROSING ADENOSIS AND COLUMNAR CELL CHANGE    BREAST BIOPSY Left 02/27/2014   invasive lobular carcinoma LIQ   BREAST LUMPECTOMY Left 04/03/2014   [facility], DCIS, ALH, negative LN   BREAST LUMPECTOMY Left 04/27/2014   re-excision for clear margins   COMBINED HYSTEROSCOPY DIAGNOSTIC / D&C     PARTIAL MASTECTOMY WITH AXILLARY SENTINEL LYMPH NODE BIOPSY Left      HEMATOLOGY/ONCOLOGY HISTORY:  Oncology History Overview Note  #DEC 2015- LEFT  BREAST CANCER STAGE IIA [Mixed Ductal +Lobular Ca; T=2 (3.8CM); psN=0; Dr. Smith]]  ER/PR- >90%; Her 2 Nue- NEG; Lumpec & SLNBx Oncotype-RS=19 No Chemo; s/p RT [Dr.Crystal] April 2016- Arimidex; Stopped Oct 2016; START OCT 2016- Femara ; Sep 19th stopped sec to [place]; sep 19th 2019- START AROMASIN ; stop summer 2021.  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 PET scan- JULY 2025- Confluent abnormal soft tissue along the upper mediastinum. More towards the right side with involvement of the right lung hilum and separate tissue or nodes thoracic inlet on the right side adjacent to the right clavicle corresponding to the finding by prior CT scan. here are 4 hypermetabolic small bony lesions identified including thoracic spine at T1 and T2, the right acetabulum and left scapula worrisome for bony metastatic disease. A thoracic spine MRI may be of some benefit to further characterize the spine lesion. Two small nonspecific areas of asymmetric uptake along skeletal muscle of the left gluteus medius muscle and the left pectoralis muscle.   NGS-pending.   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   # BMD- Osteopenia [may 2016]; AUG 2018- T score= -1.1 [improved] ------------------------------------------------------------   DIAGNOSIS: BREAST CANCER    CURRENT/MOST RECENT THERAPY: Surveillance.    Carcinoma of lower-inner quadrant of left breast in female, estrogen receptor  positive (HCC)  04/12/2020 Cancer Staging   Staging form: Breast, AJCC 8th Edition - Clinical: Stage IB (cT2, cN0, cM0, G2, ER+, PR+, HER2-) - Signed by Rennie Cindy SAUNDERS, MD on 04/12/2020     ALLERGIES:  is allergic to percocet  [oxycodone-acetaminophen ], codeine, codone [hydrocodone], oxycodone hcl, and penicillins.  MEDICATIONS:  Current Facility-Administered Medications  Medication Dose Route Frequency Provider Last Rate Last Admin   enoxaparin  (LOVENOX ) injection 40 mg  40 mg Subcutaneous Q24H Amin, Sumayya, MD   40 mg at 01/03/24 2222   feeding supplement (ENSURE PLUS HIGH PROTEIN) liquid 237 mL  237 mL Oral BID BM Amin, Sumayya, MD   237 mL at 01/04/24 1027   lactated ringers 1,000 mL with potassium chloride 20 mEq infusion   Intravenous Continuous Amin, Sumayya, MD   Stopped at 01/04/24 9356   letrozole  (FEMARA ) tablet 2.5 mg  2.5 mg Oral Daily Amin, Sumayya, MD   2.5 mg at 01/04/24 9048   metoprolol  tartrate (LOPRESSOR ) tablet 12.5 mg  12.5 mg Oral BID Amin, Sumayya, MD   12.5 mg at 01/04/24 0950   ondansetron  (ZOFRAN ) tablet 4 mg  4 mg Oral Q6H PRN Amin, Sumayya, MD       Or   ondansetron  (ZOFRAN ) injection 4 mg  4 mg Intravenous Q6H PRN Amin, Sumayya, MD       polyethylene glycol (MIRALAX  / GLYCOLAX ) packet 17 g  17 g Oral Daily PRN Amin, Sumayya, MD       traMADol (ULTRAM) tablet 50 mg  50 mg Oral Q8H PRN Amin, Sumayya, MD        VITAL SIGNS: BP (!) 105/50 (BP Location: Right Arm)   Pulse 79   Temp 98.5 F (36.9 C)   Resp 18   Ht 5' 2 (1.575 m)   Wt 133 lb 13.1 oz (60.7 kg)   SpO2 96%   BMI 24.48 kg/m  Filed Weights   01/03/24 1700  Weight: 133 lb 13.1 oz (60.7 kg)    Estimated body mass index is 24.48 kg/m as calculated from the following:   Height as of this encounter: 5' 2 (1.575 m).   Weight as of this encounter: 133 lb 13.1 oz (60.7 kg).  LABS: CBC:    Component Value Date/Time   WBC 2.9 (L) 01/04/2024 0319   HGB 12.5 01/04/2024 0319   HGB 12.4 12/20/2023 1305   HGB 14.8 07/17/2014 1005   HCT 36.5 01/04/2024 0319   HCT 44.1 07/17/2014 1005   PLT 156 01/04/2024 0319   PLT 186 12/20/2023 1305   PLT 227 07/17/2014 1005   MCV 95.5 01/04/2024 0319   MCV 92 07/17/2014 1005    NEUTROABS 6.0 12/20/2023 1305   NEUTROABS 2.8 07/17/2014 1005   LYMPHSABS 0.3 (L) 12/20/2023 1305   LYMPHSABS 1.3 07/17/2014 1005   MONOABS 0.4 12/20/2023 1305   MONOABS 0.5 07/17/2014 1005   EOSABS 0.0 12/20/2023 1305   EOSABS 0.3 07/17/2014 1005   BASOSABS 0.0 12/20/2023 1305   BASOSABS 0.1 07/17/2014 1005   Comprehensive Metabolic Panel:    Component Value Date/Time   NA 135 01/04/2024 0319   K 3.4 (L) 01/04/2024 0319   K 3.9 03/29/2014 1402   CL 93 (L) 01/04/2024 0319   CO2 32 01/04/2024 0319   BUN 31 (H) 01/04/2024 0319   CREATININE 1.10 (H) 01/04/2024 0319   CREATININE 0.79 12/20/2023 1305   CREATININE 0.74 07/17/2014 1005   GLUCOSE 90 01/04/2024 0319   CALCIUM 9.1 01/04/2024 0319  AST 154 (H) 01/04/2024 0319   AST 26 12/13/2023 1036   ALT 665 (H) 01/04/2024 0319   ALT 34 12/13/2023 1036   ALT 16 07/17/2014 1005   ALKPHOS 52 01/04/2024 0319   ALKPHOS 70 07/17/2014 1005   BILITOT 2.2 (H) 01/04/2024 0319   BILITOT 0.9 12/13/2023 1036   PROT 6.2 (L) 01/04/2024 0319   PROT 7.4 07/17/2014 1005   ALBUMIN 3.4 (L) 01/04/2024 0319   ALBUMIN 4.4 07/17/2014 1005    RADIOGRAPHIC STUDIES: MR ABDOMEN MRCP WO CONTRAST Result Date: 01/03/2024 CLINICAL DATA:  Elevated LFTs, no abdominal pain EXAM: MRI ABDOMEN WITHOUT CONTRAST  (INCLUDING MRCP) TECHNIQUE: Multiplanar multisequence MR imaging of the abdomen was performed. Heavily T2-weighted images of the biliary and pancreatic ducts were obtained, and three-dimensional MRCP images were rendered by post processing. COMPARISON:  CT abdomen pelvis, 01/03/2024 FINDINGS: Lower chest: No acute abnormality.  Cardiomegaly. Hepatobiliary: No solid liver abnormality is seen. Gallbladder is packed with numerous tiny gallstones, not radiopaque on prior CT. No gallbladder wall thickening, or biliary dilatation. Pancreas: Unremarkable. No pancreatic ductal dilatation or surrounding inflammatory changes. Spleen: Normal in size without significant  abnormality. Adrenals/Urinary Tract: Adrenal glands are unremarkable. Kidneys are normal, without renal calculi, solid lesion, or hydronephrosis. Bladder is unremarkable. Stomach/Bowel: Stomach is within normal limits. Appendix appears normal. No evidence of bowel wall thickening, distention, or inflammatory changes. Vascular/Lymphatic: No significant vascular findings are present. No enlarged abdominal or pelvic lymph nodes. Reproductive: No mass or other significant abnormality. Other: No abdominal wall hernia or abnormality. No ascites. Musculoskeletal: No acute or significant osseous findings. IMPRESSION: 1. Gallbladder is packed with numerous tiny gallstones, not radiopaque on prior CT. No gallbladder wall thickening, choledocholithiasis, or biliary dilatation. 2. No acute noncontrast MR findings in the abdomen. 3. Cardiomegaly. Electronically Signed   By: Marolyn JONETTA Jaksch M.D.   On: 01/03/2024 22:32   CT Head Wo Contrast Result Date: 01/03/2024 CLINICAL DATA:  Frequent falls, head and neck trauma, PE suspected, dizziness, hypotension, tachycardia, elevated LFTs, history of breast cancer * Tracking Code: BO * EXAM: CT HEAD WITHOUT CONTRAST CT CERVICAL SPINE WITHOUT CONTRAST CT CHEST ANGIOGRAM WITH CONTRAST CT ABDOMEN PELVIS WITH CONTRAST TECHNIQUE: Contiguous axial images were obtained from the base of the skull through the vertex without intravenous contrast. Multidetector CT imaging of the cervical spine was performed without intravenous contrast. Multiplanar CT image reconstructions were also generated. Multidetector CT imaging of the chest was performed in the angiographic phase following bolus administration of contrast. Multi detector CT imaging of the abdomen and pelvis was performed following bolus administration of intravenous contrast. RADIATION DOSE REDUCTION: This exam was performed according to the departmental dose-optimization program which includes automated exposure control, adjustment of the  mA and/or kV according to patient size and/or use of iterative reconstruction technique. CONTRAST:  80mL OMNIPAQUE  IOHEXOL  350 MG/ML SOLN COMPARISON:  CT abdomen pelvis, 12/10/2023 CT venogram chest, 11/17/2023 FINDINGS: CT HEAD FINDINGS Brain: No evidence of acute infarction, hemorrhage, hydrocephalus, extra-axial collection or mass lesion/mass effect. Periventricular white matter hypodensity. Vascular: No hyperdense vessel or unexpected calcification. Skull: Normal. Negative for fracture or focal lesion. Sinuses/Orbits: No acute finding. Other: None. CT CERVICAL SPINE FINDINGS Alignment: Minimal degenerative anterolisthesis of C5 on C6. Skull base and vertebrae: No acute fracture. No primary bone lesion or focal pathologic process. Soft tissues and spinal canal: No prevertebral fluid or swelling. No visible canal hematoma. Disc levels:  Mild multilevel cervical disc degenerative disease. Other: None. CT CHEST ANGIOGRAM FINDINGS Cardiovascular: Satisfactory opacification  of the pulmonary arteries to the segmental level. No evidence of pulmonary embolism. Normal heart size. Left and right coronary artery calcifications. Unchanged small pericardial effusion. Scattered aortic atherosclerosis Mediastinum/Nodes: Evaluation somewhat limited by dense streak artifact, however no obvious change in infiltrative soft tissue throughout the superior mediastinum encasing the aortic branch vessel origins (series 5, image 40) and extending inferiorly to the subcarinal station and right hilum (series 5, image 61). No discretely enlarged mediastinal, hilar, or axillary lymph nodes. Thyroid  gland, trachea, and esophagus demonstrate no significant findings. Lungs/Pleura: Mild centrilobular emphysema. No pleural effusion or pneumothorax. Musculoskeletal: No chest wall abnormality. No acute osseous findings. 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 Review of the MIP images confirms the above  findings. CT ABDOMEN PELVIS FINDINGS Hepatobiliary: No solid liver abnormality is seen. No gallstones, gallbladder wall thickening, or biliary dilatation. Pancreas: Unremarkable. No pancreatic ductal dilatation or surrounding inflammatory changes. Spleen: Normal in size without significant abnormality. Adrenals/Urinary Tract: Adrenal glands are unremarkable. Kidneys are normal, without renal calculi, solid lesion, or hydronephrosis. Bladder is unremarkable. Stomach/Bowel: Stomach is within normal limits. Appendix appears normal. No evidence of bowel wall thickening, distention, or inflammatory changes. Descending and sigmoid diverticulosis. Vascular/Lymphatic: Aortic atherosclerosis. No enlarged abdominal or pelvic lymph nodes. Reproductive: Hysterectomy. Other: No abdominal wall hernia or abnormality. No ascites. Musculoskeletal: No acute or significant osseous findings. IMPRESSION: 1. No acute intracranial pathology. 2. No fracture or static subluxation of the cervical spine. 3. No evidence of pulmonary embolism. 4. Evaluation of the chest is somewhat limited by dense streak artifact, however no obvious change in infiltrative soft tissue throughout the superior mediastinum encasing the aortic branch vessel origins and extending inferiorly to the subcarinal station and right hilum. 5. 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 6. No evidence of acute traumatic injury to the chest, abdomen, or pelvis. 7. Coronary artery disease. Aortic Atherosclerosis (ICD10-I70.0) and Emphysema (ICD10-J43.9). Electronically Signed   By: Marolyn JONETTA Jaksch M.D.   On: 01/03/2024 14:31   CT Cervical Spine Wo Contrast Result Date: 01/03/2024 CLINICAL DATA:  Frequent falls, head and neck trauma, PE suspected, dizziness, hypotension, tachycardia, elevated LFTs, history of breast cancer * Tracking Code: BO * EXAM: CT HEAD WITHOUT CONTRAST CT CERVICAL SPINE WITHOUT CONTRAST CT CHEST  ANGIOGRAM WITH CONTRAST CT ABDOMEN PELVIS WITH CONTRAST TECHNIQUE: Contiguous axial images were obtained from the base of the skull through the vertex without intravenous contrast. Multidetector CT imaging of the cervical spine was performed without intravenous contrast. Multiplanar CT image reconstructions were also generated. Multidetector CT imaging of the chest was performed in the angiographic phase following bolus administration of contrast. Multi detector CT imaging of the abdomen and pelvis was performed following bolus administration of intravenous contrast. RADIATION DOSE REDUCTION: This exam was performed according to the departmental dose-optimization program which includes automated exposure control, adjustment of the mA and/or kV according to patient size and/or use of iterative reconstruction technique. CONTRAST:  80mL OMNIPAQUE  IOHEXOL  350 MG/ML SOLN COMPARISON:  CT abdomen pelvis, 12/10/2023 CT venogram chest, 11/17/2023 FINDINGS: CT HEAD FINDINGS Brain: No evidence of acute infarction, hemorrhage, hydrocephalus, extra-axial collection or mass lesion/mass effect. Periventricular white matter hypodensity. Vascular: No hyperdense vessel or unexpected calcification. Skull: Normal. Negative for fracture or focal lesion. Sinuses/Orbits: No acute finding. Other: None. CT CERVICAL SPINE FINDINGS Alignment: Minimal degenerative anterolisthesis of C5 on C6. Skull base and vertebrae: No acute fracture. No primary bone lesion or focal pathologic  process. Soft tissues and spinal canal: No prevertebral fluid or swelling. No visible canal hematoma. Disc levels:  Mild multilevel cervical disc degenerative disease. Other: None. CT CHEST ANGIOGRAM FINDINGS Cardiovascular: Satisfactory opacification of the pulmonary arteries to the segmental level. No evidence of pulmonary embolism. Normal heart size. Left and right coronary artery calcifications. Unchanged small pericardial effusion. Scattered aortic atherosclerosis  Mediastinum/Nodes: Evaluation somewhat limited by dense streak artifact, however no obvious change in infiltrative soft tissue throughout the superior mediastinum encasing the aortic branch vessel origins (series 5, image 40) and extending inferiorly to the subcarinal station and right hilum (series 5, image 61). No discretely enlarged mediastinal, hilar, or axillary lymph nodes. Thyroid  gland, trachea, and esophagus demonstrate no significant findings. Lungs/Pleura: Mild centrilobular emphysema. No pleural effusion or pneumothorax. Musculoskeletal: No chest wall abnormality. No acute osseous findings. 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 Review of the MIP images confirms the above findings. CT ABDOMEN PELVIS FINDINGS Hepatobiliary: No solid liver abnormality is seen. No gallstones, gallbladder wall thickening, or biliary dilatation. Pancreas: Unremarkable. No pancreatic ductal dilatation or surrounding inflammatory changes. Spleen: Normal in size without significant abnormality. Adrenals/Urinary Tract: Adrenal glands are unremarkable. Kidneys are normal, without renal calculi, solid lesion, or hydronephrosis. Bladder is unremarkable. Stomach/Bowel: Stomach is within normal limits. Appendix appears normal. No evidence of bowel wall thickening, distention, or inflammatory changes. Descending and sigmoid diverticulosis. Vascular/Lymphatic: Aortic atherosclerosis. No enlarged abdominal or pelvic lymph nodes. Reproductive: Hysterectomy. Other: No abdominal wall hernia or abnormality. No ascites. Musculoskeletal: No acute or significant osseous findings. IMPRESSION: 1. No acute intracranial pathology. 2. No fracture or static subluxation of the cervical spine. 3. No evidence of pulmonary embolism. 4. Evaluation of the chest is somewhat limited by dense streak artifact, however no obvious change in infiltrative soft tissue throughout the superior mediastinum encasing the  aortic branch vessel origins and extending inferiorly to the subcarinal station and right hilum. 5. 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 6. No evidence of acute traumatic injury to the chest, abdomen, or pelvis. 7. Coronary artery disease. Aortic Atherosclerosis (ICD10-I70.0) and Emphysema (ICD10-J43.9). Electronically Signed   By: Marolyn JONETTA Jaksch M.D.   On: 01/03/2024 14:31   CT ABDOMEN PELVIS W CONTRAST Result Date: 01/03/2024 CLINICAL DATA:  Frequent falls, head and neck trauma, PE suspected, dizziness, hypotension, tachycardia, elevated LFTs, history of breast cancer * Tracking Code: BO * EXAM: CT HEAD WITHOUT CONTRAST CT CERVICAL SPINE WITHOUT CONTRAST CT CHEST ANGIOGRAM WITH CONTRAST CT ABDOMEN PELVIS WITH CONTRAST TECHNIQUE: Contiguous axial images were obtained from the base of the skull through the vertex without intravenous contrast. Multidetector CT imaging of the cervical spine was performed without intravenous contrast. Multiplanar CT image reconstructions were also generated. Multidetector CT imaging of the chest was performed in the angiographic phase following bolus administration of contrast. Multi detector CT imaging of the abdomen and pelvis was performed following bolus administration of intravenous contrast. RADIATION DOSE REDUCTION: This exam was performed according to the departmental dose-optimization program which includes automated exposure control, adjustment of the mA and/or kV according to patient size and/or use of iterative reconstruction technique. CONTRAST:  80mL OMNIPAQUE  IOHEXOL  350 MG/ML SOLN COMPARISON:  CT abdomen pelvis, 12/10/2023 CT venogram chest, 11/17/2023 FINDINGS: CT HEAD FINDINGS Brain: No evidence of acute infarction, hemorrhage, hydrocephalus, extra-axial collection or mass lesion/mass effect. Periventricular white matter hypodensity. Vascular: No hyperdense vessel or unexpected calcification. Skull: Normal.  Negative for fracture or  focal lesion. Sinuses/Orbits: No acute finding. Other: None. CT CERVICAL SPINE FINDINGS Alignment: Minimal degenerative anterolisthesis of C5 on C6. Skull base and vertebrae: No acute fracture. No primary bone lesion or focal pathologic process. Soft tissues and spinal canal: No prevertebral fluid or swelling. No visible canal hematoma. Disc levels:  Mild multilevel cervical disc degenerative disease. Other: None. CT CHEST ANGIOGRAM FINDINGS Cardiovascular: Satisfactory opacification of the pulmonary arteries to the segmental level. No evidence of pulmonary embolism. Normal heart size. Left and right coronary artery calcifications. Unchanged small pericardial effusion. Scattered aortic atherosclerosis Mediastinum/Nodes: Evaluation somewhat limited by dense streak artifact, however no obvious change in infiltrative soft tissue throughout the superior mediastinum encasing the aortic branch vessel origins (series 5, image 40) and extending inferiorly to the subcarinal station and right hilum (series 5, image 61). No discretely enlarged mediastinal, hilar, or axillary lymph nodes. Thyroid  gland, trachea, and esophagus demonstrate no significant findings. Lungs/Pleura: Mild centrilobular emphysema. No pleural effusion or pneumothorax. Musculoskeletal: No chest wall abnormality. No acute osseous findings. 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 Review of the MIP images confirms the above findings. CT ABDOMEN PELVIS FINDINGS Hepatobiliary: No solid liver abnormality is seen. No gallstones, gallbladder wall thickening, or biliary dilatation. Pancreas: Unremarkable. No pancreatic ductal dilatation or surrounding inflammatory changes. Spleen: Normal in size without significant abnormality. Adrenals/Urinary Tract: Adrenal glands are unremarkable. Kidneys are normal, without renal calculi, solid lesion, or hydronephrosis. Bladder is unremarkable.  Stomach/Bowel: Stomach is within normal limits. Appendix appears normal. No evidence of bowel wall thickening, distention, or inflammatory changes. Descending and sigmoid diverticulosis. Vascular/Lymphatic: Aortic atherosclerosis. No enlarged abdominal or pelvic lymph nodes. Reproductive: Hysterectomy. Other: No abdominal wall hernia or abnormality. No ascites. Musculoskeletal: No acute or significant osseous findings. IMPRESSION: 1. No acute intracranial pathology. 2. No fracture or static subluxation of the cervical spine. 3. No evidence of pulmonary embolism. 4. Evaluation of the chest is somewhat limited by dense streak artifact, however no obvious change in infiltrative soft tissue throughout the superior mediastinum encasing the aortic branch vessel origins and extending inferiorly to the subcarinal station and right hilum. 5. 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 6. No evidence of acute traumatic injury to the chest, abdomen, or pelvis. 7. Coronary artery disease. Aortic Atherosclerosis (ICD10-I70.0) and Emphysema (ICD10-J43.9). Electronically Signed   By: Marolyn JONETTA Jaksch M.D.   On: 01/03/2024 14:31   CT Angio Chest PE W/Cm &/Or Wo Cm Result Date: 01/03/2024 CLINICAL DATA:  Frequent falls, head and neck trauma, PE suspected, dizziness, hypotension, tachycardia, elevated LFTs, history of breast cancer * Tracking Code: BO * EXAM: CT HEAD WITHOUT CONTRAST CT CERVICAL SPINE WITHOUT CONTRAST CT CHEST ANGIOGRAM WITH CONTRAST CT ABDOMEN PELVIS WITH CONTRAST TECHNIQUE: Contiguous axial images were obtained from the base of the skull through the vertex without intravenous contrast. Multidetector CT imaging of the cervical spine was performed without intravenous contrast. Multiplanar CT image reconstructions were also generated. Multidetector CT imaging of the chest was performed in the angiographic phase following bolus administration of contrast. Multi detector  CT imaging of the abdomen and pelvis was performed following bolus administration of intravenous contrast. RADIATION DOSE REDUCTION: This exam was performed according to the departmental dose-optimization program which includes automated exposure control, adjustment of the mA and/or kV according to patient size and/or use of iterative reconstruction technique. CONTRAST:  80mL OMNIPAQUE  IOHEXOL  350 MG/ML SOLN COMPARISON:  CT abdomen pelvis, 12/10/2023 CT venogram chest,  11/17/2023 FINDINGS: CT HEAD FINDINGS Brain: No evidence of acute infarction, hemorrhage, hydrocephalus, extra-axial collection or mass lesion/mass effect. Periventricular white matter hypodensity. Vascular: No hyperdense vessel or unexpected calcification. Skull: Normal. Negative for fracture or focal lesion. Sinuses/Orbits: No acute finding. Other: None. CT CERVICAL SPINE FINDINGS Alignment: Minimal degenerative anterolisthesis of C5 on C6. Skull base and vertebrae: No acute fracture. No primary bone lesion or focal pathologic process. Soft tissues and spinal canal: No prevertebral fluid or swelling. No visible canal hematoma. Disc levels:  Mild multilevel cervical disc degenerative disease. Other: None. CT CHEST ANGIOGRAM FINDINGS Cardiovascular: Satisfactory opacification of the pulmonary arteries to the segmental level. No evidence of pulmonary embolism. Normal heart size. Left and right coronary artery calcifications. Unchanged small pericardial effusion. Scattered aortic atherosclerosis Mediastinum/Nodes: Evaluation somewhat limited by dense streak artifact, however no obvious change in infiltrative soft tissue throughout the superior mediastinum encasing the aortic branch vessel origins (series 5, image 40) and extending inferiorly to the subcarinal station and right hilum (series 5, image 61). No discretely enlarged mediastinal, hilar, or axillary lymph nodes. Thyroid  gland, trachea, and esophagus demonstrate no significant findings.  Lungs/Pleura: Mild centrilobular emphysema. No pleural effusion or pneumothorax. Musculoskeletal: No chest wall abnormality. No acute osseous findings. 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 Review of the MIP images confirms the above findings. CT ABDOMEN PELVIS FINDINGS Hepatobiliary: No solid liver abnormality is seen. No gallstones, gallbladder wall thickening, or biliary dilatation. Pancreas: Unremarkable. No pancreatic ductal dilatation or surrounding inflammatory changes. Spleen: Normal in size without significant abnormality. Adrenals/Urinary Tract: Adrenal glands are unremarkable. Kidneys are normal, without renal calculi, solid lesion, or hydronephrosis. Bladder is unremarkable. Stomach/Bowel: Stomach is within normal limits. Appendix appears normal. No evidence of bowel wall thickening, distention, or inflammatory changes. Descending and sigmoid diverticulosis. Vascular/Lymphatic: Aortic atherosclerosis. No enlarged abdominal or pelvic lymph nodes. Reproductive: Hysterectomy. Other: No abdominal wall hernia or abnormality. No ascites. Musculoskeletal: No acute or significant osseous findings. IMPRESSION: 1. No acute intracranial pathology. 2. No fracture or static subluxation of the cervical spine. 3. No evidence of pulmonary embolism. 4. Evaluation of the chest is somewhat limited by dense streak artifact, however no obvious change in infiltrative soft tissue throughout the superior mediastinum encasing the aortic branch vessel origins and extending inferiorly to the subcarinal station and right hilum. 5. 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 6. No evidence of acute traumatic injury to the chest, abdomen, or pelvis. 7. Coronary artery disease. Aortic Atherosclerosis (ICD10-I70.0) and Emphysema (ICD10-J43.9). Electronically Signed   By: Marolyn JONETTA Jaksch M.D.   On: 01/03/2024 14:31   DG Chest 1 View Result  Date: 01/03/2024 CLINICAL DATA:  Shortness of breath.  Frequent falls. EXAM: CHEST  1 VIEW COMPARISON:  Chest CT 11/17/2023 FINDINGS: Elevated right hemidiaphragm, right pleural effusion on CT not well demonstrated by radiograph. Normal heart size with stable mediastinal contours. Emphysema with bronchial thickening. No pneumothorax or evidence of acute airspace disease. On limited assessment, no acute osseous findings. IMPRESSION: 1. Elevated right hemidiaphragm, right pleural effusion on CT not well demonstrated by radiograph. 2. Emphysema with bronchial thickening. Electronically Signed   By: Andrea Gasman M.D.   On: 01/03/2024 13:45   CT ABDOMEN PELVIS W CONTRAST Result Date: 12/10/2023 EXAM: CT ABDOMEN AND PELVIS WITH CONTRAST 12/10/2023 11:34:43 AM TECHNIQUE: CT of the abdomen and pelvis was performed with the administration of intravenous contrast. Multiplanar reformatted images are provided for review.  Automated exposure control, iterative reconstruction, and/or weight-based adjustment of the mA/kV was utilized to reduce the radiation dose to as low as reasonably achievable. COMPARISON: None available. CLINICAL HISTORY: Urinary retention, metastatic breast cancer. FINDINGS: LOWER CHEST: Small bilateral pleural effusions are seen at the visualized lung bases, right greater than left. Progressive collapse and consolidation within the visualized right middle lobe. Small pericardial effusion. There is extensive coronary artery calcification, largely within the left anterior descending coronary artery. Cardiac size is within normal limits. LIVER: Mild hepatic steatosis. No focal intrahepatic mass identified GALLBLADDER AND BILE DUCTS: Gallbladder is unremarkable. No biliary ductal dilatation. SPLEEN: No acute abnormality. PANCREAS: No acute abnormality. ADRENAL GLANDS: No acute abnormality. KIDNEYS, URETERS AND BLADDER: The bladder is decompressed; however, there is circumferential bladder wall thickening  and perivesicular inflammatory stranding, which may reflect superimposed infectious or inflammatory cystitis. No perivesicular fluid collections are identified. GI AND BOWEL: There is moderate sigmoid diverticulosis. There is moderate stool within the rectal vault with superimposed rectal wall thickening and perirectal inflammatory stranding and mild presacral edema, which may reflect changes of a superimposed proctitis, possibly circle proctitis. There is no evidence of obstruction. PERITONEUM AND RETROPERITONEUM: No ascites. No free air. VASCULATURE: Aorta is normal in caliber. Mild aortoiliac atherosclerotic calcification. LYMPH NODES: No lymphadenopathy. REPRODUCTIVE ORGANS: Status post hysterectomy. No adnexal masses. BONES AND SOFT TISSUES: The osseous structures are age appropriate. No focal lytic or blastic bone lesion. IMPRESSION: 1. Circumferential bladder wall thickening and perivesicular inflammatory stranding, possibly reflecting superimposed infectious or inflammatory cystitis. No perivesicular fluid collections identified. 2. Moderate sigmoid diverticulosis without evidence of diverticulitis. 3. Rectal wall thickening with perirectal inflammatory stranding and mild presacral edema, possibly reflecting superimposed proctitis. No evidence of obstruction or perforation. 4. No evidence of metastatic disease within abdomen and pelvis Electronically signed by: Dorethia Molt MD 12/10/2023 11:57 AM EDT RP Workstation: [id number]    PERFORMANCE STATUS (ECOG) : 2 - Symptomatic, <50% confined to bed  Review of Systems Unless otherwise noted, a complete review of systems is negative.  Physical Exam General: NAD Pulmonary: Unlabored Extremities: no edema, no joint deformities Skin: no rashes Neurological: Weakness but otherwise nonfocal  IMPRESSION: Patient was admitted to the hospital with weakness found to have multiple metabolic derangements including hypokalemia.  She was having diarrhea this  has improved.  She is also taking diuretics.  Labs today show improvement in AKI.  Transaminases and bilirubin are also improving.  Case discussed with Dr. Rennie who advised holding Kisqali  until patient improved.  Continue other supportive care.  I met with patient and daughter.  They are in agreement with current scope of treatment.  Patient has become significantly weak with falls at home.  Daughter is concerned that patient will not be able to return home at time of discharge and may require rehab.  Patient's husband is unable to care for her in the context of severe weakness.  PT consult pending.  PLAN: - Continue current scope of treatment - Outpatient follow-up  Case and plan discussed with Dr. Rennie  Time Total: 30 minutes  Visit consisted of counseling and education dealing with the complex and emotionally intense issues of symptom management and palliative care in the setting of serious and potentially life-threatening illness.Greater than 50%  of this time was spent counseling and coordinating care related to the above assessment and plan.  Signed by: Fonda Mower, PhD, NP-C

## 2024-01-04 NOTE — Progress Notes (Signed)
 MEWS Progress Note  Patient Details Name: Dana Snow MRN: [id number] DOB: 22-May-1945 Today's Date: 01/04/2024   MEWS Flowsheet Documentation:  Assess: MEWS Score Temp: 98.5 F (36.9 C) BP: 96/65 MAP (mmHg): 75 Pulse Rate: (!) 111 ECG Heart Rate: (!) 119 Resp: 18 Level of Consciousness: Alert SpO2: 100 % O2 Device: Room Air Assess: MEWS Score MEWS Temp: 0 MEWS Systolic: 1 MEWS Pulse: 2 MEWS RR: 0 MEWS LOC: 0 MEWS Score: 3 MEWS Score Color: Yellow Assess: SIRS CRITERIA SIRS Temperature : 0 SIRS Respirations : 0 SIRS Pulse: 1 SIRS WBC: 1 SIRS Score Sum : 2 SIRS Temperature : 0 SIRS Pulse: 1 SIRS Respirations : 0 SIRS WBC: 1 SIRS Score Sum : 2 Assess: if the MEWS score is Yellow or Red Were vital signs accurate and taken at a resting state?: No, vital signs rechecked Does the patient meet 2 or more of the SIRS criteria?: No    Vitals were taken immediately after pt ambulated to bathroom.   Will recheck vitals after pt has had time to rest.    Lamar Satterfield 01/04/2024, 12:45 AM

## 2024-01-04 NOTE — Discharge Summary (Signed)
 Physician Discharge Summary   Patient: Dana Snow MRN: [id number] DOB: [birth year]  Admit date:     01/03/2024  Discharge date: 01/04/24  Discharge Physician: Amaryllis Dare   PCP: Marikay Eva POUR, PA   Recommendations at discharge:  Please obtain CBC and CMP and follow-up Follow-up with oncology Follow-up with primary care provider  Discharge Diagnoses: Principal Problem:   Hypokalemia due to excessive gastrointestinal loss of potassium Active Problems:   AKI (acute kidney injury)   Frequent falls   Generalized weakness   Transaminitis   Sinus tachycardia   Stage IV breast cancer in female [facility])   Elevated LFTs   [facility] Course: Dana Snow is a 78 y.o. 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   Patient is currently on immunotherapy.  Had recent radiation and antibiotics.  She was also taking laxatives which resulted in diarrhea lasting for few days and resulted in progressive weakness.  Diarrhea improved since yesterday after taking Imodium.  No abdominal pain.  No nausea, vomiting or diarrhea.  Overall decreased appetite.  No other recent illness.   Patient with exertional dyspnea since the diagnosis of breast cancer recurrence in August 2025, no orthopnea or PND.  On presentation borderline soft blood pressure with tachycardia.  Saturating well on room air.  Labs with potassium of 2.8, magnesium normal, BUN 39, creatinine 1.21, AST 209, ALT 702, alkaline phosphatase 55, T. bili 2.5, UA not consistent with UTI.   Multiple imaging which include CT head, CT cervical spine, CTA chest and CT abdomen and pelvis was negative for any acute abnormality.  No pulmonary embolism.  Persistent dense soft tissue infiltrate throughout the superior  mediastinum encasing the aortic branch of vessel origin and extending inferiorly to the subcu peritoneal station and right Tildiem, also shows unchanged small osseous mets involving the posterior aspect of T2 vertebral body.  10/7:Borderline soft BP, MRCP showed Gallbladder is packed with numerous tiny gallstones, not radiopaque on prior CT. No gallbladder wall thickening, choledocholithiasis, or biliary dilatation. Some improvement in transaminitis, that can be due to her ribcociclib which is now on hold as advised by Dr. Rennie.  PT and OT recommended home health which was ordered.  Patient did not had any bowel movement for the past 2 days.  She was advised to use MiraLAX  only as needed and avoid excessive laxatives.  Patient will continue the rest of her home medications and need to have a close follow-up with her providers for further assistance.  Assessment and Plan: * Hypokalemia due to excessive gastrointestinal loss of potassium Patient with a history of recent diarrhea which has been improved.  She was also taking torsemide .  Magnesium normal Potassium was replaced  AKI (acute kidney injury) Likely prerenal secondary to dehydration with diarrhea and continue to take torsemide . Resolved with IV fluid  Frequent falls Generalized weakness.  Patient with generalized worsening weakness, having difficulty ambulation even with walker now. Multifactorial with stage IV breast cancer and superadded diarrhea. All imaging negative for any acute injury -PT and OT evaluation-recommending home health which was ordered  Transaminitis Significant transaminitis and T. bili of 2.4.  Patient denies any abdominal pain.  CT abdomen was negative for any acute abnormalities so ERCP was ordered by EDP -Dehydration versus disease progression with history of stage IV  breast cancer with extensive metastatic disease involving mediastinum and upper abdomen. - Ordered acute hepatitis panel -ERCP with  cholelithiasis but no other significant abnormality.  Likely due to her chemo which was held after discussing with her oncologist.  Some improvement in liver enzymes today. -Monitor CMP  Sinus tachycardia Likely secondary to dehydration.  Patient was also recently taken off from home metoprolol .  Metoprolol  and Cardizem both were listed in her chart, she was recently taken off by PCP. Improved.  Patient can continue with home metoprolol   Stage IV breast cancer in female [name]) Patient with history of recurrence of breast cancer with multiple mets involving bone, mediastinum causing superior vena cava syndrome. - Continue letrozole  -Patient also take Kisqali -currently on a week break, which was most likely the cause of transaminitis.  She will keep holding until seen by Dr. Rennie. -Dr. Rennie was notified at her request -Patient will get benefit from palliative care   Consultants: Palliative care, curbside oncology Procedures performed: None Disposition: Home health Diet recommendation:  Discharge Diet Orders (From admission, onward)     Start     Ordered   01/04/24 0000  Diet - low sodium heart healthy        01/04/24 1149           Regular diet DISCHARGE MEDICATION: Allergies as of 01/04/2024       Reactions   Percocet [oxycodone-acetaminophen ] Other (See Comments)   Loopy    Codeine    Codone [hydrocodone] Other (See Comments)   hyperactive   Oxycodone Hcl    Penicillins Rash        Medication List     PAUSE taking these medications    Kisqali  (400 MG Dose) 200 MG Therapy Pack Wait to take this until your doctor or other care provider tells you to start again. Generic drug: ribociclib  succ Take 2 tablets (400 mg total) by mouth daily. Take for 21 days on, 7 days off, repeat every 28 days.   torsemide  20 MG tablet Wait to take this until your doctor or other care provider tells you to start again. Commonly known as: DEMADEX  Take 20 mg by mouth in the  morning, at noon, in the evening, and at bedtime.       STOP taking these medications    ciprofloxacin  250 MG tablet Commonly known as: Cipro    diltiazem 120 MG 24 hr capsule Commonly known as: CARDIZEM CD   furosemide 20 MG tablet Commonly known as: LASIX       TAKE these medications    ascorbic acid 500 MG tablet Commonly known as: VITAMIN C Take 500 mg by mouth daily.   Calcium-Vitamin D  600-200 MG-UNIT tablet Take 1 tablet by mouth daily.   dexamethasone  2 MG tablet Commonly known as: DECADRON  Take 1 pill twice a day.  Take the second pill early in the evening.  Take with food.   feeding supplement Liqd Take 237 mLs by mouth 2 (two) times daily between meals.   letrozole  2.5 MG tablet Commonly known as: FEMARA  Take 1 tablet (2.5 mg total) by mouth daily.   magic mouthwash (nystatin, hydrocortisone, diphenhydrAMINE, lidocaine ) suspension 5 mLs 4 (four) times daily as needed for mouth pain.   metoprolol  tartrate 25 MG tablet Commonly known as: LOPRESSOR  Take 25 mg by mouth 2 (two) times daily.   ondansetron  8 MG tablet Commonly known as: ZOFRAN  One pill every 8 hours as needed for nausea/vomitting.   polyethylene glycol powder 17 GM/SCOOP powder Commonly  known as: MiraLax  Take 17 g by mouth daily as needed for mild constipation or moderate constipation. Dissolve 1 capful (17g) in 4-8 ounces of liquid and take by mouth daily. What changed:  when to take this reasons to take this   traMADol 50 MG tablet Commonly known as: ULTRAM Take 1 tablet (50 mg total) by mouth every 8 (eight) hours as needed (mild pain).               Durable Medical Equipment  (From admission, onward)           Start     Ordered   01/04/24 1152  For home use only DME Bedside commode  Once       Question:  Patient needs a bedside commode to treat with the following condition  Answer:  Generalized weakness   01/04/24 1151            Follow-up Information      Marikay Eva POUR, PA. Schedule an appointment as soon as possible for a visit in 1 week(s).   Specialty: Physician Assistant Contact information: [phone] [address] [facility] [place] [address] [phone]         Rennie Cindy SAUNDERS, MD. Schedule an appointment as soon as possible for a visit.   Specialties: Internal Medicine, Oncology Contact information: [address] [place] [address] [phone]                Discharge Exam: Dana Snow   01/03/24 1700  Weight: 60.7 kg   General.  Frail elderly lady, in no acute distress. Pulmonary.  Lungs clear bilaterally, normal respiratory effort. CV.  Regular rate and rhythm, no JVD, rub or murmur. Abdomen.  Soft, nontender, nondistended, BS positive. CNS.  Alert and oriented .  No focal neurologic deficit. Extremities.  No edema, no cyanosis, pulses intact and symmetrical. Psychiatry.  Judgment and insight appears normal.   Condition at discharge: stable  The results of significant diagnostics from this hospitalization (including imaging, microbiology, ancillary and laboratory) are listed below for reference.   Imaging Studies: MR ABDOMEN MRCP WO CONTRAST Result Date: 01/03/2024 CLINICAL DATA:  Elevated LFTs, no abdominal pain EXAM: MRI ABDOMEN WITHOUT CONTRAST  (INCLUDING MRCP) TECHNIQUE: Multiplanar multisequence MR imaging of the abdomen was performed. Heavily T2-weighted images of the biliary and pancreatic ducts were obtained, and three-dimensional MRCP images were rendered by post processing. COMPARISON:  CT abdomen pelvis, 01/03/2024 FINDINGS: Lower chest: No acute abnormality.  Cardiomegaly. Hepatobiliary: No solid liver abnormality is seen. Gallbladder is packed with numerous tiny gallstones, not radiopaque on prior CT. No gallbladder wall thickening, or biliary dilatation. Pancreas: Unremarkable. No pancreatic ductal dilatation or surrounding inflammatory changes. Spleen: Normal in  size without significant abnormality. Adrenals/Urinary Tract: Adrenal glands are unremarkable. Kidneys are normal, without renal calculi, solid lesion, or hydronephrosis. Bladder is unremarkable. Stomach/Bowel: Stomach is within normal limits. Appendix appears normal. No evidence of bowel wall thickening, distention, or inflammatory changes. Vascular/Lymphatic: No significant vascular findings are present. No enlarged abdominal or pelvic lymph nodes. Reproductive: No mass or other significant abnormality. Other: No abdominal wall hernia or abnormality. No ascites. Musculoskeletal: No acute or significant osseous findings. IMPRESSION: 1. Gallbladder is packed with numerous tiny gallstones, not radiopaque on prior CT. No gallbladder wall thickening, choledocholithiasis, or biliary dilatation. 2. No acute noncontrast MR findings in the abdomen. 3. Cardiomegaly. Electronically Signed   By: Marolyn JONETTA Jaksch M.D.   On: 01/03/2024 22:32   CT Head Wo Contrast  Result Date: 01/03/2024 CLINICAL DATA:  Frequent falls, head and neck trauma, PE suspected, dizziness, hypotension, tachycardia, elevated LFTs, history of breast cancer * Tracking Code: BO * EXAM: CT HEAD WITHOUT CONTRAST CT CERVICAL SPINE WITHOUT CONTRAST CT CHEST ANGIOGRAM WITH CONTRAST CT ABDOMEN PELVIS WITH CONTRAST TECHNIQUE: Contiguous axial images were obtained from the base of the skull through the vertex without intravenous contrast. Multidetector CT imaging of the cervical spine was performed without intravenous contrast. Multiplanar CT image reconstructions were also generated. Multidetector CT imaging of the chest was performed in the angiographic phase following bolus administration of contrast. Multi detector CT imaging of the abdomen and pelvis was performed following bolus administration of intravenous contrast. RADIATION DOSE REDUCTION: This exam was performed according to the departmental dose-optimization program which includes automated exposure  control, adjustment of the mA and/or kV according to patient size and/or use of iterative reconstruction technique. CONTRAST:  80mL OMNIPAQUE  IOHEXOL  350 MG/ML SOLN COMPARISON:  CT abdomen pelvis, 12/10/2023 CT venogram chest, 11/17/2023 FINDINGS: CT HEAD FINDINGS Brain: No evidence of acute infarction, hemorrhage, hydrocephalus, extra-axial collection or mass lesion/mass effect. Periventricular white matter hypodensity. Vascular: No hyperdense vessel or unexpected calcification. Skull: Normal. Negative for fracture or focal lesion. Sinuses/Orbits: No acute finding. Other: None. CT CERVICAL SPINE FINDINGS Alignment: Minimal degenerative anterolisthesis of C5 on C6. Skull base and vertebrae: No acute fracture. No primary bone lesion or focal pathologic process. Soft tissues and spinal canal: No prevertebral fluid or swelling. No visible canal hematoma. Disc levels:  Mild multilevel cervical disc degenerative disease. Other: None. CT CHEST ANGIOGRAM FINDINGS Cardiovascular: Satisfactory opacification of the pulmonary arteries to the segmental level. No evidence of pulmonary embolism. Normal heart size. Left and right coronary artery calcifications. Unchanged small pericardial effusion. Scattered aortic atherosclerosis Mediastinum/Nodes: Evaluation somewhat limited by dense streak artifact, however no obvious change in infiltrative soft tissue throughout the superior mediastinum encasing the aortic branch vessel origins (series 5, image 40) and extending inferiorly to the subcarinal station and right hilum (series 5, image 61). No discretely enlarged mediastinal, hilar, or axillary lymph nodes. Thyroid  gland, trachea, and esophagus demonstrate no significant findings. Lungs/Pleura: Mild centrilobular emphysema. No pleural effusion or pneumothorax. Musculoskeletal: No chest wall abnormality. No acute osseous findings. 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 Review of the MIP  images confirms the above findings. CT ABDOMEN PELVIS FINDINGS Hepatobiliary: No solid liver abnormality is seen. No gallstones, gallbladder wall thickening, or biliary dilatation. Pancreas: Unremarkable. No pancreatic ductal dilatation or surrounding inflammatory changes. Spleen: Normal in size without significant abnormality. Adrenals/Urinary Tract: Adrenal glands are unremarkable. Kidneys are normal, without renal calculi, solid lesion, or hydronephrosis. Bladder is unremarkable. Stomach/Bowel: Stomach is within normal limits. Appendix appears normal. No evidence of bowel wall thickening, distention, or inflammatory changes. Descending and sigmoid diverticulosis. Vascular/Lymphatic: Aortic atherosclerosis. No enlarged abdominal or pelvic lymph nodes. Reproductive: Hysterectomy. Other: No abdominal wall hernia or abnormality. No ascites. Musculoskeletal: No acute or significant osseous findings. IMPRESSION: 1. No acute intracranial pathology. 2. No fracture or static subluxation of the cervical spine. 3. No evidence of pulmonary embolism. 4. Evaluation of the chest is somewhat limited by dense streak artifact, however no obvious change in infiltrative soft tissue throughout the superior mediastinum encasing the aortic branch vessel origins and extending inferiorly to the subcarinal station and right hilum. 5. 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 6. No evidence of acute traumatic injury to the chest, abdomen,  or pelvis. 7. Coronary artery disease. Aortic Atherosclerosis (ICD10-I70.0) and Emphysema (ICD10-J43.9). Electronically Signed   By: Marolyn JONETTA Jaksch M.D.   On: 01/03/2024 14:31   CT Cervical Spine Wo Contrast Result Date: 01/03/2024 CLINICAL DATA:  Frequent falls, head and neck trauma, PE suspected, dizziness, hypotension, tachycardia, elevated LFTs, history of breast cancer * Tracking Code: BO * EXAM: CT HEAD WITHOUT CONTRAST CT CERVICAL SPINE WITHOUT  CONTRAST CT CHEST ANGIOGRAM WITH CONTRAST CT ABDOMEN PELVIS WITH CONTRAST TECHNIQUE: Contiguous axial images were obtained from the base of the skull through the vertex without intravenous contrast. Multidetector CT imaging of the cervical spine was performed without intravenous contrast. Multiplanar CT image reconstructions were also generated. Multidetector CT imaging of the chest was performed in the angiographic phase following bolus administration of contrast. Multi detector CT imaging of the abdomen and pelvis was performed following bolus administration of intravenous contrast. RADIATION DOSE REDUCTION: This exam was performed according to the departmental dose-optimization program which includes automated exposure control, adjustment of the mA and/or kV according to patient size and/or use of iterative reconstruction technique. CONTRAST:  80mL OMNIPAQUE  IOHEXOL  350 MG/ML SOLN COMPARISON:  CT abdomen pelvis, 12/10/2023 CT venogram chest, 11/17/2023 FINDINGS: CT HEAD FINDINGS Brain: No evidence of acute infarction, hemorrhage, hydrocephalus, extra-axial collection or mass lesion/mass effect. Periventricular white matter hypodensity. Vascular: No hyperdense vessel or unexpected calcification. Skull: Normal. Negative for fracture or focal lesion. Sinuses/Orbits: No acute finding. Other: None. CT CERVICAL SPINE FINDINGS Alignment: Minimal degenerative anterolisthesis of C5 on C6. Skull base and vertebrae: No acute fracture. No primary bone lesion or focal pathologic process. Soft tissues and spinal canal: No prevertebral fluid or swelling. No visible canal hematoma. Disc levels:  Mild multilevel cervical disc degenerative disease. Other: None. CT CHEST ANGIOGRAM FINDINGS Cardiovascular: Satisfactory opacification of the pulmonary arteries to the segmental level. No evidence of pulmonary embolism. Normal heart size. Left and right coronary artery calcifications. Unchanged small pericardial effusion. Scattered  aortic atherosclerosis Mediastinum/Nodes: Evaluation somewhat limited by dense streak artifact, however no obvious change in infiltrative soft tissue throughout the superior mediastinum encasing the aortic branch vessel origins (series 5, image 40) and extending inferiorly to the subcarinal station and right hilum (series 5, image 61). No discretely enlarged mediastinal, hilar, or axillary lymph nodes. Thyroid  gland, trachea, and esophagus demonstrate no significant findings. Lungs/Pleura: Mild centrilobular emphysema. No pleural effusion or pneumothorax. Musculoskeletal: No chest wall abnormality. No acute osseous findings. 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 Review of the MIP images confirms the above findings. CT ABDOMEN PELVIS FINDINGS Hepatobiliary: No solid liver abnormality is seen. No gallstones, gallbladder wall thickening, or biliary dilatation. Pancreas: Unremarkable. No pancreatic ductal dilatation or surrounding inflammatory changes. Spleen: Normal in size without significant abnormality. Adrenals/Urinary Tract: Adrenal glands are unremarkable. Kidneys are normal, without renal calculi, solid lesion, or hydronephrosis. Bladder is unremarkable. Stomach/Bowel: Stomach is within normal limits. Appendix appears normal. No evidence of bowel wall thickening, distention, or inflammatory changes. Descending and sigmoid diverticulosis. Vascular/Lymphatic: Aortic atherosclerosis. No enlarged abdominal or pelvic lymph nodes. Reproductive: Hysterectomy. Other: No abdominal wall hernia or abnormality. No ascites. Musculoskeletal: No acute or significant osseous findings. IMPRESSION: 1. No acute intracranial pathology. 2. No fracture or static subluxation of the cervical spine. 3. No evidence of pulmonary embolism. 4. Evaluation of the chest is somewhat limited by dense streak artifact, however no obvious change in infiltrative soft tissue throughout the superior  mediastinum encasing the aortic branch vessel origins and extending  inferiorly to the subcarinal station and right hilum. 5. 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 6. No evidence of acute traumatic injury to the chest, abdomen, or pelvis. 7. Coronary artery disease. Aortic Atherosclerosis (ICD10-I70.0) and Emphysema (ICD10-J43.9). Electronically Signed   By: Marolyn JONETTA Jaksch M.D.   On: 01/03/2024 14:31   CT ABDOMEN PELVIS W CONTRAST Result Date: 01/03/2024 CLINICAL DATA:  Frequent falls, head and neck trauma, PE suspected, dizziness, hypotension, tachycardia, elevated LFTs, history of breast cancer * Tracking Code: BO * EXAM: CT HEAD WITHOUT CONTRAST CT CERVICAL SPINE WITHOUT CONTRAST CT CHEST ANGIOGRAM WITH CONTRAST CT ABDOMEN PELVIS WITH CONTRAST TECHNIQUE: Contiguous axial images were obtained from the base of the skull through the vertex without intravenous contrast. Multidetector CT imaging of the cervical spine was performed without intravenous contrast. Multiplanar CT image reconstructions were also generated. Multidetector CT imaging of the chest was performed in the angiographic phase following bolus administration of contrast. Multi detector CT imaging of the abdomen and pelvis was performed following bolus administration of intravenous contrast. RADIATION DOSE REDUCTION: This exam was performed according to the departmental dose-optimization program which includes automated exposure control, adjustment of the mA and/or kV according to patient size and/or use of iterative reconstruction technique. CONTRAST:  80mL OMNIPAQUE  IOHEXOL  350 MG/ML SOLN COMPARISON:  CT abdomen pelvis, 12/10/2023 CT venogram chest, 11/17/2023 FINDINGS: CT HEAD FINDINGS Brain: No evidence of acute infarction, hemorrhage, hydrocephalus, extra-axial collection or mass lesion/mass effect. Periventricular white matter hypodensity. Vascular: No hyperdense vessel or unexpected  calcification. Skull: Normal. Negative for fracture or focal lesion. Sinuses/Orbits: No acute finding. Other: None. CT CERVICAL SPINE FINDINGS Alignment: Minimal degenerative anterolisthesis of C5 on C6. Skull base and vertebrae: No acute fracture. No primary bone lesion or focal pathologic process. Soft tissues and spinal canal: No prevertebral fluid or swelling. No visible canal hematoma. Disc levels:  Mild multilevel cervical disc degenerative disease. Other: None. CT CHEST ANGIOGRAM FINDINGS Cardiovascular: Satisfactory opacification of the pulmonary arteries to the segmental level. No evidence of pulmonary embolism. Normal heart size. Left and right coronary artery calcifications. Unchanged small pericardial effusion. Scattered aortic atherosclerosis Mediastinum/Nodes: Evaluation somewhat limited by dense streak artifact, however no obvious change in infiltrative soft tissue throughout the superior mediastinum encasing the aortic branch vessel origins (series 5, image 40) and extending inferiorly to the subcarinal station and right hilum (series 5, image 61). No discretely enlarged mediastinal, hilar, or axillary lymph nodes. Thyroid  gland, trachea, and esophagus demonstrate no significant findings. Lungs/Pleura: Mild centrilobular emphysema. No pleural effusion or pneumothorax. Musculoskeletal: No chest wall abnormality. No acute osseous findings. 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 Review of the MIP images confirms the above findings. CT ABDOMEN PELVIS FINDINGS Hepatobiliary: No solid liver abnormality is seen. No gallstones, gallbladder wall thickening, or biliary dilatation. Pancreas: Unremarkable. No pancreatic ductal dilatation or surrounding inflammatory changes. Spleen: Normal in size without significant abnormality. Adrenals/Urinary Tract: Adrenal glands are unremarkable. Kidneys are normal, without renal calculi, solid lesion, or hydronephrosis. Bladder  is unremarkable. Stomach/Bowel: Stomach is within normal limits. Appendix appears normal. No evidence of bowel wall thickening, distention, or inflammatory changes. Descending and sigmoid diverticulosis. Vascular/Lymphatic: Aortic atherosclerosis. No enlarged abdominal or pelvic lymph nodes. Reproductive: Hysterectomy. Other: No abdominal wall hernia or abnormality. No ascites. Musculoskeletal: No acute or significant osseous findings. IMPRESSION: 1. No acute intracranial pathology. 2. No fracture or static subluxation of the cervical spine. 3. No evidence of pulmonary  embolism. 4. Evaluation of the chest is somewhat limited by dense streak artifact, however no obvious change in infiltrative soft tissue throughout the superior mediastinum encasing the aortic branch vessel origins and extending inferiorly to the subcarinal station and right hilum. 5. 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 6. No evidence of acute traumatic injury to the chest, abdomen, or pelvis. 7. Coronary artery disease. Aortic Atherosclerosis (ICD10-I70.0) and Emphysema (ICD10-J43.9). Electronically Signed   By: Marolyn JONETTA Jaksch M.D.   On: 01/03/2024 14:31   CT Angio Chest PE W/Cm &/Or Wo Cm Result Date: 01/03/2024 CLINICAL DATA:  Frequent falls, head and neck trauma, PE suspected, dizziness, hypotension, tachycardia, elevated LFTs, history of breast cancer * Tracking Code: BO * EXAM: CT HEAD WITHOUT CONTRAST CT CERVICAL SPINE WITHOUT CONTRAST CT CHEST ANGIOGRAM WITH CONTRAST CT ABDOMEN PELVIS WITH CONTRAST TECHNIQUE: Contiguous axial images were obtained from the base of the skull through the vertex without intravenous contrast. Multidetector CT imaging of the cervical spine was performed without intravenous contrast. Multiplanar CT image reconstructions were also generated. Multidetector CT imaging of the chest was performed in the angiographic phase following bolus administration of  contrast. Multi detector CT imaging of the abdomen and pelvis was performed following bolus administration of intravenous contrast. RADIATION DOSE REDUCTION: This exam was performed according to the departmental dose-optimization program which includes automated exposure control, adjustment of the mA and/or kV according to patient size and/or use of iterative reconstruction technique. CONTRAST:  80mL OMNIPAQUE  IOHEXOL  350 MG/ML SOLN COMPARISON:  CT abdomen pelvis, 12/10/2023 CT venogram chest, 11/17/2023 FINDINGS: CT HEAD FINDINGS Brain: No evidence of acute infarction, hemorrhage, hydrocephalus, extra-axial collection or mass lesion/mass effect. Periventricular white matter hypodensity. Vascular: No hyperdense vessel or unexpected calcification. Skull: Normal. Negative for fracture or focal lesion. Sinuses/Orbits: No acute finding. Other: None. CT CERVICAL SPINE FINDINGS Alignment: Minimal degenerative anterolisthesis of C5 on C6. Skull base and vertebrae: No acute fracture. No primary bone lesion or focal pathologic process. Soft tissues and spinal canal: No prevertebral fluid or swelling. No visible canal hematoma. Disc levels:  Mild multilevel cervical disc degenerative disease. Other: None. CT CHEST ANGIOGRAM FINDINGS Cardiovascular: Satisfactory opacification of the pulmonary arteries to the segmental level. No evidence of pulmonary embolism. Normal heart size. Left and right coronary artery calcifications. Unchanged small pericardial effusion. Scattered aortic atherosclerosis Mediastinum/Nodes: Evaluation somewhat limited by dense streak artifact, however no obvious change in infiltrative soft tissue throughout the superior mediastinum encasing the aortic branch vessel origins (series 5, image 40) and extending inferiorly to the subcarinal station and right hilum (series 5, image 61). No discretely enlarged mediastinal, hilar, or axillary lymph nodes. Thyroid  gland, trachea, and esophagus demonstrate no  significant findings. Lungs/Pleura: Mild centrilobular emphysema. No pleural effusion or pneumothorax. Musculoskeletal: No chest wall abnormality. No acute osseous findings. 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 Review of the MIP images confirms the above findings. CT ABDOMEN PELVIS FINDINGS Hepatobiliary: No solid liver abnormality is seen. No gallstones, gallbladder wall thickening, or biliary dilatation. Pancreas: Unremarkable. No pancreatic ductal dilatation or surrounding inflammatory changes. Spleen: Normal in size without significant abnormality. Adrenals/Urinary Tract: Adrenal glands are unremarkable. Kidneys are normal, without renal calculi, solid lesion, or hydronephrosis. Bladder is unremarkable. Stomach/Bowel: Stomach is within normal limits. Appendix appears normal. No evidence of bowel wall thickening, distention, or inflammatory changes. Descending and sigmoid diverticulosis. Vascular/Lymphatic: Aortic atherosclerosis. No enlarged abdominal or pelvic lymph nodes. Reproductive: Hysterectomy. Other:  No abdominal wall hernia or abnormality. No ascites. Musculoskeletal: No acute or significant osseous findings. IMPRESSION: 1. No acute intracranial pathology. 2. No fracture or static subluxation of the cervical spine. 3. No evidence of pulmonary embolism. 4. Evaluation of the chest is somewhat limited by dense streak artifact, however no obvious change in infiltrative soft tissue throughout the superior mediastinum encasing the aortic branch vessel origins and extending inferiorly to the subcarinal station and right hilum. 5. 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 6. No evidence of acute traumatic injury to the chest, abdomen, or pelvis. 7. Coronary artery disease. Aortic Atherosclerosis (ICD10-I70.0) and Emphysema (ICD10-J43.9). Electronically Signed   By: Marolyn JONETTA Jaksch M.D.   On: 01/03/2024 14:31   DG  Chest 1 View Result Date: 01/03/2024 CLINICAL DATA:  Shortness of breath.  Frequent falls. EXAM: CHEST  1 VIEW COMPARISON:  Chest CT 11/17/2023 FINDINGS: Elevated right hemidiaphragm, right pleural effusion on CT not well demonstrated by radiograph. Normal heart size with stable mediastinal contours. Emphysema with bronchial thickening. No pneumothorax or evidence of acute airspace disease. On limited assessment, no acute osseous findings. IMPRESSION: 1. Elevated right hemidiaphragm, right pleural effusion on CT not well demonstrated by radiograph. 2. Emphysema with bronchial thickening. Electronically Signed   By: Andrea Gasman M.D.   On: 01/03/2024 13:45   CT ABDOMEN PELVIS W CONTRAST Result Date: 12/10/2023 EXAM: CT ABDOMEN AND PELVIS WITH CONTRAST 12/10/2023 11:34:43 AM TECHNIQUE: CT of the abdomen and pelvis was performed with the administration of intravenous contrast. Multiplanar reformatted images are provided for review. Automated exposure control, iterative reconstruction, and/or weight-based adjustment of the mA/kV was utilized to reduce the radiation dose to as low as reasonably achievable. COMPARISON: None available. CLINICAL HISTORY: Urinary retention, metastatic breast cancer. FINDINGS: LOWER CHEST: Small bilateral pleural effusions are seen at the visualized lung bases, right greater than left. Progressive collapse and consolidation within the visualized right middle lobe. Small pericardial effusion. There is extensive coronary artery calcification, largely within the left anterior descending coronary artery. Cardiac size is within normal limits. LIVER: Mild hepatic steatosis. No focal intrahepatic mass identified GALLBLADDER AND BILE DUCTS: Gallbladder is unremarkable. No biliary ductal dilatation. SPLEEN: No acute abnormality. PANCREAS: No acute abnormality. ADRENAL GLANDS: No acute abnormality. KIDNEYS, URETERS AND BLADDER: The bladder is decompressed; however, there is circumferential  bladder wall thickening and perivesicular inflammatory stranding, which may reflect superimposed infectious or inflammatory cystitis. No perivesicular fluid collections are identified. GI AND BOWEL: There is moderate sigmoid diverticulosis. There is moderate stool within the rectal vault with superimposed rectal wall thickening and perirectal inflammatory stranding and mild presacral edema, which may reflect changes of a superimposed proctitis, possibly circle proctitis. There is no evidence of obstruction. PERITONEUM AND RETROPERITONEUM: No ascites. No free air. VASCULATURE: Aorta is normal in caliber. Mild aortoiliac atherosclerotic calcification. LYMPH NODES: No lymphadenopathy. REPRODUCTIVE ORGANS: Status post hysterectomy. No adnexal masses. BONES AND SOFT TISSUES: The osseous structures are age appropriate. No focal lytic or blastic bone lesion. IMPRESSION: 1. Circumferential bladder wall thickening and perivesicular inflammatory stranding, possibly reflecting superimposed infectious or inflammatory cystitis. No perivesicular fluid collections identified. 2. Moderate sigmoid diverticulosis without evidence of diverticulitis. 3. Rectal wall thickening with perirectal inflammatory stranding and mild presacral edema, possibly reflecting superimposed proctitis. No evidence of obstruction or perforation. 4. No evidence of metastatic disease within abdomen and pelvis Electronically signed by: Dorethia Molt MD 12/10/2023 11:57 AM EDT RP Workstation: [id number]    Microbiology: Results for  orders placed or performed during the hospital encounter of 12/09/23  Urine Culture     Status: Abnormal   Collection Time: 12/09/23  9:46 AM   Specimen: Urine, Clean Catch  Result Value Ref Range Status   Specimen Description   Final    URINE, CLEAN CATCH Performed at [facility], [address] [place], [place] [address]    Special Requests   Final    NONE Performed at [facility], [address] [place], [place] [address]    Culture >=100,000 COLONIES/mL ENTEROCOCCUS FAECALIS (A)  Final   Report Status 12/11/2023 FINAL  Final   Organism ID, Bacteria ENTEROCOCCUS FAECALIS (A)  Final      Susceptibility   Enterococcus faecalis - MIC*    AMPICILLIN  <=2 SENSITIVE Sensitive     NITROFURANTOIN <=16 SENSITIVE Sensitive     VANCOMYCIN 1 SENSITIVE Sensitive     * >=100,000 COLONIES/mL ENTEROCOCCUS FAECALIS    Labs: CBC: Recent Labs  Lab 01/03/24 1034 01/04/24 0319  WBC 3.8* 2.9*  HGB 14.8 12.5  HCT 43.7 36.5  MCV 96.0 95.5  PLT 183 156   Basic Metabolic Panel: Recent Labs  Lab 01/03/24 1034 01/03/24 1337 01/04/24 0319  NA 136  --  135  K 2.8*  --  3.4*  CL 87*  --  93*  CO2 28  --  32  GLUCOSE 131*  --  90  BUN 39*  --  31*  CREATININE 1.21*  --  1.10*  CALCIUM 9.8  --  9.1  MG  --  2.6*  --    Liver Function Tests: Recent Labs  Lab 01/03/24 1034 01/04/24 0319  AST 209* 154*  ALT 702* 665*  ALKPHOS 55 52  BILITOT 2.5* 2.2*  PROT 7.1 6.2*  ALBUMIN 3.8 3.4*   CBG: No results for input(s): GLUCAP in the last 168 hours.  Discharge time spent: greater than 30 minutes.  This record has been created using Conservation officer, historic buildings. Errors have been sought and corrected,but may not always be located. Such creation errors do not reflect on the standard of care.   Signed: Amaryllis Dare, MD [facility] 01/04/2024

## 2024-01-04 NOTE — Hospital Course (Addendum)
 Dana Snow is a 78 y.o. 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   Patient is currently on immunotherapy.  Had recent radiation and antibiotics.  She was also taking laxatives which resulted in diarrhea lasting for few days and resulted in progressive weakness.  Diarrhea improved since yesterday after taking Imodium.  No abdominal pain.  No nausea, vomiting or diarrhea.  Overall decreased appetite.  No other recent illness.   Patient with exertional dyspnea since the diagnosis of breast cancer recurrence in August 2025, no orthopnea or PND.  On presentation borderline soft blood pressure with tachycardia.  Saturating well on room air.  Labs with potassium of 2.8, magnesium normal, BUN 39, creatinine 1.21, AST 209, ALT 702, alkaline phosphatase 55, T. bili 2.5, UA not consistent with UTI.   Multiple imaging which include CT head, CT cervical spine, CTA chest and CT abdomen and pelvis was negative for any acute abnormality.  No pulmonary embolism.  Persistent dense soft tissue infiltrate throughout the superior mediastinum encasing the aortic branch of vessel origin and extending inferiorly to the subcu peritoneal station and right Tildiem, also shows unchanged small osseous mets involving the posterior aspect of T2 vertebral body.  10/7:Borderline soft BP, MRCP showed Gallbladder is packed with numerous tiny gallstones, not radiopaque on prior CT. No gallbladder wall thickening, choledocholithiasis, or biliary dilatation. Some improvement in transaminitis, that can be due to her ribcociclib which is now on hold as advised by Dr. Rennie.  PT and OT recommended home health which was ordered.  Patient did not had any bowel movement for the past 2 days.  She was advised to use  MiraLAX  only as needed and avoid excessive laxatives.  Patient will continue the rest of her home medications and need to have a close follow-up with her providers for further assistance.

## 2024-01-04 NOTE — Progress Notes (Signed)
 Mobility Specialist Progress Note:    01/04/24 0949  Mobility  Activity Ambulated with assistance  Level of Assistance Contact guard assist, steadying assist  Assistive Device Front wheel walker  Distance Ambulated (ft) 20 ft  Range of Motion/Exercises Active;All extremities  Activity Response Tolerated well  Mobility visit 1 Mobility  Mobility Specialist Start Time (ACUTE ONLY) 0919  Mobility Specialist Stop Time (ACUTE ONLY) 0934  Mobility Specialist Time Calculation (min) (ACUTE ONLY) 15 min   Pt received requesting assistance, daughter in room. Required CGA to stand and ambulate with RW. Tolerated well, experienced some LOB upon standing for peri care. Returned supine, alarm on. All needs met.  Sherrilee Ditty Mobility Specialist Please contact via Special educational needs teacher or  Rehab office at [phone]

## 2024-01-04 NOTE — Evaluation (Signed)
 Physical Therapy Evaluation Patient Details Name: Cahterine Heinzel MRN: [id number] DOB: 08-23-1945 Today's Date: 01/04/2024  History of Present Illness  Pt is a 78 y.o. 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 MD assessment: Hypokalemia due to excessive gastrointestinal loss of potassium, AKI, transaminitis, sinus tachycardia.   Clinical Impression  Pt A&Ox4, pleasant and agreeable to participate in PT evaluation. At baseline, pt reports being IND with mobility/basic ADLs, no previous AD use, multiple recent falls including 2 the day of her admission. Pt was met semi-supine in bed, supervision for bed mobility this date. Pt able to perform multiple STS from various surfaces with CGA, mod VC for safe hand placement on RW. Pt amb ~100 ft with no LOB, decreased stride length bilaterally and min VC to maintain RW in good positioning to allow for improved upright posture. Pt able to navigate set of 4 stairs twice with seated rest break between, no LOB, max verbal and visual cues for step-to sequencing and hand placement on unilateral hand rail. HR and SpO2 monitored during session and remained WFL throughout. Pt was left long-sitting in bed at end of session, all needs in reach, daughter at bedside. Pt is currently displaying deficits in activity tolerance, balance, and strength limiting her overall functional mobility, would benefit from skilled PT intervention to address deficits and allow for safe return to PLOF.       If plan is discharge home, recommend the following: A little help with walking and/or transfers;A little help with bathing/dressing/bathroom;Assistance with cooking/housework;Assist for transportation;Help with stairs or ramp for entrance   Can travel by private vehicle        Equipment  Recommendations BSC/3in1  Recommendations for Other Services       Functional Status Assessment Patient has had a recent decline in their functional status and demonstrates the ability to make significant improvements in function in a reasonable and predictable amount of time.     Precautions / Restrictions Precautions Precautions: Fall Recall of Precautions/Restrictions: Intact Restrictions Weight Bearing Restrictions Per Provider Order: No      Mobility  Bed Mobility Overal bed mobility: Needs Assistance Bed Mobility: Supine to Sit, Sit to Supine     Supine to sit: Supervision, HOB elevated, Used rails Sit to supine: Supervision, Used rails   General bed mobility comments: no physical assistance for bed mobility, use of bed features    Transfers Overall transfer level: Needs assistance Equipment used: Rolling walker (2 wheels) Transfers: Sit to/from Stand Sit to Stand: Contact guard assist           General transfer comment: 2 STs from EOB, 2 from [facility] with RW and CGA, max VC for hand placement on RW    Ambulation/Gait Ambulation/Gait assistance: Contact guard assist Gait Distance (Feet): 100 Feet Assistive device: Rolling walker (2 wheels) Gait Pattern/deviations: Step-through pattern, Decreased stride length, Narrow base of support Gait velocity: decreased     General Gait Details: no LOB throughout, mod VC for proper RW positioning to allow for better upright posture  Stairs Stairs: Yes Stairs assistance: Contact guard assist Stair Management: One rail Right, Step to pattern, Forwards Number of Stairs: 4 General stair comments: able to navigate set of 4 stairs twice with seated rest break between bouts. max VC for step sequencing and use of both hands on one hand  rail for BUE support, no LOB  Wheelchair Mobility     Tilt Bed    Modified Rankin (Stroke Patients Only)       Balance Overall balance assessment: Needs assistance Sitting-balance  support: Feet supported Sitting balance-Leahy Scale: Good Sitting balance - Comments: steady static and dynamic sitting   Standing balance support: Bilateral upper extremity supported, No upper extremity supported Standing balance-Leahy Scale: Fair Standing balance comment: increased postural sway with no UE support                             Pertinent Vitals/Pain Pain Assessment Pain Assessment: No/denies pain    Home Living Family/patient expects to be discharged to:: Private residence Living Arrangements: Spouse/significant other Available Help at Discharge: Family;Available PRN/intermittently (daughter lives nearby) Type of Home: House Home Access: Stairs to enter Entrance Stairs-Rails: Doctor, general practice of Steps: 5 Alternate Level Stairs-Number of Steps: 16 steps; unsure on rails Home Layout: Two level Home Equipment: Agricultural consultant (2 wheels);Shower seat - built in;Hand held shower head Additional Comments: no bedroom on main level, but pt is willing to sleep on couch to stay on 1st level. Multiple entrances into the house with stairs, all stairs in house have at least 1 rail    Prior Function Prior Level of Function : Independent/Modified Independent;Needs assist;History of Falls (last six months)       Physical Assist : ADLs (physical);Mobility (physical) Mobility (physical): Gait;Stairs   Mobility Comments: at baseline, pt is IND however recently after finishing radiation she has had 2 falls and needed CGA/SBA for mobility and unable to get up/down the steps ADLs Comments: typically IND/mod I     Extremity/Trunk Assessment   Upper Extremity Assessment Upper Extremity Assessment: Generalized weakness;Overall WFL for tasks assessed    Lower Extremity Assessment Lower Extremity Assessment: Generalized weakness       Communication   Communication Communication: No apparent difficulties    Cognition Arousal: Alert Behavior During  Therapy: WFL for tasks assessed/performed   PT - Cognitive impairments: No apparent impairments                       PT - Cognition Comments: A&Ox4, pleasant and cooperative Following commands: Intact       Cueing Cueing Techniques: Verbal cues, Tactile cues, Visual cues     General Comments General comments (skin integrity, edema, etc.): fatigues easily    Exercises Other Exercises Other Exercises: Pt educated on strategies to prevent dizziness with position changes, emphasis on RW use at home to minimize fall risk Other Exercises: HR and SpO2 monitored during session, SpO2 >92% and HR in low to mid 90s   Assessment/Plan    PT Assessment Patient needs continued PT services  PT Problem List Decreased strength;Decreased activity tolerance;Decreased balance;Decreased mobility;Decreased knowledge of use of DME;Decreased safety awareness       PT Treatment Interventions DME instruction;Gait training;Stair training;Functional mobility training;Therapeutic activities;Therapeutic exercise;Balance training;Neuromuscular re-education;Patient/family education    PT Goals (Current goals can be found in the Care Plan section)  Acute Rehab PT Goals Patient Stated Goal: to get stronger PT Goal Formulation: With patient Time For Goal Achievement: 01/18/24 Potential to Achieve Goals: Good    Frequency Min 2X/week     Co-evaluation               AM-PAC PT 6 Clicks Mobility  Outcome Measure Help needed turning from your back to your side while  in a flat bed without using bedrails?: None Help needed moving from lying on your back to sitting on the side of a flat bed without using bedrails?: None Help needed moving to and from a bed to a chair (including a wheelchair)?: A Little Help needed standing up from a chair using your arms (e.g., wheelchair or bedside chair)?: A Little Help needed to walk in hospital room?: A Little Help needed climbing 3-5 steps with a railing?  : A Little 6 Click Score: 20    End of Session Equipment Utilized During Treatment: Gait belt Activity Tolerance: Patient tolerated treatment well Patient left: in bed;with call bell/phone within reach;with bed alarm set;with family/visitor present Nurse Communication: Mobility status PT Visit Diagnosis: Unsteadiness on feet (R26.81);Other abnormalities of gait and mobility (R26.89);Muscle weakness (generalized) (M62.81);History of falling (Z91.81)    Time: 8879-8855 PT Time Calculation (min) (ACUTE ONLY): 24 min   Charges:   PT Evaluation $PT Eval Moderate Complexity: 1 Mod PT Treatments $Gait Training: 8-22 mins PT General Charges $$ ACUTE PT VISIT: 1 Visit         Janell Axe, SPT

## 2024-01-10 ENCOUNTER — Inpatient Hospital Stay: Attending: Internal Medicine

## 2024-01-10 ENCOUNTER — Inpatient Hospital Stay: Admitting: Nurse Practitioner

## 2024-01-10 ENCOUNTER — Encounter: Payer: Self-pay | Admitting: Nurse Practitioner

## 2024-01-10 ENCOUNTER — Telehealth: Payer: Self-pay | Admitting: *Deleted

## 2024-01-10 VITALS — BP 126/62 | HR 78 | Temp 98.0°F | Resp 18 | Ht 62.0 in | Wt 123.3 lb

## 2024-01-10 DIAGNOSIS — H5702 Anisocoria: Secondary | ICD-10-CM | POA: Insufficient documentation

## 2024-01-10 DIAGNOSIS — N179 Acute kidney failure, unspecified: Secondary | ICD-10-CM | POA: Insufficient documentation

## 2024-01-10 DIAGNOSIS — I3139 Other pericardial effusion (noninflammatory): Secondary | ICD-10-CM | POA: Diagnosis not present

## 2024-01-10 DIAGNOSIS — M8580 Other specified disorders of bone density and structure, unspecified site: Secondary | ICD-10-CM | POA: Diagnosis not present

## 2024-01-10 DIAGNOSIS — C50312 Malignant neoplasm of lower-inner quadrant of left female breast: Secondary | ICD-10-CM | POA: Diagnosis present

## 2024-01-10 DIAGNOSIS — K5730 Diverticulosis of large intestine without perforation or abscess without bleeding: Secondary | ICD-10-CM | POA: Insufficient documentation

## 2024-01-10 DIAGNOSIS — N390 Urinary tract infection, site not specified: Secondary | ICD-10-CM | POA: Insufficient documentation

## 2024-01-10 DIAGNOSIS — R7989 Other specified abnormal findings of blood chemistry: Secondary | ICD-10-CM | POA: Insufficient documentation

## 2024-01-10 DIAGNOSIS — R0982 Postnasal drip: Secondary | ICD-10-CM | POA: Insufficient documentation

## 2024-01-10 DIAGNOSIS — R600 Localized edema: Secondary | ICD-10-CM | POA: Diagnosis not present

## 2024-01-10 DIAGNOSIS — E876 Hypokalemia: Secondary | ICD-10-CM | POA: Insufficient documentation

## 2024-01-10 DIAGNOSIS — I871 Compression of vein: Secondary | ICD-10-CM | POA: Diagnosis not present

## 2024-01-10 DIAGNOSIS — R296 Repeated falls: Secondary | ICD-10-CM | POA: Insufficient documentation

## 2024-01-10 DIAGNOSIS — Z90710 Acquired absence of both cervix and uterus: Secondary | ICD-10-CM | POA: Insufficient documentation

## 2024-01-10 DIAGNOSIS — Z923 Personal history of irradiation: Secondary | ICD-10-CM | POA: Insufficient documentation

## 2024-01-10 DIAGNOSIS — Z880 Allergy status to penicillin: Secondary | ICD-10-CM | POA: Insufficient documentation

## 2024-01-10 DIAGNOSIS — R5383 Other fatigue: Secondary | ICD-10-CM | POA: Diagnosis not present

## 2024-01-10 DIAGNOSIS — E860 Dehydration: Secondary | ICD-10-CM | POA: Insufficient documentation

## 2024-01-10 DIAGNOSIS — I2510 Atherosclerotic heart disease of native coronary artery without angina pectoris: Secondary | ICD-10-CM | POA: Insufficient documentation

## 2024-01-10 DIAGNOSIS — K208 Other esophagitis without bleeding: Secondary | ICD-10-CM | POA: Diagnosis not present

## 2024-01-10 DIAGNOSIS — K8020 Calculus of gallbladder without cholecystitis without obstruction: Secondary | ICD-10-CM | POA: Insufficient documentation

## 2024-01-10 DIAGNOSIS — L03114 Cellulitis of left upper limb: Secondary | ICD-10-CM | POA: Diagnosis not present

## 2024-01-10 DIAGNOSIS — Z7401 Bed confinement status: Secondary | ICD-10-CM | POA: Insufficient documentation

## 2024-01-10 DIAGNOSIS — Z79899 Other long term (current) drug therapy: Secondary | ICD-10-CM | POA: Insufficient documentation

## 2024-01-10 DIAGNOSIS — M4312 Spondylolisthesis, cervical region: Secondary | ICD-10-CM | POA: Insufficient documentation

## 2024-01-10 DIAGNOSIS — Z8616 Personal history of COVID-19: Secondary | ICD-10-CM | POA: Insufficient documentation

## 2024-01-10 DIAGNOSIS — R531 Weakness: Secondary | ICD-10-CM | POA: Insufficient documentation

## 2024-01-10 DIAGNOSIS — Z79811 Long term (current) use of aromatase inhibitors: Secondary | ICD-10-CM | POA: Insufficient documentation

## 2024-01-10 DIAGNOSIS — Z885 Allergy status to narcotic agent status: Secondary | ICD-10-CM | POA: Insufficient documentation

## 2024-01-10 DIAGNOSIS — I700 Atherosclerosis of aorta: Secondary | ICD-10-CM | POA: Insufficient documentation

## 2024-01-10 DIAGNOSIS — I119 Hypertensive heart disease without heart failure: Secondary | ICD-10-CM | POA: Insufficient documentation

## 2024-01-10 DIAGNOSIS — Z9012 Acquired absence of left breast and nipple: Secondary | ICD-10-CM | POA: Insufficient documentation

## 2024-01-10 DIAGNOSIS — Y842 Radiological procedure and radiotherapy as the cause of abnormal reaction of the patient, or of later complication, without mention of misadventure at the time of the procedure: Secondary | ICD-10-CM | POA: Insufficient documentation

## 2024-01-10 DIAGNOSIS — C50919 Malignant neoplasm of unspecified site of unspecified female breast: Secondary | ICD-10-CM

## 2024-01-10 DIAGNOSIS — J069 Acute upper respiratory infection, unspecified: Secondary | ICD-10-CM | POA: Diagnosis not present

## 2024-01-10 DIAGNOSIS — Z87891 Personal history of nicotine dependence: Secondary | ICD-10-CM | POA: Insufficient documentation

## 2024-01-10 DIAGNOSIS — Z1721 Progesterone receptor positive status: Secondary | ICD-10-CM | POA: Insufficient documentation

## 2024-01-10 DIAGNOSIS — J90 Pleural effusion, not elsewhere classified: Secondary | ICD-10-CM | POA: Diagnosis not present

## 2024-01-10 DIAGNOSIS — J432 Centrilobular emphysema: Secondary | ICD-10-CM | POA: Diagnosis not present

## 2024-01-10 DIAGNOSIS — Z1732 Human epidermal growth factor receptor 2 negative status: Secondary | ICD-10-CM | POA: Insufficient documentation

## 2024-01-10 DIAGNOSIS — Z170 Estrogen receptor positive status [ER+]: Secondary | ICD-10-CM | POA: Insufficient documentation

## 2024-01-10 LAB — MAGNESIUM: Magnesium: 2.5 mg/dL — ABNORMAL HIGH (ref 1.7–2.4)

## 2024-01-10 LAB — CBC WITH DIFFERENTIAL (CANCER CENTER ONLY)
Abs Immature Granulocytes: 0.01 K/uL (ref 0.00–0.07)
Basophils Absolute: 0 K/uL (ref 0.0–0.1)
Basophils Relative: 1 %
Eosinophils Absolute: 0.1 K/uL (ref 0.0–0.5)
Eosinophils Relative: 2 %
HCT: 35 % — ABNORMAL LOW (ref 36.0–46.0)
Hemoglobin: 11.6 g/dL — ABNORMAL LOW (ref 12.0–15.0)
Immature Granulocytes: 1 %
Lymphocytes Relative: 38 %
Lymphs Abs: 0.8 K/uL (ref 0.7–4.0)
MCH: 33.1 pg (ref 26.0–34.0)
MCHC: 33.1 g/dL (ref 30.0–36.0)
MCV: 100 fL (ref 80.0–100.0)
Monocytes Absolute: 0.2 K/uL (ref 0.1–1.0)
Monocytes Relative: 10 %
Neutro Abs: 1 K/uL — ABNORMAL LOW (ref 1.7–7.7)
Neutrophils Relative %: 48 %
Platelet Count: 144 K/uL — ABNORMAL LOW (ref 150–400)
RBC: 3.5 MIL/uL — ABNORMAL LOW (ref 3.87–5.11)
RDW: 19.3 % — ABNORMAL HIGH (ref 11.5–15.5)
WBC Count: 2.1 K/uL — ABNORMAL LOW (ref 4.0–10.5)
nRBC: 0 % (ref 0.0–0.2)

## 2024-01-10 NOTE — Progress Notes (Signed)
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 Smith]]  ER/PR- >90%; Her 2 Nue- NEG; Lumpec & SLNBx Oncotype-RS=19 No Chemo; s/p RT [Dr.Crystal] April 2016- Arimidex; Stopped Oct 2016; START OCT 2016- Femara ; Sep 19th stopped sec to [facility]; sep 19th 2019- START AROMASIN ; stop summer 2021.  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 PET scan- JULY 2025- Confluent abnormal soft tissue along the upper mediastinum. More towards the right side with involvement of the right lung hilum and separate tissue or nodes thoracic inlet on the right side adjacent to the right clavicle corresponding to the finding by prior CT scan. here are 4 hypermetabolic small bony lesions identified including thoracic spine at T1 and T2, the right acetabulum and left scapula worrisome for bony metastatic disease. A thoracic spine MRI may be of some benefit to further characterize the spine lesion. Two small nonspecific areas of asymmetric uptake along skeletal muscle of the left gluteus medius muscle and the left pectoralis muscle.   NGS-pending.   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   # BMD- Osteopenia [may 2016]; AUG 2018- T score= -1.1 [improved] ------------------------------------------------------------   DIAGNOSIS: BREAST CANCER    CURRENT/MOST RECENT THERAPY: Surveillance.    Carcinoma of lower-inner quadrant of left breast in female, estrogen receptor positive  (HCC)  04/12/2020 Cancer Staging   Staging form: Breast, AJCC 8th Edition - Clinical: Stage IB (cT2, cN0, cM0, G2, ER+, PR+, HER2-) - Signed by Brahmanday, Govinda R, MD on 04/12/2020     HISTORY OF PRESENTING ILLNESS: Patient ambulating-independently. Accompanied by husband.  Dana Dana [name] 78 y.o. female pleasant patient with history of recurrent breast cancer, stage IV, ER positive, HER2/neu negative, with SVC syndrome, currently receiving radiation and Ribociclib  plus letrozole .  She returns to clinic for hospital follow-up.  In the interim, she was seen in the emergency room for worsening weakness, shortness of breath, falls secondary to UTI.  She was found to have hypokalemia with potassium of 2.8, normal magnesium, BUN 39, creatinine 1.21, AST 209, ALT 72, alkaline phosphatase 55, T. bili 2.5.  UA not consistent with UTI.  Hypokalemia thought to be secondary to torsemide  as well.  AKI thought to be prerenal etiology secondary to dehydration with diarrhea and torsemide .  It resolved with IV fluids.  Dana Dana [name] has been held given her symptoms.  She was discharged to home on 01/04/2024.  She has edema of the left hand post hospitalization. She is getting stronger. Working with PT once a week. Has ongoing vision changes making reading difficult. Not worse. Denies fevers or chills. No more diarrhea. Shortness of breath has improved.    Review of Systems  Constitutional:  Positive for malaise/fatigue. Negative for chills, diaphoresis, fever and weight loss.  HENT:  Negative for nosebleeds and sore throat.   Eyes:  Negative for double vision.  Respiratory:  Negative for cough, hemoptysis, sputum production, shortness of breath and wheezing.   Cardiovascular:  Negative for chest pain, palpitations, orthopnea and leg swelling.  Gastrointestinal:  Negative for abdominal pain, blood in stool, constipation, diarrhea, heartburn, melena, nausea and vomiting.  Genitourinary:  Negative for dysuria,  frequency and urgency.  Musculoskeletal:  Negative for back pain, falls and joint pain.  Skin:  Negative for itching and rash.  Neurological:  Positive for weakness. Negative for dizziness, tingling, focal weakness and headaches.  Endo/Heme/Allergies:  Does not bruise/bleed easily.  Psychiatric/Behavioral:  Negative for depression. The patient is not nervous/anxious and does not have insomnia.    MEDICAL HISTORY:  Past Medical History:  Diagnosis Date   Breast cancer (HCC) 2015   left lumpectomy 04/05/2014 and reexcision 04/27/2014 - radiation treatment   History of COVID-19    Hypertension    Personal history of radiation therapy 2016   Left breast   SURGICAL HISTORY: Past Surgical History:  Procedure Laterality Date   ABDOMINAL HYSTERECTOMY     BREAST BIOPSY Left 03/15/2014   8:00 us  bx, SCLEROSING ADENOSIS AND COLUMNAR CELL CHANGE    BREAST BIOPSY Left 02/27/2014   invasive lobular carcinoma LIQ   BREAST LUMPECTOMY Left 04/03/2014   [facility], DC[facility], [facility], negative LN   BREAST LUMPECTOMY Left 04/27/2014   re-excision for clear margins   COMBINED HYSTEROSCOPY DIAGNOSTIC / D&C     PARTIAL MASTECTOMY WITH AXILLARY SENTINEL LYMPH NODE BIOPSY Left    SOCIAL HISTORY: Social History   Socioeconomic History   Marital status: Married    Spouse name: Not on file   Number of children: Not on file   Years of education: Not on file   Highest education level: Not on file  Occupational History   Not on file  Tobacco Use   Smoking status: Former    Current packs/day: 1.50    Types: Cigarettes   Smokeless tobacco: Never  Vaping Use   Vaping status: Never Used  Substance and Sexual Activity   Alcohol use: Yes    Alcohol/week: 2.0 standard drinks of alcohol    Types: 2 Glasses of wine per week   Drug use: Never   Sexual activity: Never  Other Topics Concern   Not on file  Social History Narrative   Not on file   Social Drivers of Health   Financial Resource Strain: Low Risk   (03/11/2023)   Received from [facility]   Overall Financial Resource Strain (CARDIA)    Difficulty of Paying Living Expenses: Not hard at all  Food Insecurity: No Food Insecurity (01/03/2024)   Hunger Vital Sign    Worried About Running Out of Food in the Last Year: Never true    Ran Out of Food in the Last Year: Never true  Transportation Needs: Patient Declined (01/03/2024)   PRAPARE - Administrator, Civil Service (Medical): Patient declined    Lack of Transportation (Non-Medical): Patient declined  Physical Activity: Not on file  Stress: Not on file  Social Connections: Patient Declined (01/03/2024)   Social Connection and Isolation Panel    Frequency of Communication with Friends and Family: Patient declined    Frequency of Social Gatherings with Friends and Family: Patient declined    Attends Religious Services: Patient declined    Database administrator or Organizations: Patient declined    Attends Banker Meetings: Patient declined    Marital Status: Patient declined  Intimate Partner Violence: Not At Risk (01/03/2024)   Humiliation, Afraid, Rape, and Kick questionnaire    Fear of Current or Ex-Partner: No    Emotionally Abused: No  Physically Abused: No    Sexually Abused: No   FAMILY HISTORY: Family History  Problem Relation Age of Onset   Breast cancer Neg Hx    ALLERGIES:  is allergic to percocet [oxycodone-acetaminophen ], codeine, codone [hydrocodone], oxycodone hcl, and penicillins.  MEDICATIONS:  Current Outpatient Medications  Medication Sig Dispense Refill   feeding supplement (ENSURE PLUS HIGH PROTEIN) LIQD Take 237 mLs by mouth 2 (two) times daily between meals. 20000 mL 1   letrozole  (FEMARA ) 2.5 MG tablet Take 1 tablet (2.5 mg total) by mouth daily. 90 tablet 1   magic mouthwash (nystatin, hydrocortisone, diphenhydrAMINE, lidocaine ) suspension 5 mLs 4 (four) times daily as needed for mouth pain.     metoprolol   tartrate (LOPRESSOR ) 25 MG tablet Take 25 mg by mouth 2 (two) times daily.     polyethylene glycol powder (MIRALAX ) 17 GM/SCOOP powder Take 17 g by mouth daily as needed for mild constipation or moderate constipation. Dissolve 1 capful (17g) in 4-8 ounces of liquid and take by mouth daily.     traMADol (ULTRAM) 50 MG tablet Take 1 tablet (50 mg total) by mouth every 8 (eight) hours as needed (mild pain). 30 tablet 0   Calcium-Vitamin D  600-200 MG-UNIT per tablet Take 1 tablet by mouth daily. (Patient not taking: Reported on 01/10/2024)     dexamethasone  (DECADRON ) 2 MG tablet Take 1 pill twice a day.  Take the second pill early in the evening.  Take with food. (Patient not taking: Reported on 01/10/2024) 14 tablet 0   ondansetron  (ZOFRAN ) 8 MG tablet One pill every 8 hours as needed for nausea/vomitting. (Patient not taking: Reported on 01/10/2024) 40 tablet 1   [Paused] ribociclib  succ (KISQALI  400MG  DAILY DOSE) 200 MG Therapy Pack Take 2 tablets (400 mg total) by mouth daily. Take for 21 days on, 7 days off, repeat every 28 days. (Patient not taking: Reported on 01/10/2024) 42 tablet 0   [Paused] torsemide  (DEMADEX ) 20 MG tablet Take 20 mg by mouth in the morning, at noon, in the evening, and at bedtime. (Patient not taking: Reported on 01/10/2024)     vitamin C (ASCORBIC ACID) 500 MG tablet Take 500 mg by mouth daily. (Patient not taking: Reported on 01/10/2024)     No current facility-administered medications for this visit.   PHYSICAL EXAMINATION: Vitals:   01/10/24 1440  BP: 126/62  Pulse: 78  Resp: 18  Temp: 98 F (36.7 C)  SpO2: 100%   Filed Weights   01/10/24 1440  Weight: 123 lb 4.8 oz (55.9 kg)   Physical Exam Vitals reviewed.  Constitutional:      Appearance: She is not ill-appearing (chronically ill appearing. In wheelchair.).     Comments: accompanied  HENT:     Head: Normocephalic and atraumatic.     Comments: Right pupil smaller than left. Left eye appears more  droopy     Mouth/Throat:     Pharynx: Oropharynx is clear.  Eyes:     General: No scleral icterus. Cardiovascular:     Rate and Rhythm: Normal rate and regular rhythm.  Pulmonary:     Comments: Decreased breath sounds bilaterally.  Chest:     Comments: Venous engorgement on the chest noted.  Abdominal:     General: There is no distension.     Palpations: Abdomen is soft.     Tenderness: There is no abdominal tenderness.  Musculoskeletal:        General: Normal range of motion.  Skin:    General:  Skin is warm.     Coloration: Skin is not pale.     Comments: Edema of left hand  Neurological:     Mental Status: She is alert and oriented to person, place, and time.  Psychiatric:        Mood and Affect: Mood normal.        Behavior: Behavior normal.    LABORATORY DATA:  I have reviewed the data as listed Lab Results  Component Value Date   WBC 2.1 (L) 01/10/2024   HGB 11.6 (L) 01/10/2024   HCT 35.0 (L) 01/10/2024   MCV 100.0 01/10/2024   PLT 144 (L) 01/10/2024   Recent Labs    12/13/23 1036 12/20/23 1305 01/03/24 1034 01/04/24 0319  NA 128* 130* 136 135  K 3.9 3.9 2.8* 3.4*  CL 96* 96* 87* 93*  CO2 22 25 28  32  GLUCOSE 103* 130* 131* 90  BUN 14 27* 39* 31*  CREATININE 0.81 0.79 1.21* 1.10*  CALCIUM 9.1 9.0 9.8 9.1  GFRNONAA >60 >60 46* 52*  PROT 6.7  --  7.1 6.2*  ALBUMIN 3.8  --  3.8 3.4*  AST 26  --  209* 154*  ALT 34  --  702* 665*  ALKPHOS 53  --  55 52  BILITOT 0.9  --  2.5* 2.2*    RADIOGRAPHIC STUDIES: I have personally reviewed the radiological images as listed and agreed with the findings in the report. MR ABDOMEN MRCP WO CONTRAST Result Date: 01/03/2024 CLINICAL DATA:  Elevated LFTs, no abdominal pain EXAM: MRI ABDOMEN WITHOUT CONTRAST  (INCLUDING MRCP) TECHNIQUE: Multiplanar multisequence MR imaging of the abdomen was performed. Heavily T2-weighted images of the biliary and pancreatic ducts were obtained, and three-dimensional MRCP images were  rendered by post processing. COMPARISON:  CT abdomen pelvis, 01/03/2024 FINDINGS: Lower chest: No acute abnormality.  Cardiomegaly. Hepatobiliary: No solid liver abnormality is seen. Gallbladder is packed with numerous tiny gallstones, not radiopaque on prior CT. No gallbladder wall thickening, or biliary dilatation. Pancreas: Unremarkable. No pancreatic ductal dilatation or surrounding inflammatory changes. Spleen: Normal in size without significant abnormality. Adrenals/Urinary Tract: Adrenal glands are unremarkable. Kidneys are normal, without renal calculi, solid lesion, or hydronephrosis. Bladder is unremarkable. Stomach/Bowel: Stomach is within normal limits. Appendix appears normal. No evidence of bowel wall thickening, distention, or inflammatory changes. Vascular/Lymphatic: No significant vascular findings are present. No enlarged abdominal or pelvic lymph nodes. Reproductive: No mass or other significant abnormality. Other: No abdominal wall hernia or abnormality. No ascites. Musculoskeletal: No acute or significant osseous findings. IMPRESSION: 1. Gallbladder is packed with numerous tiny gallstones, not radiopaque on prior CT. No gallbladder wall thickening, choledocholithiasis, or biliary dilatation. 2. No acute noncontrast MR findings in the abdomen. 3. Cardiomegaly. Electronically Signed   By: Marolyn JONETTA Jaksch M.D.   On: 01/03/2024 22:32   CT Head Wo Contrast Result Date: 01/03/2024 CLINICAL DATA:  Frequent falls, head and neck trauma, PE suspected, dizziness, hypotension, tachycardia, elevated LFTs, history of breast cancer * Tracking Code: BO * EXAM: CT HEAD WITHOUT CONTRAST CT CERVICAL SPINE WITHOUT CONTRAST CT CHEST ANGIOGRAM WITH CONTRAST CT ABDOMEN PELVIS WITH CONTRAST TECHNIQUE: Contiguous axial images were obtained from the base of the skull through the vertex without intravenous contrast. Multidetector CT imaging of the cervical spine was performed without intravenous contrast. Multiplanar CT  image reconstructions were also generated. Multidetector CT imaging of the chest was performed in the angiographic phase following bolus administration of contrast. Multi detector CT imaging of the abdomen  and pelvis was performed following bolus administration of intravenous contrast. RADIATION DOSE REDUCTION: This exam was performed according to the departmental dose-optimization program which includes automated exposure control, adjustment of the mA and/or kV according to patient size and/or use of iterative reconstruction technique. CONTRAST:  80mL OMNIPAQUE  IOHEXOL  350 MG/ML SOLN COMPARISON:  CT abdomen pelvis, 12/10/2023 CT venogram chest, 11/17/2023 FINDINGS: CT HEAD FINDINGS Brain: No evidence of acute infarction, hemorrhage, hydrocephalus, extra-axial collection or mass lesion/mass effect. Periventricular white matter hypodensity. Vascular: No hyperdense vessel or unexpected calcification. Skull: Normal. Negative for fracture or focal lesion. Sinuses/Orbits: No acute finding. Other: None. CT CERVICAL SPINE FINDINGS Alignment: Minimal degenerative anterolisthesis of C5 on C6. Skull base and vertebrae: No acute fracture. No primary bone lesion or focal pathologic process. Soft tissues and spinal canal: No prevertebral fluid or swelling. No visible canal hematoma. Disc levels:  Mild multilevel cervical disc degenerative disease. Other: None. CT CHEST ANGIOGRAM FINDINGS Cardiovascular: Satisfactory opacification of the pulmonary arteries to the segmental level. No evidence of pulmonary embolism. Normal heart size. Left and right coronary artery calcifications. Unchanged small pericardial effusion. Scattered aortic atherosclerosis Mediastinum/Nodes: Evaluation somewhat limited by dense streak artifact, however no obvious change in infiltrative soft tissue throughout the superior mediastinum encasing the aortic branch vessel origins (series 5, image 40) and extending inferiorly to the subcarinal station and right  hilum (series 5, image 61). No discretely enlarged mediastinal, hilar, or axillary lymph nodes. Thyroid  gland, trachea, and esophagus demonstrate no significant findings. Lungs/Pleura: Mild centrilobular emphysema. No pleural effusion or pneumothorax. Musculoskeletal: No chest wall abnormality. No acute osseous findings. 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 Review of the MIP images confirms the above findings. CT ABDOMEN PELVIS FINDINGS Hepatobiliary: No solid liver abnormality is seen. No gallstones, gallbladder wall thickening, or biliary dilatation. Pancreas: Unremarkable. No pancreatic ductal dilatation or surrounding inflammatory changes. Spleen: Normal in size without significant abnormality. Adrenals/Urinary Tract: Adrenal glands are unremarkable. Kidneys are normal, without renal calculi, solid lesion, or hydronephrosis. Bladder is unremarkable. Stomach/Bowel: Stomach is within normal limits. Appendix appears normal. No evidence of bowel wall thickening, distention, or inflammatory changes. Descending and sigmoid diverticulosis. Vascular/Lymphatic: Aortic atherosclerosis. No enlarged abdominal or pelvic lymph nodes. Reproductive: Hysterectomy. Other: No abdominal wall hernia or abnormality. No ascites. Musculoskeletal: No acute or significant osseous findings. IMPRESSION: 1. No acute intracranial pathology. 2. No fracture or static subluxation of the cervical spine. 3. No evidence of pulmonary embolism. 4. Evaluation of the chest is somewhat limited by dense streak artifact, however no obvious change in infiltrative soft tissue throughout the superior mediastinum encasing the aortic branch vessel origins and extending inferiorly to the subcarinal station and right hilum. 5. 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 6. No evidence of acute traumatic injury to the chest, abdomen, or pelvis. 7. Coronary artery  disease. Aortic Atherosclerosis (ICD10-I70.0) and Emphysema (ICD10-J43.9). Electronically Signed   By: Marolyn JONETTA Jaksch M.D.   On: 01/03/2024 14:31   CT Cervical Spine Wo Contrast Result Date: 01/03/2024 CLINICAL DATA:  Frequent falls, head and neck trauma, PE suspected, dizziness, hypotension, tachycardia, elevated LFTs, history of breast cancer * Tracking Code: BO * EXAM: CT HEAD WITHOUT CONTRAST CT CERVICAL SPINE WITHOUT CONTRAST CT CHEST ANGIOGRAM WITH CONTRAST CT ABDOMEN PELVIS WITH CONTRAST TECHNIQUE: Contiguous axial images were obtained from the base of the skull through the vertex without intravenous contrast. Multidetector CT imaging of the cervical spine was performed without  intravenous contrast. Multiplanar CT image reconstructions were also generated. Multidetector CT imaging of the chest was performed in the angiographic phase following bolus administration of contrast. Multi detector CT imaging of the abdomen and pelvis was performed following bolus administration of intravenous contrast. RADIATION DOSE REDUCTION: This exam was performed according to the departmental dose-optimization program which includes automated exposure control, adjustment of the mA and/or kV according to patient size and/or use of iterative reconstruction technique. CONTRAST:  80mL OMNIPAQUE  IOHEXOL  350 MG/ML SOLN COMPARISON:  CT abdomen pelvis, 12/10/2023 CT venogram chest, 11/17/2023 FINDINGS: CT HEAD FINDINGS Brain: No evidence of acute infarction, hemorrhage, hydrocephalus, extra-axial collection or mass lesion/mass effect. Periventricular white matter hypodensity. Vascular: No hyperdense vessel or unexpected calcification. Skull: Normal. Negative for fracture or focal lesion. Sinuses/Orbits: No acute finding. Other: None. CT CERVICAL SPINE FINDINGS Alignment: Minimal degenerative anterolisthesis of C5 on C6. Skull base and vertebrae: No acute fracture. No primary bone lesion or focal pathologic process. Soft tissues and  spinal canal: No prevertebral fluid or swelling. No visible canal hematoma. Disc levels:  Mild multilevel cervical disc degenerative disease. Other: None. CT CHEST ANGIOGRAM FINDINGS Cardiovascular: Satisfactory opacification of the pulmonary arteries to the segmental level. No evidence of pulmonary embolism. Normal heart size. Left and right coronary artery calcifications. Unchanged small pericardial effusion. Scattered aortic atherosclerosis Mediastinum/Nodes: Evaluation somewhat limited by dense streak artifact, however no obvious change in infiltrative soft tissue throughout the superior mediastinum encasing the aortic branch vessel origins (series 5, image 40) and extending inferiorly to the subcarinal station and right hilum (series 5, image 61). No discretely enlarged mediastinal, hilar, or axillary lymph nodes. Thyroid  gland, trachea, and esophagus demonstrate no significant findings. Lungs/Pleura: Mild centrilobular emphysema. No pleural effusion or pneumothorax. Musculoskeletal: No chest wall abnormality. No acute osseous findings. 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 Review of the MIP images confirms the above findings. CT ABDOMEN PELVIS FINDINGS Hepatobiliary: No solid liver abnormality is seen. No gallstones, gallbladder wall thickening, or biliary dilatation. Pancreas: Unremarkable. No pancreatic ductal dilatation or surrounding inflammatory changes. Spleen: Normal in size without significant abnormality. Adrenals/Urinary Tract: Adrenal glands are unremarkable. Kidneys are normal, without renal calculi, solid lesion, or hydronephrosis. Bladder is unremarkable. Stomach/Bowel: Stomach is within normal limits. Appendix appears normal. No evidence of bowel wall thickening, distention, or inflammatory changes. Descending and sigmoid diverticulosis. Vascular/Lymphatic: Aortic atherosclerosis. No enlarged abdominal or pelvic lymph nodes. Reproductive:  Hysterectomy. Other: No abdominal wall hernia or abnormality. No ascites. Musculoskeletal: No acute or significant osseous findings. IMPRESSION: 1. No acute intracranial pathology. 2. No fracture or static subluxation of the cervical spine. 3. No evidence of pulmonary embolism. 4. Evaluation of the chest is somewhat limited by dense streak artifact, however no obvious change in infiltrative soft tissue throughout the superior mediastinum encasing the aortic branch vessel origins and extending inferiorly to the subcarinal station and right hilum. 5. 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 6. No evidence of acute traumatic injury to the chest, abdomen, or pelvis. 7. Coronary artery disease. Aortic Atherosclerosis (ICD10-I70.0) and Emphysema (ICD10-J43.9). Electronically Signed   By: Marolyn JONETTA Jaksch M.D.   On: 01/03/2024 14:31   CT ABDOMEN PELVIS W CONTRAST Result Date: 01/03/2024 CLINICAL DATA:  Frequent falls, head and neck trauma, PE suspected, dizziness, hypotension, tachycardia, elevated LFTs, history of breast cancer * Tracking Code: BO * EXAM: CT HEAD WITHOUT CONTRAST CT CERVICAL SPINE WITHOUT CONTRAST CT CHEST ANGIOGRAM WITH CONTRAST  CT ABDOMEN PELVIS WITH CONTRAST TECHNIQUE: Contiguous axial images were obtained from the base of the skull through the vertex without intravenous contrast. Multidetector CT imaging of the cervical spine was performed without intravenous contrast. Multiplanar CT image reconstructions were also generated. Multidetector CT imaging of the chest was performed in the angiographic phase following bolus administration of contrast. Multi detector CT imaging of the abdomen and pelvis was performed following bolus administration of intravenous contrast. RADIATION DOSE REDUCTION: This exam was performed according to the departmental dose-optimization program which includes automated exposure control, adjustment of the mA and/or kV  according to patient size and/or use of iterative reconstruction technique. CONTRAST:  80mL OMNIPAQUE  IOHEXOL  350 MG/ML SOLN COMPARISON:  CT abdomen pelvis, 12/10/2023 CT venogram chest, 11/17/2023 FINDINGS: CT HEAD FINDINGS Brain: No evidence of acute infarction, hemorrhage, hydrocephalus, extra-axial collection or mass lesion/mass effect. Periventricular white matter hypodensity. Vascular: No hyperdense vessel or unexpected calcification. Skull: Normal. Negative for fracture or focal lesion. Sinuses/Orbits: No acute finding. Other: None. CT CERVICAL SPINE FINDINGS Alignment: Minimal degenerative anterolisthesis of C5 on C6. Skull base and vertebrae: No acute fracture. No primary bone lesion or focal pathologic process. Soft tissues and spinal canal: No prevertebral fluid or swelling. No visible canal hematoma. Disc levels:  Mild multilevel cervical disc degenerative disease. Other: None. CT CHEST ANGIOGRAM FINDINGS Cardiovascular: Satisfactory opacification of the pulmonary arteries to the segmental level. No evidence of pulmonary embolism. Normal heart size. Left and right coronary artery calcifications. Unchanged small pericardial effusion. Scattered aortic atherosclerosis Mediastinum/Nodes: Evaluation somewhat limited by dense streak artifact, however no obvious change in infiltrative soft tissue throughout the superior mediastinum encasing the aortic branch vessel origins (series 5, image 40) and extending inferiorly to the subcarinal station and right hilum (series 5, image 61). No discretely enlarged mediastinal, hilar, or axillary lymph nodes. Thyroid  gland, trachea, and esophagus demonstrate no significant findings. Lungs/Pleura: Mild centrilobular emphysema. No pleural effusion or pneumothorax. Musculoskeletal: No chest wall abnormality. No acute osseous findings. 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 Review of the MIP images confirms the above findings. CT  ABDOMEN PELVIS FINDINGS Hepatobiliary: No solid liver abnormality is seen. No gallstones, gallbladder wall thickening, or biliary dilatation. Pancreas: Unremarkable. No pancreatic ductal dilatation or surrounding inflammatory changes. Spleen: Normal in size without significant abnormality. Adrenals/Urinary Tract: Adrenal glands are unremarkable. Kidneys are normal, without renal calculi, solid lesion, or hydronephrosis. Bladder is unremarkable. Stomach/Bowel: Stomach is within normal limits. Appendix appears normal. No evidence of bowel wall thickening, distention, or inflammatory changes. Descending and sigmoid diverticulosis. Vascular/Lymphatic: Aortic atherosclerosis. No enlarged abdominal or pelvic lymph nodes. Reproductive: Hysterectomy. Other: No abdominal wall hernia or abnormality. No ascites. Musculoskeletal: No acute or significant osseous findings. IMPRESSION: 1. No acute intracranial pathology. 2. No fracture or static subluxation of the cervical spine. 3. No evidence of pulmonary embolism. 4. Evaluation of the chest is somewhat limited by dense streak artifact, however no obvious change in infiltrative soft tissue throughout the superior mediastinum encasing the aortic branch vessel origins and extending inferiorly to the subcarinal station and right hilum. 5. 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 6. No evidence of acute traumatic injury to the chest, abdomen, or pelvis. 7. Coronary artery disease. Aortic Atherosclerosis (ICD10-I70.0) and Emphysema (ICD10-J43.9). Electronically Signed   By: Marolyn JONETTA Jaksch M.D.   On: 01/03/2024 14:31   CT Angio Chest PE W/Cm &/Or Wo Cm Result Date: 01/03/2024 CLINICAL DATA:  Frequent  falls, head and neck trauma, PE suspected, dizziness, hypotension, tachycardia, elevated LFTs, history of breast cancer * Tracking Code: BO * EXAM: CT HEAD WITHOUT CONTRAST CT CERVICAL SPINE WITHOUT CONTRAST CT CHEST ANGIOGRAM WITH  CONTRAST CT ABDOMEN PELVIS WITH CONTRAST TECHNIQUE: Contiguous axial images were obtained from the base of the skull through the vertex without intravenous contrast. Multidetector CT imaging of the cervical spine was performed without intravenous contrast. Multiplanar CT image reconstructions were also generated. Multidetector CT imaging of the chest was performed in the angiographic phase following bolus administration of contrast. Multi detector CT imaging of the abdomen and pelvis was performed following bolus administration of intravenous contrast. RADIATION DOSE REDUCTION: This exam was performed according to the departmental dose-optimization program which includes automated exposure control, adjustment of the mA and/or kV according to patient size and/or use of iterative reconstruction technique. CONTRAST:  80mL OMNIPAQUE  IOHEXOL  350 MG/ML SOLN COMPARISON:  CT abdomen pelvis, 12/10/2023 CT venogram chest, 11/17/2023 FINDINGS: CT HEAD FINDINGS Brain: No evidence of acute infarction, hemorrhage, hydrocephalus, extra-axial collection or mass lesion/mass effect. Periventricular white matter hypodensity. Vascular: No hyperdense vessel or unexpected calcification. Skull: Normal. Negative for fracture or focal lesion. Sinuses/Orbits: No acute finding. Other: None. CT CERVICAL SPINE FINDINGS Alignment: Minimal degenerative anterolisthesis of C5 on C6. Skull base and vertebrae: No acute fracture. No primary bone lesion or focal pathologic process. Soft tissues and spinal canal: No prevertebral fluid or swelling. No visible canal hematoma. Disc levels:  Mild multilevel cervical disc degenerative disease. Other: None. CT CHEST ANGIOGRAM FINDINGS Cardiovascular: Satisfactory opacification of the pulmonary arteries to the segmental level. No evidence of pulmonary embolism. Normal heart size. Left and right coronary artery calcifications. Unchanged small pericardial effusion. Scattered aortic atherosclerosis  Mediastinum/Nodes: Evaluation somewhat limited by dense streak artifact, however no obvious change in infiltrative soft tissue throughout the superior mediastinum encasing the aortic branch vessel origins (series 5, image 40) and extending inferiorly to the subcarinal station and right hilum (series 5, image 61). No discretely enlarged mediastinal, hilar, or axillary lymph nodes. Thyroid  gland, trachea, and esophagus demonstrate no significant findings. Lungs/Pleura: Mild centrilobular emphysema. No pleural effusion or pneumothorax. Musculoskeletal: No chest wall abnormality. No acute osseous findings. 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 Review of the MIP images confirms the above findings. CT ABDOMEN PELVIS FINDINGS Hepatobiliary: No solid liver abnormality is seen. No gallstones, gallbladder wall thickening, or biliary dilatation. Pancreas: Unremarkable. No pancreatic ductal dilatation or surrounding inflammatory changes. Spleen: Normal in size without significant abnormality. Adrenals/Urinary Tract: Adrenal glands are unremarkable. Kidneys are normal, without renal calculi, solid lesion, or hydronephrosis. Bladder is unremarkable. Stomach/Bowel: Stomach is within normal limits. Appendix appears normal. No evidence of bowel wall thickening, distention, or inflammatory changes. Descending and sigmoid diverticulosis. Vascular/Lymphatic: Aortic atherosclerosis. No enlarged abdominal or pelvic lymph nodes. Reproductive: Hysterectomy. Other: No abdominal wall hernia or abnormality. No ascites. Musculoskeletal: No acute or significant osseous findings. IMPRESSION: 1. No acute intracranial pathology. 2. No fracture or static subluxation of the cervical spine. 3. No evidence of pulmonary embolism. 4. Evaluation of the chest is somewhat limited by dense streak artifact, however no obvious change in infiltrative soft tissue throughout the superior mediastinum encasing the  aortic branch vessel origins and extending inferiorly to the subcarinal station and right hilum. 5. 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 6. No evidence of acute traumatic injury to the chest, abdomen, or pelvis. 7. Coronary artery disease.  Aortic Atherosclerosis (ICD10-I70.0) and Emphysema (ICD10-J43.9). Electronically Signed   By: Marolyn JONETTA Jaksch M.D.   On: 01/03/2024 14:31   DG Chest 1 View Result Date: 01/03/2024 CLINICAL DATA:  Shortness of breath.  Frequent falls. EXAM: CHEST  1 VIEW COMPARISON:  Chest CT 11/17/2023 FINDINGS: Elevated right hemidiaphragm, right pleural effusion on CT not well demonstrated by radiograph. Normal heart size with stable mediastinal contours. Emphysema with bronchial thickening. No pneumothorax or evidence of acute airspace disease. On limited assessment, no acute osseous findings. IMPRESSION: 1. Elevated right hemidiaphragm, right pleural effusion on CT not well demonstrated by radiograph. 2. Emphysema with bronchial thickening. Electronically Signed   By: Andrea Gasman M.D.   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 PET scan- JULY 2025- Confluent abnormal soft tissue along the upper mediastinum. More towards the right side with involvement of the right lung hilum and separate tissue or nodes thoracic inlet on the right side adjacent to the right clavicle corresponding to the finding by prior CT scan. here are 4 hypermetabolic small bony lesions identified including thoracic spine at T1 and T2, the right acetabulum and left scapula worrisome for bony metastatic disease. A thoracic spine MRI may be of some benefit to further characterize the spine lesion. Two small  nonspecific areas of asymmetric uptake along skeletal muscle of the left gluteus medius muscle and the left pectoralis muscle.  September 2025-started letrozole  plus Ribociclib . Received radiation 11/26/23-12/27/23.  Plan to repeat imaging in November 2025 or after 3 months of treatment.  Would also consider sooner if symptoms worsening. Consider genetic and NGS testing.   # Currently on Ribociclib  at decreased dose of 400 mg, 3 weeks on, 1 week off.  With letrozole .  # Labs today reviewed.  ANC is 1.0. Plt 144, Hmg 11.6. CMP not performed. Recommend continuing to hold ribociclib . Will reach out to MD to discuss.   # Abnormal LFTs- AST 209, ALT 702 with normal alk phos, bilirubin 2.5. No obvious etiology on imaging. Suspect secondary to ribociclib . Continue to hold for now.   # AKI- thought to be secondary to diarrhea & torsemide . Received IV fluids in hospital. Torsemide  has been held.   # Diarrhea- now resolved  # Weakness - gradually improving. PT once a week at home. Using walker. No additional falls.   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 S/p radiation. s/p dexamethasone  2mg /day x 7 days. Blurry vision improved but not resolved. Proceed with evaluation with ophthalmology, Dr Dorathy.   # Bil UE edema- gradually improving. if symptomatic, getting worse consider re-imaging/Chest venogram-and then consideration of stent with Dr. Marea.  # Radiation esophagitis- s/p Magic mouthwash- improved- Completed RT. Now resolved.    # SEP 2025- [facility]- Enterococcus Faecalis. S/p amoxicillin . Improved.     # hyponatremia-multifactorial-sodium in the 130 recently- stable.   # Hypomagnesemia- Mag 2.5 today. Hold mag supplementation.    # OSTEOPENIA- [BMD= T score- 1.4]; continue ca+ vit D; exercise; monitor closely on therapy   # IV accesss: difficult with larger needles -patient reluctant with port.  However will discuss with Dr. Marea.   # DISPOSITION: # Follow up in 4 weeks as  scheduled with labs (cbc, cmp, ca27-29, mag) and see Dr Rennie- la  No  problem-specific Assessment & Plan notes found for this encounter.  Above plan of care was discussed with patient/family in detail.  My contact information was given to the patient/family.    Tinnie KANDICE Dawn, NP 01/10/2024

## 2024-01-10 NOTE — Progress Notes (Signed)
 01/04/24 [facility] for hypokalemia.  Since the start of radiation, her eye focus is off like when reading.  When should she start back on lasix and kisqali ?  She is having coughing spells off and on.

## 2024-01-10 NOTE — Telephone Encounter (Signed)
 Dr. Marikay she called today and she wanted to know what labs we had done today which was CBC with differential CA 27-29 and mag so that she would know what labs the patient might need.  When I got the message the cardio clinic was closed

## 2024-01-11 ENCOUNTER — Other Ambulatory Visit
Admission: RE | Admit: 2024-01-11 | Discharge: 2024-01-11 | Disposition: A | Discharge status: Home / Self Care | Source: Ambulatory Visit | Attending: Physician Assistant | Admitting: Physician Assistant

## 2024-01-11 DIAGNOSIS — N179 Acute kidney failure, unspecified: Secondary | ICD-10-CM | POA: Diagnosis present

## 2024-01-11 DIAGNOSIS — R7989 Other specified abnormal findings of blood chemistry: Secondary | ICD-10-CM | POA: Insufficient documentation

## 2024-01-11 LAB — COMPREHENSIVE METABOLIC PANEL WITH GFR
ALT: 255 U/L — ABNORMAL HIGH (ref 0–44)
AST: 77 U/L — ABNORMAL HIGH (ref 15–41)
Albumin: 3.7 g/dL (ref 3.5–5.0)
Alkaline Phosphatase: 58 U/L (ref 38–126)
Anion gap: 13 (ref 5–15)
BUN: 16 mg/dL (ref 8–23)
CO2: 24 mmol/L (ref 22–32)
Calcium: 9.1 mg/dL (ref 8.9–10.3)
Chloride: 102 mmol/L (ref 98–111)
Creatinine, Ser: 0.96 mg/dL (ref 0.44–1.00)
GFR, Estimated: >60 mL/min (ref >60)
Glucose, Bld: 112 mg/dL — ABNORMAL HIGH (ref 70–99)
Potassium: 4.5 mmol/L (ref 3.5–5.1)
Sodium: 139 mmol/L (ref 135–145)
Total Bilirubin: 0.9 mg/dL (ref 0.0–1.2)
Total Protein: 6.3 g/dL — ABNORMAL LOW (ref 6.5–8.1)

## 2024-01-11 LAB — CA 27.29 (SERIAL MONITOR): CA 27.29: 76.7 U/mL — ABNORMAL HIGH (ref 0.0–38.6)

## 2024-01-13 ENCOUNTER — Telehealth: Payer: Self-pay

## 2024-01-13 ENCOUNTER — Other Ambulatory Visit: Payer: Self-pay

## 2024-01-13 ENCOUNTER — Other Ambulatory Visit: Payer: Self-pay | Admitting: Nurse Practitioner

## 2024-01-13 ENCOUNTER — Telehealth: Payer: Self-pay | Admitting: *Deleted

## 2024-01-13 DIAGNOSIS — C50312 Malignant neoplasm of lower-inner quadrant of left female breast: Secondary | ICD-10-CM

## 2024-01-13 MED ORDER — RIBOCICLIB SUCC (200 MG DOSE) 200 MG PO TBPK
200.0000 mg | ORAL_TABLET | Freq: Every day | ORAL | 0 refills | Status: DC
  Filled 2024-01-13: qty 42, 42d supply, fill #0
  Filled 2024-01-14: qty 21, 28d supply, fill #0

## 2024-01-13 NOTE — Telephone Encounter (Signed)
 RN called pt to inform her of a new appointment. We are going to restart her Kisqali  at a lower dose. Sending in new prescription. Pt knows not to start taking it until she sees our pharmacist, Alyson next Thurs 10/23 at 11 am for labs and 11:30 am for visit with  Alyson

## 2024-01-13 NOTE — Telephone Encounter (Signed)
 error

## 2024-01-14 ENCOUNTER — Other Ambulatory Visit: Payer: Self-pay

## 2024-01-14 ENCOUNTER — Other Ambulatory Visit (HOSPITAL_COMMUNITY): Payer: Self-pay

## 2024-01-14 NOTE — Progress Notes (Signed)
 Specialty Pharmacy Refill Coordination Note  Dana Snow is a 78 y.o. female contacted today regarding refills of specialty medication(s) Ribociclib  Succinate (KISQALI  200mg  Daily Dose)   Patient requested Delivery   Delivery date: 01/18/24   Verified address: [address]   [place] [address]   Medication will be filled on 01/17/24.

## 2024-01-17 ENCOUNTER — Other Ambulatory Visit: Payer: Self-pay | Admitting: Emergency Medicine

## 2024-01-17 ENCOUNTER — Other Ambulatory Visit: Payer: Self-pay

## 2024-01-17 DIAGNOSIS — R7989 Other specified abnormal findings of blood chemistry: Secondary | ICD-10-CM

## 2024-01-17 NOTE — Progress Notes (Signed)
 Clinical Intervention Note  Clinical Intervention Notes: Patient's Kisqali  had been on hold due to recent hospitalization and restarted at a lower dose. Patient to start next cycle after her appointment with Alyson on 10/23   Clinical Intervention Outcomes: Prevention of an adverse drug event; Improved therapy adherence   [facility] Specialty Pharmacist

## 2024-01-19 ENCOUNTER — Other Ambulatory Visit: Payer: Self-pay | Admitting: *Deleted

## 2024-01-19 DIAGNOSIS — C50312 Malignant neoplasm of lower-inner quadrant of left female breast: Secondary | ICD-10-CM

## 2024-01-19 NOTE — Progress Notes (Signed)
 Clinical Pharmacist Practitioner Clinic [facility]  [phone] [phone]  Patient Care Team: Marikay Eva POUR, [name] as PCP - General (Physician Assistant) Rennie Cindy SAUNDERS, MD as Consulting Physician (Oncology)   Name of the patient: Dana Snow  [id number]  06/02/1945   Date of visit: 01/20/24  HPI: Patient is a 78 y.o. female with metastatic breast cancer. She started ribociclib  on 12/01/23. Ribociclib  was held on 01/03/24 during hospitalization when patient was noted to have elevated LFTs, patient continued to hold at discharge.   Reason for Consult: Oral chemotherapy follow-up for ribociclib  therapy.   PAST MEDICAL HISTORY: Past Medical History:  Diagnosis Date   Breast cancer (HCC) 2015   left lumpectomy 04/05/2014 and reexcision 04/27/2014 - radiation treatment   History of COVID-19    Hypertension    Personal history of radiation therapy 2016   Left breast    HEMATOLOGY/ONCOLOGY HISTORY:  Oncology History Overview Note  #DEC 2015- LEFT  BREAST CANCER STAGE IIA [Mixed Ductal +Lobular Ca; T=2 (3.8CM); psN=0; Dr. Smith]]  ER/PR- >90%; Her 2 Nue- NEG; Lumpec & SLNBx Oncotype-RS=19 No Chemo; s/p RT [Dr.Crystal] April 2016- Arimidex; Stopped Oct 2016; START OCT 2016- Femara ; Sep 19th stopped sec to [place]; sep 19th 2019- START AROMASIN ; stop summer 2021.  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 PET scan- JULY 2025- Confluent abnormal soft tissue along the upper mediastinum. More towards the right side with involvement of the right lung hilum and separate tissue or nodes thoracic inlet on the right side adjacent to the right clavicle corresponding to the finding by prior CT scan. here are 4 hypermetabolic small bony lesions identified including thoracic spine at T1 and T2, the right acetabulum and left scapula  worrisome for bony metastatic disease. A thoracic spine MRI may be of some benefit to further characterize the spine lesion. Two small nonspecific areas of asymmetric uptake along skeletal muscle of the left gluteus medius muscle and the left pectoralis muscle.   NGS-pending.   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   # BMD- Osteopenia [may 2016]; AUG 2018- T score= -1.1 [improved] ------------------------------------------------------------   DIAGNOSIS: BREAST CANCER    CURRENT/MOST RECENT THERAPY: Surveillance.    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  MEDICATIONS:  Current Outpatient Medications  Medication Sig Dispense Refill   Calcium-Vitamin D  600-200 MG-UNIT per tablet Take 1 tablet by mouth daily. (Patient not taking: Reported on 01/20/2024)     dexamethasone  (DECADRON ) 2 MG tablet Take 1 pill twice a day.  Take the second pill early in the evening.  Take with food. (Patient not taking: Reported on 01/20/2024) 14 tablet 0   feeding supplement (ENSURE PLUS HIGH PROTEIN) LIQD Take 237 mLs by mouth 2 (two) times daily between meals. 20000 mL 1   letrozole  (FEMARA ) 2.5 MG tablet Take 1 tablet (2.5 mg total) by mouth daily. 90 tablet 1   magic mouthwash (nystatin, hydrocortisone, diphenhydrAMINE, lidocaine ) suspension 5 mLs 4 (  four) times daily as needed for mouth pain.     metoprolol  tartrate (LOPRESSOR ) 25 MG tablet Take 25 mg by mouth 2 (two) times daily.     ondansetron  (ZOFRAN ) 8 MG tablet One pill every 8 hours as needed for  nausea/vomitting. (Patient not taking: Reported on 01/20/2024) 40 tablet 1   polyethylene glycol powder (MIRALAX ) 17 GM/SCOOP powder Take 17 g by mouth daily as needed for mild constipation or moderate constipation. Dissolve 1 capful (17g) in 4-8 ounces of liquid and take by mouth daily.     ribociclib  succ (KISQALI  200MG  DAILY DOSE) 200 MG Therapy Pack Take 1 tablet daily for 21 days on, 7 days off. Repeat every 28 days. (Patient not taking: Reported on 01/20/2024) 21 tablet 0   [Paused] torsemide  (DEMADEX ) 20 MG tablet Take 20 mg by mouth in the morning, at noon, in the evening, and at bedtime. (Patient not taking: Reported on 01/20/2024)     traMADol (ULTRAM) 50 MG tablet Take 1 tablet (50 mg total) by mouth every 8 (eight) hours as needed (mild pain). 30 tablet 0   vitamin C (ASCORBIC ACID) 500 MG tablet Take 500 mg by mouth daily. (Patient not taking: Reported on 01/20/2024)     No current facility-administered medications for this visit.    VITAL SIGNS: BP 130/72   Pulse 86   Temp 98.6 F (37 C)   Resp 17   Wt 55.8 kg (123 lb 1.6 oz)   SpO2 97%   BMI 22.52 kg/m  Filed Weights   01/20/24 1143  Weight: 55.8 kg (123 lb 1.6 oz)    Estimated body mass index is 22.52 kg/m as calculated from the following:   Height as of 01/10/24: 5' 2 (1.575 m).   Weight as of this encounter: 55.8 kg (123 lb 1.6 oz).  LABS: CBC:    Component Value Date/Time   WBC 8.1 01/20/2024 1047   WBC 2.9 (L) 01/04/2024 0319   HGB 11.5 (L) 01/20/2024 1047   HGB 14.8 07/17/2014 1005   HCT 35.8 (L) 01/20/2024 1047   HCT 44.1 07/17/2014 1005   PLT 273 01/20/2024 1047   PLT 227 07/17/2014 1005   MCV 103.2 (H) 01/20/2024 1047   MCV 92 07/17/2014 1005   NEUTROABS 5.0 01/20/2024 1047   NEUTROABS 2.8 07/17/2014 1005   LYMPHSABS 1.7 01/20/2024 1047   LYMPHSABS 1.3 07/17/2014 1005   MONOABS 1.3 (H) 01/20/2024 1047   MONOABS 0.5 07/17/2014 1005   EOSABS 0.0 01/20/2024 1047   EOSABS 0.3 07/17/2014 1005    BASOSABS 0.1 01/20/2024 1047   BASOSABS 0.1 07/17/2014 1005   Comprehensive Metabolic Panel:    Component Value Date/Time   NA 133 (L) 01/20/2024 1046   K 4.1 01/20/2024 1046   K 3.9 03/29/2014 1402   CL 100 01/20/2024 1046   CO2 25 01/20/2024 1046   BUN 17 01/20/2024 1046   CREATININE 0.84 01/20/2024 1046   CREATININE 0.74 07/17/2014 1005   GLUCOSE 82 01/20/2024 1046   CALCIUM 9.2 01/20/2024 1046   AST 56 (H) 01/20/2024 1046   ALT 115 (H) 01/20/2024 1046   ALT 16 07/17/2014 1005   ALKPHOS 63 01/20/2024 1046   ALKPHOS 70 07/17/2014 1005   BILITOT 1.1 01/20/2024 1046   PROT 6.5 01/20/2024 1046   PROT 7.4 07/17/2014 1005   ALBUMIN 3.5 01/20/2024 1046   ALBUMIN 4.4 07/17/2014 1005     Present during today's visit: patient and her husband   Assessment and Plan: Patient  presenting to clinic today with significant edema in her left hand and bilateral lower extremities, she also has a cough and reports the cold has been making its way through the family. Additionally she not that the has had to sleep sitting upright because she has trouble sleeping laying down Due to patient's acute issues, she was also seen by symptom management clinic NP Josh Borders CBC/CMP reviewed, AST/ALT and t.bili have improved enough to allow for patient to resume ribociclib  at reduced doe for 200mg  21on/7off Given the above current issues, she will hold for one more week and resume treatment next week at 200mg  for 21on/7off  Oral Chemotherapy Side Effect/Intolerance:  N/A ribociclib  was on hold   Oral Chemotherapy Adherence: N/a treatment was on hold No patient barriers to medication adherence identified.   New medications: None reported  Medication Access Issues: No issue, patient fills at [facility] (Specialty), she knows she can use her on hand 400mg  dose packs to take her 200mg  dose by just taking 1 tablet from the pack. She is storing her ribociclib  in the refrigerator.   Patient  expressed understanding and was in agreement with this plan. She also understands that She can call clinic at any time with any questions, concerns, or complaints.   Follow-up plan: RTC as scheduled  Thank you for allowing me to participate in the care of this very pleasant patient.   Time Total: 15 mins  Visit consisted of counseling and education on dealing with issues of symptom management in the setting of serious and potentially life-threatening illness.Greater than 50%  of this time was spent counseling and coordinating care related to the above assessment and plan.  Signed by: Lezlee Gills N. Kenshawn Maciolek, PharmD, NEILA, CPP Hematology/Oncology Clinical Pharmacist Practitioner [facility] [phone]  01/20/2024 3:00 PM

## 2024-01-20 ENCOUNTER — Encounter: Payer: Self-pay | Admitting: Hospice and Palliative Medicine

## 2024-01-20 ENCOUNTER — Inpatient Hospital Stay: Admitting: Hospice and Palliative Medicine

## 2024-01-20 ENCOUNTER — Inpatient Hospital Stay: Admitting: Pharmacist

## 2024-01-20 ENCOUNTER — Inpatient Hospital Stay

## 2024-01-20 ENCOUNTER — Other Ambulatory Visit: Payer: Self-pay

## 2024-01-20 VITALS — BP 130/72 | HR 86 | Temp 98.6°F | Resp 18

## 2024-01-20 VITALS — BP 130/72 | HR 86 | Temp 98.6°F | Resp 17 | Wt 123.1 lb

## 2024-01-20 DIAGNOSIS — R0981 Nasal congestion: Secondary | ICD-10-CM

## 2024-01-20 DIAGNOSIS — C50312 Malignant neoplasm of lower-inner quadrant of left female breast: Secondary | ICD-10-CM

## 2024-01-20 DIAGNOSIS — R051 Acute cough: Secondary | ICD-10-CM | POA: Diagnosis not present

## 2024-01-20 LAB — CBC WITH DIFFERENTIAL (CANCER CENTER ONLY)
Abs Immature Granulocytes: 0.07 K/uL (ref 0.00–0.07)
Basophils Absolute: 0.1 K/uL (ref 0.0–0.1)
Basophils Relative: 1 %
Eosinophils Absolute: 0 K/uL (ref 0.0–0.5)
Eosinophils Relative: 0 %
HCT: 35.8 % — ABNORMAL LOW (ref 36.0–46.0)
Hemoglobin: 11.5 g/dL — ABNORMAL LOW (ref 12.0–15.0)
Immature Granulocytes: 1 %
Lymphocytes Relative: 21 %
Lymphs Abs: 1.7 K/uL (ref 0.7–4.0)
MCH: 33.1 pg (ref 26.0–34.0)
MCHC: 32.1 g/dL (ref 30.0–36.0)
MCV: 103.2 fL — ABNORMAL HIGH (ref 80.0–100.0)
Monocytes Absolute: 1.3 K/uL — ABNORMAL HIGH (ref 0.1–1.0)
Monocytes Relative: 16 %
Neutro Abs: 5 K/uL (ref 1.7–7.7)
Neutrophils Relative %: 61 %
Platelet Count: 273 K/uL (ref 150–400)
RBC: 3.47 MIL/uL — ABNORMAL LOW (ref 3.87–5.11)
RDW: 20.1 % — ABNORMAL HIGH (ref 11.5–15.5)
WBC Count: 8.1 K/uL (ref 4.0–10.5)
nRBC: 0.4 % — ABNORMAL HIGH (ref 0.0–0.2)

## 2024-01-20 LAB — CMP (CANCER CENTER ONLY)
ALT: 115 U/L — ABNORMAL HIGH (ref 0–44)
AST: 56 U/L — ABNORMAL HIGH (ref 15–41)
Albumin: 3.5 g/dL (ref 3.5–5.0)
Alkaline Phosphatase: 63 U/L (ref 38–126)
Anion gap: 8 (ref 5–15)
BUN: 17 mg/dL (ref 8–23)
CO2: 25 mmol/L (ref 22–32)
Calcium: 9.2 mg/dL (ref 8.9–10.3)
Chloride: 100 mmol/L (ref 98–111)
Creatinine: 0.84 mg/dL (ref 0.44–1.00)
GFR, Estimated: >60 mL/min (ref >60)
Glucose, Bld: 82 mg/dL (ref 70–99)
Potassium: 4.1 mmol/L (ref 3.5–5.1)
Sodium: 133 mmol/L — ABNORMAL LOW (ref 135–145)
Total Bilirubin: 1.1 mg/dL (ref 0.0–1.2)
Total Protein: 6.5 g/dL (ref 6.5–8.1)

## 2024-01-20 NOTE — Progress Notes (Signed)
 Symptom Management Clinic [facility] at [facility] [phone] [phone]  Patient Care Team: Marikay Eva POUR, PA as PCP - General (Physician Assistant) Rennie Cindy SAUNDERS, MD as Consulting Physician (Oncology)   NAME OF PATIENT: Dana Snow  [id number]  Jan 06, 1946   DATE OF VISIT: 01/20/24  REASON FOR CONSULT: Dana Snow is a 78 y.o. 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  INTERVAL HISTORY: Patient recently hospitalized with hyperkalemia from diarrhea.  Also had generalized weakness.  Patient on treatment with dose reduced Ribociclib  and letrozole .  Patient was an add-on to [facility] with complaint of respiratory symptoms.  Patient reports several days of nasal congestion, postnasal drip, dry cough.  Denies fever or chills.  Says that she has been exposed to relatives who have had similar symptoms.  Patient also endorses worse upper and lower extremity edema since she was hospitalized and received IV fluids.  Asks about restarting her torsemide .  Denies any neurologic complaints. Denies recent fevers or chills. Denies any easy bleeding or bruising. Reports good appetite and denies weight loss. Denies chest pain. Denies any nausea, vomiting, constipation, or diarrhea. Denies urinary complaints. Patient offers no further specific complaints today.   PAST MEDICAL HISTORY: Past Medical History:  Diagnosis Date   Breast cancer (HCC) 2015   left lumpectomy 04/05/2014 and reexcision 04/27/2014 - radiation treatment   History of COVID-19    Hypertension    Personal history of radiation therapy 2016   Left breast    PAST SURGICAL HISTORY:  Past Surgical History:  Procedure Laterality Date   ABDOMINAL HYSTERECTOMY     BREAST BIOPSY Left 03/15/2014   8:00 us  bx, SCLEROSING ADENOSIS AND COLUMNAR CELL CHANGE    BREAST BIOPSY Left 02/27/2014    invasive lobular carcinoma LIQ   BREAST LUMPECTOMY Left 04/03/2014   [facility], DCIS, AL[facility], negative LN   BREAST LUMPECTOMY Left 04/27/2014   re-excision for clear margins   COMBINED HYSTEROSCOPY DIAGNOSTIC / D&C     PARTIAL MASTECTOMY WITH AXILLARY SENTINEL LYMPH NODE BIOPSY Left     HEMATOLOGY/ONCOLOGY HISTORY:  Oncology History Overview Note  #DEC 2015- LEFT  BREAST CANCER STAGE IIA [Mixed Ductal +Lobular Ca; T=2 (3.8CM); psN=0; Dr. Smith]]  ER/PR- >90%; Her 2 Nue- NEG; Lumpec & SLNBx Oncotype-RS=19 No Chemo; s/p RT [Dr.Crystal] April 2016- Arimidex; Stopped Oct 2016; START OCT 2016- Femara ; Sep 19th stopped sec to [facility]; sep 19th 2019- START AROMASIN ; stop summer 2021.  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 PET scan- JULY 2025- Confluent abnormal soft tissue along the upper mediastinum. More towards the right side with involvement of the right lung hilum and separate tissue or nodes thoracic inlet on the right side adjacent to the right clavicle corresponding to the finding by prior CT scan. here are 4 hypermetabolic small bony lesions identified including thoracic spine at T1 and T2, the right acetabulum and left scapula worrisome for bony metastatic disease. A thoracic spine MRI may be of some benefit to further characterize the spine lesion. Two small nonspecific areas of asymmetric uptake along skeletal muscle of the left gluteus medius muscle and the left pectoralis muscle.   NGS-pending.   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   #  BMD- Osteopenia [may 2016]; AUG 2018- T score= -1.1 [improved] ------------------------------------------------------------   DIAGNOSIS: BREAST CANCER    CURRENT/MOST RECENT THERAPY: Surveillance.    Carcinoma of lower-inner quadrant of left  breast in female, estrogen receptor positive (HCC)  04/12/2020 Cancer Staging   Staging form: Breast, AJCC 8th Edition - Clinical: Stage IB (cT2, cN0, cM0, G2, ER+, PR+, HER2-) - Signed by Rennie Cindy SAUNDERS, MD on 04/12/2020     ALLERGIES:  is allergic to percocet [oxycodone-acetaminophen ], codeine, codone [hydrocodone], oxycodone hcl, and penicillins.  MEDICATIONS:  Current Outpatient Medications  Medication Sig Dispense Refill   Calcium-Vitamin D  600-200 MG-UNIT per tablet Take 1 tablet by mouth daily. (Patient not taking: Reported on 01/10/2024)     dexamethasone  (DECADRON ) 2 MG tablet Take 1 pill twice a day.  Take the second pill early in the evening.  Take with food. (Patient not taking: Reported on 01/10/2024) 14 tablet 0   feeding supplement (ENSURE PLUS HIGH PROTEIN) LIQD Take 237 mLs by mouth 2 (two) times daily between meals. 20000 mL 1   letrozole  (FEMARA ) 2.5 MG tablet Take 1 tablet (2.5 mg total) by mouth daily. 90 tablet 1   magic mouthwash (nystatin, hydrocortisone, diphenhydrAMINE, lidocaine ) suspension 5 mLs 4 (four) times daily as needed for mouth pain.     metoprolol  tartrate (LOPRESSOR ) 25 MG tablet Take 25 mg by mouth 2 (two) times daily.     ondansetron  (ZOFRAN ) 8 MG tablet One pill every 8 hours as needed for nausea/vomitting. (Patient not taking: Reported on 01/10/2024) 40 tablet 1   polyethylene glycol powder (MIRALAX ) 17 GM/SCOOP powder Take 17 g by mouth daily as needed for mild constipation or moderate constipation. Dissolve 1 capful (17g) in 4-8 ounces of liquid and take by mouth daily.     ribociclib  succ (KISQALI  200MG  DAILY DOSE) 200 MG Therapy Pack Take 1 tablet daily for 21 days on, 7 days off. Repeat every 28 days. 21 tablet 0   [Paused] torsemide  (DEMADEX ) 20 MG tablet Take 20 mg by mouth in the morning, at noon, in the evening, and at bedtime. (Patient not taking: Reported on 01/10/2024)     traMADol (ULTRAM) 50 MG tablet Take 1 tablet (50 mg total) by  mouth every 8 (eight) hours as needed (mild pain). 30 tablet 0   vitamin C (ASCORBIC ACID) 500 MG tablet Take 500 mg by mouth daily. (Patient not taking: Reported on 01/10/2024)     No current facility-administered medications for this visit.    VITAL SIGNS: There were no vitals taken for this visit. There were no vitals filed for this visit.   Estimated body mass index is 22.52 kg/m as calculated from the following:   Height as of 01/10/24: 5' 2 (1.575 m).   Weight as of an earlier encounter on 01/20/24: 123 lb 1.6 oz (55.8 kg).  LABS: CBC:    Component Value Date/Time   WBC 8.1 01/20/2024 1047   WBC 2.9 (L) 01/04/2024 0319   HGB 11.5 (L) 01/20/2024 1047   HGB 14.8 07/17/2014 1005   HCT 35.8 (L) 01/20/2024 1047   HCT 44.1 07/17/2014 1005   PLT 273 01/20/2024 1047   PLT 227 07/17/2014 1005   MCV 103.2 (H) 01/20/2024 1047   MCV 92 07/17/2014 1005   NEUTROABS 5.0 01/20/2024 1047   NEUTROABS 2.8 07/17/2014 1005   LYMPHSABS 1.7 01/20/2024 1047   LYMPHSABS 1.3 07/17/2014 1005   MONOABS 1.3 (H) 01/20/2024 1047   MONOABS 0.5 07/17/2014 1005   EOSABS  0.0 01/20/2024 1047   EOSABS 0.3 07/17/2014 1005   BASOSABS 0.1 01/20/2024 1047   BASOSABS 0.1 07/17/2014 1005   Comprehensive Metabolic Panel:    Component Value Date/Time   NA 133 (L) 01/20/2024 1046   K 4.1 01/20/2024 1046   K 3.9 03/29/2014 1402   CL 100 01/20/2024 1046   CO2 25 01/20/2024 1046   BUN 17 01/20/2024 1046   CREATININE 0.84 01/20/2024 1046   CREATININE 0.74 07/17/2014 1005   GLUCOSE 82 01/20/2024 1046   CALCIUM 9.2 01/20/2024 1046   AST 56 (H) 01/20/2024 1046   ALT 115 (H) 01/20/2024 1046   ALT 16 07/17/2014 1005   ALKPHOS 63 01/20/2024 1046   ALKPHOS 70 07/17/2014 1005   BILITOT 1.1 01/20/2024 1046   PROT 6.5 01/20/2024 1046   PROT 7.4 07/17/2014 1005   ALBUMIN 3.5 01/20/2024 1046   ALBUMIN 4.4 07/17/2014 1005    RADIOGRAPHIC STUDIES: MR 3D Recon At Scanner Result Date: 01/17/2024 CLINICAL  DATA:  Elevated LFTs, no abdominal pain EXAM: MRI ABDOMEN WITHOUT CONTRAST  (INCLUDING MRCP) TECHNIQUE: Multiplanar multisequence MR imaging of the abdomen was performed. Heavily T2-weighted images of the biliary and pancreatic ducts were obtained, and three-dimensional MRCP images were rendered by post processing. COMPARISON:  CT abdomen pelvis, 01/03/2024 FINDINGS: Lower chest: No acute abnormality.  Cardiomegaly. Hepatobiliary: No solid liver abnormality is seen. Gallbladder is packed with numerous tiny gallstones, not radiopaque on prior CT. No gallbladder wall thickening, or biliary dilatation. Pancreas: Unremarkable. No pancreatic ductal dilatation or surrounding inflammatory changes. Spleen: Normal in size without significant abnormality. Adrenals/Urinary Tract: Adrenal glands are unremarkable. Kidneys are normal, without renal calculi, solid lesion, or hydronephrosis. Bladder is unremarkable. Stomach/Bowel: Stomach is within normal limits. Appendix appears normal. No evidence of bowel wall thickening, distention, or inflammatory changes. Vascular/Lymphatic: No significant vascular findings are present. No enlarged abdominal or pelvic lymph nodes. Reproductive: No mass or other significant abnormality. Other: No abdominal wall hernia or abnormality. No ascites. Musculoskeletal: No acute or significant osseous findings. IMPRESSION: 1. Gallbladder is packed with numerous tiny gallstones, not radiopaque on prior CT. No gallbladder wall thickening, choledocholithiasis, or biliary dilatation. 2. No acute noncontrast MR findings in the abdomen. 3. Cardiomegaly. Electronically Signed   By: Marolyn JONETTA Jaksch M.D.   On: 01/17/2024 08:30   MR ABDOMEN MRCP WO CONTRAST Result Date: 01/03/2024 CLINICAL DATA:  Elevated LFTs, no abdominal pain EXAM: MRI ABDOMEN WITHOUT CONTRAST  (INCLUDING MRCP) TECHNIQUE: Multiplanar multisequence MR imaging of the abdomen was performed. Heavily T2-weighted images of the biliary and  pancreatic ducts were obtained, and three-dimensional MRCP images were rendered by post processing. COMPARISON:  CT abdomen pelvis, 01/03/2024 FINDINGS: Lower chest: No acute abnormality.  Cardiomegaly. Hepatobiliary: No solid liver abnormality is seen. Gallbladder is packed with numerous tiny gallstones, not radiopaque on prior CT. No gallbladder wall thickening, or biliary dilatation. Pancreas: Unremarkable. No pancreatic ductal dilatation or surrounding inflammatory changes. Spleen: Normal in size without significant abnormality. Adrenals/Urinary Tract: Adrenal glands are unremarkable. Kidneys are normal, without renal calculi, solid lesion, or hydronephrosis. Bladder is unremarkable. Stomach/Bowel: Stomach is within normal limits. Appendix appears normal. No evidence of bowel wall thickening, distention, or inflammatory changes. Vascular/Lymphatic: No significant vascular findings are present. No enlarged abdominal or pelvic lymph nodes. Reproductive: No mass or other significant abnormality. Other: No abdominal wall hernia or abnormality. No ascites. Musculoskeletal: No acute or significant osseous findings. IMPRESSION: 1. Gallbladder is packed with numerous tiny gallstones, not radiopaque on prior CT. No gallbladder wall  thickening, choledocholithiasis, or biliary dilatation. 2. No acute noncontrast MR findings in the abdomen. 3. Cardiomegaly. Electronically Signed   By: Marolyn JONETTA Jaksch M.D.   On: 01/03/2024 22:32   CT Head Wo Contrast Result Date: 01/03/2024 CLINICAL DATA:  Frequent falls, head and neck trauma, PE suspected, dizziness, hypotension, tachycardia, elevated LFTs, history of breast cancer * Tracking Code: BO * EXAM: CT HEAD WITHOUT CONTRAST CT CERVICAL SPINE WITHOUT CONTRAST CT CHEST ANGIOGRAM WITH CONTRAST CT ABDOMEN PELVIS WITH CONTRAST TECHNIQUE: Contiguous axial images were obtained from the base of the skull through the vertex without intravenous contrast. Multidetector CT imaging of the  cervical spine was performed without intravenous contrast. Multiplanar CT image reconstructions were also generated. Multidetector CT imaging of the chest was performed in the angiographic phase following bolus administration of contrast. Multi detector CT imaging of the abdomen and pelvis was performed following bolus administration of intravenous contrast. RADIATION DOSE REDUCTION: This exam was performed according to the departmental dose-optimization program which includes automated exposure control, adjustment of the mA and/or kV according to patient size and/or use of iterative reconstruction technique. CONTRAST:  80mL OMNIPAQUE  IOHEXOL  350 MG/ML SOLN COMPARISON:  CT abdomen pelvis, 12/10/2023 CT venogram chest, 11/17/2023 FINDINGS: CT HEAD FINDINGS Brain: No evidence of acute infarction, hemorrhage, hydrocephalus, extra-axial collection or mass lesion/mass effect. Periventricular white matter hypodensity. Vascular: No hyperdense vessel or unexpected calcification. Skull: Normal. Negative for fracture or focal lesion. Sinuses/Orbits: No acute finding. Other: None. CT CERVICAL SPINE FINDINGS Alignment: Minimal degenerative anterolisthesis of C5 on C6. Skull base and vertebrae: No acute fracture. No primary bone lesion or focal pathologic process. Soft tissues and spinal canal: No prevertebral fluid or swelling. No visible canal hematoma. Disc levels:  Mild multilevel cervical disc degenerative disease. Other: None. CT CHEST ANGIOGRAM FINDINGS Cardiovascular: Satisfactory opacification of the pulmonary arteries to the segmental level. No evidence of pulmonary embolism. Normal heart size. Left and right coronary artery calcifications. Unchanged small pericardial effusion. Scattered aortic atherosclerosis Mediastinum/Nodes: Evaluation somewhat limited by dense streak artifact, however no obvious change in infiltrative soft tissue throughout the superior mediastinum encasing the aortic branch vessel origins (series  5, image 40) and extending inferiorly to the subcarinal station and right hilum (series 5, image 61). No discretely enlarged mediastinal, hilar, or axillary lymph nodes. Thyroid  gland, trachea, and esophagus demonstrate no significant findings. Lungs/Pleura: Mild centrilobular emphysema. No pleural effusion or pneumothorax. Musculoskeletal: No chest wall abnormality. No acute osseous findings. 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 Review of the MIP images confirms the above findings. CT ABDOMEN PELVIS FINDINGS Hepatobiliary: No solid liver abnormality is seen. No gallstones, gallbladder wall thickening, or biliary dilatation. Pancreas: Unremarkable. No pancreatic ductal dilatation or surrounding inflammatory changes. Spleen: Normal in size without significant abnormality. Adrenals/Urinary Tract: Adrenal glands are unremarkable. Kidneys are normal, without renal calculi, solid lesion, or hydronephrosis. Bladder is unremarkable. Stomach/Bowel: Stomach is within normal limits. Appendix appears normal. No evidence of bowel wall thickening, distention, or inflammatory changes. Descending and sigmoid diverticulosis. Vascular/Lymphatic: Aortic atherosclerosis. No enlarged abdominal or pelvic lymph nodes. Reproductive: Hysterectomy. Other: No abdominal wall hernia or abnormality. No ascites. Musculoskeletal: No acute or significant osseous findings. IMPRESSION: 1. No acute intracranial pathology. 2. No fracture or static subluxation of the cervical spine. 3. No evidence of pulmonary embolism. 4. Evaluation of the chest is somewhat limited by dense streak artifact, however no obvious change in infiltrative soft tissue throughout the superior mediastinum encasing the aortic branch vessel origins  and extending inferiorly to the subcarinal station and right hilum. 5. 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 6. No evidence of  acute traumatic injury to the chest, abdomen, or pelvis. 7. Coronary artery disease. Aortic Atherosclerosis (ICD10-I70.0) and Emphysema (ICD10-J43.9). Electronically Signed   By: Marolyn JONETTA Jaksch M.D.   On: 01/03/2024 14:31   CT Cervical Spine Wo Contrast Result Date: 01/03/2024 CLINICAL DATA:  Frequent falls, head and neck trauma, PE suspected, dizziness, hypotension, tachycardia, elevated LFTs, history of breast cancer * Tracking Code: BO * EXAM: CT HEAD WITHOUT CONTRAST CT CERVICAL SPINE WITHOUT CONTRAST CT CHEST ANGIOGRAM WITH CONTRAST CT ABDOMEN PELVIS WITH CONTRAST TECHNIQUE: Contiguous axial images were obtained from the base of the skull through the vertex without intravenous contrast. Multidetector CT imaging of the cervical spine was performed without intravenous contrast. Multiplanar CT image reconstructions were also generated. Multidetector CT imaging of the chest was performed in the angiographic phase following bolus administration of contrast. Multi detector CT imaging of the abdomen and pelvis was performed following bolus administration of intravenous contrast. RADIATION DOSE REDUCTION: This exam was performed according to the departmental dose-optimization program which includes automated exposure control, adjustment of the mA and/or kV according to patient size and/or use of iterative reconstruction technique. CONTRAST:  80mL OMNIPAQUE  IOHEXOL  350 MG/ML SOLN COMPARISON:  CT abdomen pelvis, 12/10/2023 CT venogram chest, 11/17/2023 FINDINGS: CT HEAD FINDINGS Brain: No evidence of acute infarction, hemorrhage, hydrocephalus, extra-axial collection or mass lesion/mass effect. Periventricular white matter hypodensity. Vascular: No hyperdense vessel or unexpected calcification. Skull: Normal. Negative for fracture or focal lesion. Sinuses/Orbits: No acute finding. Other: None. CT CERVICAL SPINE FINDINGS Alignment: Minimal degenerative anterolisthesis of C5 on C6. Skull base and vertebrae: No acute  fracture. No primary bone lesion or focal pathologic process. Soft tissues and spinal canal: No prevertebral fluid or swelling. No visible canal hematoma. Disc levels:  Mild multilevel cervical disc degenerative disease. Other: None. CT CHEST ANGIOGRAM FINDINGS Cardiovascular: Satisfactory opacification of the pulmonary arteries to the segmental level. No evidence of pulmonary embolism. Normal heart size. Left and right coronary artery calcifications. Unchanged small pericardial effusion. Scattered aortic atherosclerosis Mediastinum/Nodes: Evaluation somewhat limited by dense streak artifact, however no obvious change in infiltrative soft tissue throughout the superior mediastinum encasing the aortic branch vessel origins (series 5, image 40) and extending inferiorly to the subcarinal station and right hilum (series 5, image 61). No discretely enlarged mediastinal, hilar, or axillary lymph nodes. Thyroid  gland, trachea, and esophagus demonstrate no significant findings. Lungs/Pleura: Mild centrilobular emphysema. No pleural effusion or pneumothorax. Musculoskeletal: No chest wall abnormality. No acute osseous findings. 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 Review of the MIP images confirms the above findings. CT ABDOMEN PELVIS FINDINGS Hepatobiliary: No solid liver abnormality is seen. No gallstones, gallbladder wall thickening, or biliary dilatation. Pancreas: Unremarkable. No pancreatic ductal dilatation or surrounding inflammatory changes. Spleen: Normal in size without significant abnormality. Adrenals/Urinary Tract: Adrenal glands are unremarkable. Kidneys are normal, without renal calculi, solid lesion, or hydronephrosis. Bladder is unremarkable. Stomach/Bowel: Stomach is within normal limits. Appendix appears normal. No evidence of bowel wall thickening, distention, or inflammatory changes. Descending and sigmoid diverticulosis. Vascular/Lymphatic: Aortic  atherosclerosis. No enlarged abdominal or pelvic lymph nodes. Reproductive: Hysterectomy. Other: No abdominal wall hernia or abnormality. No ascites. Musculoskeletal: No acute or significant osseous findings. IMPRESSION: 1. No acute intracranial pathology. 2. No fracture or static subluxation of the cervical spine. 3. No  evidence of pulmonary embolism. 4. Evaluation of the chest is somewhat limited by dense streak artifact, however no obvious change in infiltrative soft tissue throughout the superior mediastinum encasing the aortic branch vessel origins and extending inferiorly to the subcarinal station and right hilum. 5. 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 6. No evidence of acute traumatic injury to the chest, abdomen, or pelvis. 7. Coronary artery disease. Aortic Atherosclerosis (ICD10-I70.0) and Emphysema (ICD10-J43.9). Electronically Signed   By: Marolyn JONETTA Jaksch M.D.   On: 01/03/2024 14:31   CT ABDOMEN PELVIS W CONTRAST Result Date: 01/03/2024 CLINICAL DATA:  Frequent falls, head and neck trauma, PE suspected, dizziness, hypotension, tachycardia, elevated LFTs, history of breast cancer * Tracking Code: BO * EXAM: CT HEAD WITHOUT CONTRAST CT CERVICAL SPINE WITHOUT CONTRAST CT CHEST ANGIOGRAM WITH CONTRAST CT ABDOMEN PELVIS WITH CONTRAST TECHNIQUE: Contiguous axial images were obtained from the base of the skull through the vertex without intravenous contrast. Multidetector CT imaging of the cervical spine was performed without intravenous contrast. Multiplanar CT image reconstructions were also generated. Multidetector CT imaging of the chest was performed in the angiographic phase following bolus administration of contrast. Multi detector CT imaging of the abdomen and pelvis was performed following bolus administration of intravenous contrast. RADIATION DOSE REDUCTION: This exam was performed according to the departmental dose-optimization program which  includes automated exposure control, adjustment of the mA and/or kV according to patient size and/or use of iterative reconstruction technique. CONTRAST:  80mL OMNIPAQUE  IOHEXOL  350 MG/ML SOLN COMPARISON:  CT abdomen pelvis, 12/10/2023 CT venogram chest, 11/17/2023 FINDINGS: CT HEAD FINDINGS Brain: No evidence of acute infarction, hemorrhage, hydrocephalus, extra-axial collection or mass lesion/mass effect. Periventricular white matter hypodensity. Vascular: No hyperdense vessel or unexpected calcification. Skull: Normal. Negative for fracture or focal lesion. Sinuses/Orbits: No acute finding. Other: None. CT CERVICAL SPINE FINDINGS Alignment: Minimal degenerative anterolisthesis of C5 on C6. Skull base and vertebrae: No acute fracture. No primary bone lesion or focal pathologic process. Soft tissues and spinal canal: No prevertebral fluid or swelling. No visible canal hematoma. Disc levels:  Mild multilevel cervical disc degenerative disease. Other: None. CT CHEST ANGIOGRAM FINDINGS Cardiovascular: Satisfactory opacification of the pulmonary arteries to the segmental level. No evidence of pulmonary embolism. Normal heart size. Left and right coronary artery calcifications. Unchanged small pericardial effusion. Scattered aortic atherosclerosis Mediastinum/Nodes: Evaluation somewhat limited by dense streak artifact, however no obvious change in infiltrative soft tissue throughout the superior mediastinum encasing the aortic branch vessel origins (series 5, image 40) and extending inferiorly to the subcarinal station and right hilum (series 5, image 61). No discretely enlarged mediastinal, hilar, or axillary lymph nodes. Thyroid  gland, trachea, and esophagus demonstrate no significant findings. Lungs/Pleura: Mild centrilobular emphysema. No pleural effusion or pneumothorax. Musculoskeletal: No chest wall abnormality. No acute osseous findings. 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 Review of the MIP images confirms the above findings. CT ABDOMEN PELVIS FINDINGS Hepatobiliary: No solid liver abnormality is seen. No gallstones, gallbladder wall thickening, or biliary dilatation. Pancreas: Unremarkable. No pancreatic ductal dilatation or surrounding inflammatory changes. Spleen: Normal in size without significant abnormality. Adrenals/Urinary Tract: Adrenal glands are unremarkable. Kidneys are normal, without renal calculi, solid lesion, or hydronephrosis. Bladder is unremarkable. Stomach/Bowel: Stomach is within normal limits. Appendix appears normal. No evidence of bowel wall thickening, distention, or inflammatory changes. Descending and sigmoid diverticulosis. Vascular/Lymphatic: Aortic atherosclerosis. No enlarged abdominal or pelvic lymph nodes. Reproductive: Hysterectomy. Other:  No abdominal wall hernia or abnormality. No ascites. Musculoskeletal: No acute or significant osseous findings. IMPRESSION: 1. No acute intracranial pathology. 2. No fracture or static subluxation of the cervical spine. 3. No evidence of pulmonary embolism. 4. Evaluation of the chest is somewhat limited by dense streak artifact, however no obvious change in infiltrative soft tissue throughout the superior mediastinum encasing the aortic branch vessel origins and extending inferiorly to the subcarinal station and right hilum. 5. 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 6. No evidence of acute traumatic injury to the chest, abdomen, or pelvis. 7. Coronary artery disease. Aortic Atherosclerosis (ICD10-I70.0) and Emphysema (ICD10-J43.9). Electronically Signed   By: Marolyn JONETTA Jaksch M.D.   On: 01/03/2024 14:31   CT Angio Chest PE W/Cm &/Or Wo Cm Result Date: 01/03/2024 CLINICAL DATA:  Frequent falls, head and neck trauma, PE suspected, dizziness, hypotension, tachycardia, elevated LFTs, history of breast cancer * Tracking Code: BO * EXAM: CT HEAD WITHOUT  CONTRAST CT CERVICAL SPINE WITHOUT CONTRAST CT CHEST ANGIOGRAM WITH CONTRAST CT ABDOMEN PELVIS WITH CONTRAST TECHNIQUE: Contiguous axial images were obtained from the base of the skull through the vertex without intravenous contrast. Multidetector CT imaging of the cervical spine was performed without intravenous contrast. Multiplanar CT image reconstructions were also generated. Multidetector CT imaging of the chest was performed in the angiographic phase following bolus administration of contrast. Multi detector CT imaging of the abdomen and pelvis was performed following bolus administration of intravenous contrast. RADIATION DOSE REDUCTION: This exam was performed according to the departmental dose-optimization program which includes automated exposure control, adjustment of the mA and/or kV according to patient size and/or use of iterative reconstruction technique. CONTRAST:  80mL OMNIPAQUE  IOHEXOL  350 MG/ML SOLN COMPARISON:  CT abdomen pelvis, 12/10/2023 CT venogram chest, 11/17/2023 FINDINGS: CT HEAD FINDINGS Brain: No evidence of acute infarction, hemorrhage, hydrocephalus, extra-axial collection or mass lesion/mass effect. Periventricular white matter hypodensity. Vascular: No hyperdense vessel or unexpected calcification. Skull: Normal. Negative for fracture or focal lesion. Sinuses/Orbits: No acute finding. Other: None. CT CERVICAL SPINE FINDINGS Alignment: Minimal degenerative anterolisthesis of C5 on C6. Skull base and vertebrae: No acute fracture. No primary bone lesion or focal pathologic process. Soft tissues and spinal canal: No prevertebral fluid or swelling. No visible canal hematoma. Disc levels:  Mild multilevel cervical disc degenerative disease. Other: None. CT CHEST ANGIOGRAM FINDINGS Cardiovascular: Satisfactory opacification of the pulmonary arteries to the segmental level. No evidence of pulmonary embolism. Normal heart size. Left and right coronary artery calcifications. Unchanged small  pericardial effusion. Scattered aortic atherosclerosis Mediastinum/Nodes: Evaluation somewhat limited by dense streak artifact, however no obvious change in infiltrative soft tissue throughout the superior mediastinum encasing the aortic branch vessel origins (series 5, image 40) and extending inferiorly to the subcarinal station and right hilum (series 5, image 61). No discretely enlarged mediastinal, hilar, or axillary lymph nodes. Thyroid  gland, trachea, and esophagus demonstrate no significant findings. Lungs/Pleura: Mild centrilobular emphysema. No pleural effusion or pneumothorax. Musculoskeletal: No chest wall abnormality. No acute osseous findings. 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 Review of the MIP images confirms the above findings. CT ABDOMEN PELVIS FINDINGS Hepatobiliary: No solid liver abnormality is seen. No gallstones, gallbladder wall thickening, or biliary dilatation. Pancreas: Unremarkable. No pancreatic ductal dilatation or surrounding inflammatory changes. Spleen: Normal in size without significant abnormality. Adrenals/Urinary Tract: Adrenal glands are unremarkable. Kidneys are normal, without renal calculi, solid lesion, or hydronephrosis. Bladder is unremarkable.  Stomach/Bowel: Stomach is within normal limits. Appendix appears normal. No evidence of bowel wall thickening, distention, or inflammatory changes. Descending and sigmoid diverticulosis. Vascular/Lymphatic: Aortic atherosclerosis. No enlarged abdominal or pelvic lymph nodes. Reproductive: Hysterectomy. Other: No abdominal wall hernia or abnormality. No ascites. Musculoskeletal: No acute or significant osseous findings. IMPRESSION: 1. No acute intracranial pathology. 2. No fracture or static subluxation of the cervical spine. 3. No evidence of pulmonary embolism. 4. Evaluation of the chest is somewhat limited by dense streak artifact, however no obvious change in infiltrative soft  tissue throughout the superior mediastinum encasing the aortic branch vessel origins and extending inferiorly to the subcarinal station and right hilum. 5. 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 6. No evidence of acute traumatic injury to the chest, abdomen, or pelvis. 7. Coronary artery disease. Aortic Atherosclerosis (ICD10-I70.0) and Emphysema (ICD10-J43.9). Electronically Signed   By: Marolyn JONETTA Jaksch M.D.   On: 01/03/2024 14:31   DG Chest 1 View Result Date: 01/03/2024 CLINICAL DATA:  Shortness of breath.  Frequent falls. EXAM: CHEST  1 VIEW COMPARISON:  Chest CT 11/17/2023 FINDINGS: Elevated right hemidiaphragm, right pleural effusion on CT not well demonstrated by radiograph. Normal heart size with stable mediastinal contours. Emphysema with bronchial thickening. No pneumothorax or evidence of acute airspace disease. On limited assessment, no acute osseous findings. IMPRESSION: 1. Elevated right hemidiaphragm, right pleural effusion on CT not well demonstrated by radiograph. 2. Emphysema with bronchial thickening. Electronically Signed   By: Andrea Gasman M.D.   On: 01/03/2024 13:45    PERFORMANCE STATUS (ECOG) : 2 - Symptomatic, <50% confined to bed  Review of Systems Unless otherwise noted, a complete review of systems is negative.  Physical Exam General: NAD Cardiovascular: regular rate and rhythm Pulmonary: clear anterior/posterior fields Abdomen: soft, nontender, + bowel sounds GU: no suprapubic tenderness Extremities: BUE/BLE edema, no joint deformities Skin: no rashes Neurological: Weakness but otherwise nonfocal  IMPRESSION/PLAN: Stage IV breast cancer - On treatment with Ribociclib  plus letrozole .  Suggested that patient hold Ribociclib  until resolution of infectious symptoms.  Discussed with Alyson, PharmD.  Upper/lower extremity edema -patient has history of SVC syndrome.  Weight appears stable.  Patient would like to  restart her torsemide  (prescribed by Dr. Florencio), which she has taken in the past and has helped with her swelling.  Suggested that she could try half dose (10 mg) x 3 days to see if symptoms improve.  I recommended follow-up next week but patient declined.  She was also not interested in checking labs or any further workup today.  URI -likely viral with exposure to sick relative.  Offered respiratory panel and/or chest x-ray but patient declined both.  She says she will treat symptomatically.  MD follow-up 2 weeks.  Patient will call if she needs to be seen sooner  Patient expressed understanding and was in agreement with this plan. She also understands that She can call clinic at any time with any questions, concerns, or complaints.   Thank you for allowing me to participate in the care of this very pleasant patient.   Time Total: 15 minutes  Visit consisted of counseling and education dealing with the complex and emotionally intense issues of symptom management in the setting of serious illness.Greater than 50%  of this time was spent counseling and coordinating care related to the above assessment and plan.  Signed by: Fonda Mower, PhD, NP-C

## 2024-01-21 ENCOUNTER — Inpatient Hospital Stay

## 2024-01-21 ENCOUNTER — Encounter: Payer: Self-pay | Admitting: Hospice and Palliative Medicine

## 2024-01-21 ENCOUNTER — Inpatient Hospital Stay: Admitting: Hospice and Palliative Medicine

## 2024-01-21 ENCOUNTER — Other Ambulatory Visit: Payer: Self-pay | Admitting: *Deleted

## 2024-01-21 ENCOUNTER — Telehealth: Payer: Self-pay | Admitting: *Deleted

## 2024-01-21 VITALS — BP 97/56 | HR 94 | Temp 99.2°F | Resp 18 | Ht 62.0 in | Wt 124.3 lb

## 2024-01-21 DIAGNOSIS — I3139 Other pericardial effusion (noninflammatory): Secondary | ICD-10-CM

## 2024-01-21 DIAGNOSIS — Z170 Estrogen receptor positive status [ER+]: Secondary | ICD-10-CM

## 2024-01-21 DIAGNOSIS — I871 Compression of vein: Secondary | ICD-10-CM

## 2024-01-21 DIAGNOSIS — L03114 Cellulitis of left upper limb: Secondary | ICD-10-CM | POA: Diagnosis not present

## 2024-01-21 DIAGNOSIS — C50312 Malignant neoplasm of lower-inner quadrant of left female breast: Secondary | ICD-10-CM

## 2024-01-21 MED ORDER — DOXYCYCLINE HYCLATE 100 MG PO TABS: 100.0000 mg | ORAL_TABLET | Freq: Two times a day (BID) | ORAL | 0 refills | Status: DC

## 2024-01-21 NOTE — Progress Notes (Signed)
 Symptom Management Clinic [facility] at [facility] Telephone:(336) [phone] [phone]) [phone]  Patient Care Team: Dana Eva POUR, PA as PCP - General (Physician Assistant) Rennie Dana SAUNDERS, MD as Consulting Physician (Oncology)   NAME OF PATIENT: Dana Snow  [id number]  06-Mar-1946   DATE OF VISIT: 01/21/24  REASON FOR CONSULT: Dana Snow is a 78 y.o. 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  INTERVAL HISTORY: Patient recently hospitalized with hyperkalemia from diarrhea.  Also had generalized weakness.  Patient on treatment with dose reduced Ribociclib  and letrozole .  Patient was seen yesterday in [facility] with upper respiratory symptoms and also bilateral upper and lower extremity edema.  She presents [name] today saying that her left upper extremity is red, warm, and remains edematous.  No weeping.  No fever or chills.  Denies any neurologic complaints. Denies recent fevers or chills. Denies any easy bleeding or bruising. Reports good appetite and denies weight loss. Denies chest pain. Denies any nausea, vomiting, constipation, or diarrhea. Denies urinary complaints. Patient offers no further specific complaints today.   PAST MEDICAL HISTORY: Past Medical History:  Diagnosis Date   Breast cancer (HCC) 2015   left lumpectomy 04/05/2014 and reexcision 04/27/2014 - radiation treatment   History of COVID-19    Hypertension    Personal history of radiation therapy 2016   Left breast    PAST SURGICAL HISTORY:  Past Surgical History:  Procedure Laterality Date   ABDOMINAL HYSTERECTOMY     BREAST BIOPSY Left 03/15/2014   8:00 us  bx, SCLEROSING ADENOSIS AND COLUMNAR CELL CHANGE    BREAST BIOPSY Left 02/27/2014   invasive lobular carcinoma LIQ   BREAST LUMPECTOMY Left 04/03/2014   [facility], DCIS, ALH, negative LN   BREAST LUMPECTOMY Left 04/27/2014    re-excision for clear margins   COMBINED HYSTEROSCOPY DIAGNOSTIC / D&C     PARTIAL MASTECTOMY WITH AXILLARY SENTINEL LYMPH NODE BIOPSY Left     HEMATOLOGY/ONCOLOGY HISTORY:  Oncology History Overview Note  #DEC 2015- LEFT  BREAST CANCER STAGE IIA [Mixed Ductal +Lobular Ca; T=2 (3.8CM); psN=0; Dana Snow]]  ER/PR- >90%; Her 2 Nue- NEG; Lumpec & SLNBx Oncotype-RS=19 No Chemo; s/p RT [DanaCrystal] April 2016- Arimidex; Stopped Oct 2016; START OCT 2016- Femara ; Sep 19th stopped sec to [place]; sep 19th 2019- START AROMASIN ; stop summer 2021.  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 PET scan- JULY 2025- Confluent abnormal soft tissue along the upper mediastinum. More towards the right side with involvement of the right lung hilum and separate tissue or nodes thoracic inlet on the right side adjacent to the right clavicle corresponding to the finding by prior CT scan. here are 4 hypermetabolic small bony lesions identified including thoracic spine at T1 and T2, the right acetabulum and left scapula worrisome for bony metastatic disease. A thoracic spine MRI may be of some benefit to further characterize the spine lesion. Two small nonspecific areas of asymmetric uptake along skeletal muscle of the left gluteus medius muscle and the left pectoralis muscle.   NGS-pending.   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   # BMD- Osteopenia [may 2016]; AUG 2018- T score= -1.1 [improved] ------------------------------------------------------------   DIAGNOSIS: BREAST CANCER    CURRENT/MOST RECENT THERAPY: Surveillance.  Carcinoma of lower-inner quadrant of left breast in female, estrogen receptor positive (HCC)  04/12/2020 Cancer Staging   Staging form: Breast, AJCC 8th Edition - Clinical: Stage  IB (cT2, cN0, cM0, G2, ER+, PR+, HER2-) - Signed by Rennie Dana SAUNDERS, MD on 04/12/2020     ALLERGIES:  is allergic to percocet [oxycodone-acetaminophen ], codeine, codone [hydrocodone], oxycodone hcl, and penicillins.  MEDICATIONS:  Current Outpatient Medications  Medication Sig Dispense Refill   doxycycline  (VIBRA -TABS) 100 MG tablet Take 1 tablet (100 mg total) by mouth 2 (two) times daily. 20 tablet 0   feeding supplement (ENSURE PLUS HIGH PROTEIN) LIQD Take 237 mLs by mouth 2 (two) times daily between meals. 20000 mL 1   letrozole  (FEMARA ) 2.5 MG tablet Take 1 tablet (2.5 mg total) by mouth daily. 90 tablet 1   magic mouthwash (nystatin, hydrocortisone, diphenhydrAMINE, lidocaine ) suspension 5 mLs 4 (four) times daily as needed for mouth pain.     metoprolol  tartrate (LOPRESSOR ) 25 MG tablet Take 25 mg by mouth 2 (two) times daily.     polyethylene glycol powder (MIRALAX ) 17 GM/SCOOP powder Take 17 g by mouth daily as needed for mild constipation or moderate constipation. Dissolve 1 capful (17g) in 4-8 ounces of liquid and take by mouth daily.     [Paused] torsemide  (DEMADEX ) 20 MG tablet Take 20 mg by mouth in the morning, at noon, in the evening, and at bedtime. (Patient taking differently: Take 10 mg by mouth once.)     traMADol (ULTRAM) 50 MG tablet Take 1 tablet (50 mg total) by mouth every 8 (eight) hours as needed (mild pain). 30 tablet 0   Calcium-Vitamin D  600-200 MG-UNIT per tablet Take 1 tablet by mouth daily. (Patient not taking: Reported on 01/21/2024)     dexamethasone  (DECADRON ) 2 MG tablet Take 1 pill twice a day.  Take the second pill early in the evening.  Take with food. (Patient not taking: Reported on 01/21/2024) 14 tablet 0   ondansetron  (ZOFRAN ) 8 MG tablet One pill every 8 hours as needed for nausea/vomitting. (Patient not taking: Reported on 01/21/2024) 40 tablet 1   ribociclib  succ (KISQALI  200MG  DAILY DOSE) 200 MG Therapy Pack Take 1 tablet daily for 21 days on,  7 days off. Repeat every 28 days. (Patient not taking: Reported on 01/21/2024) 21 tablet 0   vitamin C (ASCORBIC ACID) 500 MG tablet Take 500 mg by mouth daily. (Patient not taking: Reported on 01/21/2024)     No current facility-administered medications for this visit.    VITAL SIGNS: BP (!) 97/56 (BP Location: Right Arm, Patient Position: Sitting, Cuff Size: Normal)   Pulse 94   Temp 99.2 F (37.3 C) (Tympanic)   Resp 18   Ht 5' 2 (1.575 m)   Wt 124 lb 4.8 oz (56.4 kg)   SpO2 95%   BMI 22.73 kg/m  Filed Weights   01/21/24 1121  Weight: 124 lb 4.8 oz (56.4 kg)     Estimated body mass index is 22.73 kg/m as calculated from the following:   Height as of this encounter: 5' 2 (1.575 m).   Weight as of this encounter: 124 lb 4.8 oz (56.4 kg).  LABS: CBC:    Component Value Date/Time   WBC 8.1 01/20/2024 1047   WBC 2.9 (L) 01/04/2024 0319   HGB 11.5 (L) 01/20/2024 1047   HGB 14.8 07/17/2014 1005   HCT 35.8 (L) 01/20/2024 1047   HCT 44.1 07/17/2014 1005   PLT 273 01/20/2024 1047  PLT 227 07/17/2014 1005   MCV 103.2 (H) 01/20/2024 1047   MCV 92 07/17/2014 1005   NEUTROABS 5.0 01/20/2024 1047   NEUTROABS 2.8 07/17/2014 1005   LYMPHSABS 1.7 01/20/2024 1047   LYMPHSABS 1.3 07/17/2014 1005   MONOABS 1.3 (H) 01/20/2024 1047   MONOABS 0.5 07/17/2014 1005   EOSABS 0.0 01/20/2024 1047   EOSABS 0.3 07/17/2014 1005   BASOSABS 0.1 01/20/2024 1047   BASOSABS 0.1 07/17/2014 1005   Comprehensive Metabolic Panel:    Component Value Date/Time   NA 133 (L) 01/20/2024 1046   K 4.1 01/20/2024 1046   K 3.9 03/29/2014 1402   CL 100 01/20/2024 1046   CO2 25 01/20/2024 1046   BUN 17 01/20/2024 1046   CREATININE 0.84 01/20/2024 1046   CREATININE 0.74 07/17/2014 1005   GLUCOSE 82 01/20/2024 1046   CALCIUM 9.2 01/20/2024 1046   AST 56 (H) 01/20/2024 1046   ALT 115 (H) 01/20/2024 1046   ALT 16 07/17/2014 1005   ALKPHOS 63 01/20/2024 1046   ALKPHOS 70 07/17/2014 1005   BILITOT  1.1 01/20/2024 1046   PROT 6.5 01/20/2024 1046   PROT 7.4 07/17/2014 1005   ALBUMIN 3.5 01/20/2024 1046   ALBUMIN 4.4 07/17/2014 1005    RADIOGRAPHIC STUDIES: MR 3D Recon At Scanner Result Date: 01/17/2024 CLINICAL DATA:  Elevated LFTs, no abdominal pain EXAM: MRI ABDOMEN WITHOUT CONTRAST  (INCLUDING MRCP) TECHNIQUE: Multiplanar multisequence MR imaging of the abdomen was performed. Heavily T2-weighted images of the biliary and pancreatic ducts were obtained, and three-dimensional MRCP images were rendered by post processing. COMPARISON:  CT abdomen pelvis, 01/03/2024 FINDINGS: Lower chest: No acute abnormality.  Cardiomegaly. Hepatobiliary: No solid liver abnormality is seen. Gallbladder is packed with numerous tiny gallstones, not radiopaque on prior CT. No gallbladder wall thickening, or biliary dilatation. Pancreas: Unremarkable. No pancreatic ductal dilatation or surrounding inflammatory changes. Spleen: Normal in size without significant abnormality. Adrenals/Urinary Tract: Adrenal glands are unremarkable. Kidneys are normal, without renal calculi, solid lesion, or hydronephrosis. Bladder is unremarkable. Stomach/Bowel: Stomach is within normal limits. Appendix appears normal. No evidence of bowel wall thickening, distention, or inflammatory changes. Vascular/Lymphatic: No significant vascular findings are present. No enlarged abdominal or pelvic lymph nodes. Reproductive: No mass or other significant abnormality. Other: No abdominal wall hernia or abnormality. No ascites. Musculoskeletal: No acute or significant osseous findings. IMPRESSION: 1. Gallbladder is packed with numerous tiny gallstones, not radiopaque on prior CT. No gallbladder wall thickening, choledocholithiasis, or biliary dilatation. 2. No acute noncontrast MR findings in the abdomen. 3. Cardiomegaly. Electronically Signed   By: Marolyn JONETTA Jaksch M.D.   On: 01/17/2024 08:30   MR ABDOMEN MRCP WO CONTRAST Result Date: 01/03/2024 CLINICAL  DATA:  Elevated LFTs, no abdominal pain EXAM: MRI ABDOMEN WITHOUT CONTRAST  (INCLUDING MRCP) TECHNIQUE: Multiplanar multisequence MR imaging of the abdomen was performed. Heavily T2-weighted images of the biliary and pancreatic ducts were obtained, and three-dimensional MRCP images were rendered by post processing. COMPARISON:  CT abdomen pelvis, 01/03/2024 FINDINGS: Lower chest: No acute abnormality.  Cardiomegaly. Hepatobiliary: No solid liver abnormality is seen. Gallbladder is packed with numerous tiny gallstones, not radiopaque on prior CT. No gallbladder wall thickening, or biliary dilatation. Pancreas: Unremarkable. No pancreatic ductal dilatation or surrounding inflammatory changes. Spleen: Normal in size without significant abnormality. Adrenals/Urinary Tract: Adrenal glands are unremarkable. Kidneys are normal, without renal calculi, solid lesion, or hydronephrosis. Bladder is unremarkable. Stomach/Bowel: Stomach is within normal limits. Appendix appears normal. No evidence of bowel wall thickening, distention,  or inflammatory changes. Vascular/Lymphatic: No significant vascular findings are present. No enlarged abdominal or pelvic lymph nodes. Reproductive: No mass or other significant abnormality. Other: No abdominal wall hernia or abnormality. No ascites. Musculoskeletal: No acute or significant osseous findings. IMPRESSION: 1. Gallbladder is packed with numerous tiny gallstones, not radiopaque on prior CT. No gallbladder wall thickening, choledocholithiasis, or biliary dilatation. 2. No acute noncontrast MR findings in the abdomen. 3. Cardiomegaly. Electronically Signed   By: Marolyn JONETTA Jaksch M.D.   On: 01/03/2024 22:32   CT Head Wo Contrast Result Date: 01/03/2024 CLINICAL DATA:  Frequent falls, head and neck trauma, PE suspected, dizziness, hypotension, tachycardia, elevated LFTs, history of breast cancer * Tracking Code: BO * EXAM: CT HEAD WITHOUT CONTRAST CT CERVICAL SPINE WITHOUT CONTRAST CT CHEST  ANGIOGRAM WITH CONTRAST CT ABDOMEN PELVIS WITH CONTRAST TECHNIQUE: Contiguous axial images were obtained from the base of the skull through the vertex without intravenous contrast. Multidetector CT imaging of the cervical spine was performed without intravenous contrast. Multiplanar CT image reconstructions were also generated. Multidetector CT imaging of the chest was performed in the angiographic phase following bolus administration of contrast. Multi detector CT imaging of the abdomen and pelvis was performed following bolus administration of intravenous contrast. RADIATION DOSE REDUCTION: This exam was performed according to the departmental dose-optimization program which includes automated exposure control, adjustment of the mA and/or kV according to patient size and/or use of iterative reconstruction technique. CONTRAST:  80mL OMNIPAQUE  IOHEXOL  350 MG/ML SOLN COMPARISON:  CT abdomen pelvis, 12/10/2023 CT venogram chest, 11/17/2023 FINDINGS: CT HEAD FINDINGS Brain: No evidence of acute infarction, hemorrhage, hydrocephalus, extra-axial collection or mass lesion/mass effect. Periventricular white matter hypodensity. Vascular: No hyperdense vessel or unexpected calcification. Skull: Normal. Negative for fracture or focal lesion. Sinuses/Orbits: No acute finding. Other: None. CT CERVICAL SPINE FINDINGS Alignment: Minimal degenerative anterolisthesis of C5 on C6. Skull base and vertebrae: No acute fracture. No primary bone lesion or focal pathologic process. Soft tissues and spinal canal: No prevertebral fluid or swelling. No visible canal hematoma. Disc levels:  Mild multilevel cervical disc degenerative disease. Other: None. CT CHEST ANGIOGRAM FINDINGS Cardiovascular: Satisfactory opacification of the pulmonary arteries to the segmental level. No evidence of pulmonary embolism. Normal heart size. Left and right coronary artery calcifications. Unchanged small pericardial effusion. Scattered aortic atherosclerosis  Mediastinum/Nodes: Evaluation somewhat limited by dense streak artifact, however no obvious change in infiltrative soft tissue throughout the superior mediastinum encasing the aortic branch vessel origins (series 5, image 40) and extending inferiorly to the subcarinal station and right hilum (series 5, image 61). No discretely enlarged mediastinal, hilar, or axillary lymph nodes. Thyroid  gland, trachea, and esophagus demonstrate no significant findings. Lungs/Pleura: Mild centrilobular emphysema. No pleural effusion or pneumothorax. Musculoskeletal: No chest wall abnormality. No acute osseous findings. 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 Review of the MIP images confirms the above findings. CT ABDOMEN PELVIS FINDINGS Hepatobiliary: No solid liver abnormality is seen. No gallstones, gallbladder wall thickening, or biliary dilatation. Pancreas: Unremarkable. No pancreatic ductal dilatation or surrounding inflammatory changes. Spleen: Normal in size without significant abnormality. Adrenals/Urinary Tract: Adrenal glands are unremarkable. Kidneys are normal, without renal calculi, solid lesion, or hydronephrosis. Bladder is unremarkable. Stomach/Bowel: Stomach is within normal limits. Appendix appears normal. No evidence of bowel wall thickening, distention, or inflammatory changes. Descending and sigmoid diverticulosis. Vascular/Lymphatic: Aortic atherosclerosis. No enlarged abdominal or pelvic lymph nodes. Reproductive: Hysterectomy. Other: No abdominal wall hernia or abnormality. No ascites. Musculoskeletal: No  acute or significant osseous findings. IMPRESSION: 1. No acute intracranial pathology. 2. No fracture or static subluxation of the cervical spine. 3. No evidence of pulmonary embolism. 4. Evaluation of the chest is somewhat limited by dense streak artifact, however no obvious change in infiltrative soft tissue throughout the superior mediastinum encasing the  aortic branch vessel origins and extending inferiorly to the subcarinal station and right hilum. 5. 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 6. No evidence of acute traumatic injury to the chest, abdomen, or pelvis. 7. Coronary artery disease. Aortic Atherosclerosis (ICD10-I70.0) and Emphysema (ICD10-J43.9). Electronically Signed   By: Marolyn JONETTA Jaksch M.D.   On: 01/03/2024 14:31   CT Cervical Spine Wo Contrast Result Date: 01/03/2024 CLINICAL DATA:  Frequent falls, head and neck trauma, PE suspected, dizziness, hypotension, tachycardia, elevated LFTs, history of breast cancer * Tracking Code: BO * EXAM: CT HEAD WITHOUT CONTRAST CT CERVICAL SPINE WITHOUT CONTRAST CT CHEST ANGIOGRAM WITH CONTRAST CT ABDOMEN PELVIS WITH CONTRAST TECHNIQUE: Contiguous axial images were obtained from the base of the skull through the vertex without intravenous contrast. Multidetector CT imaging of the cervical spine was performed without intravenous contrast. Multiplanar CT image reconstructions were also generated. Multidetector CT imaging of the chest was performed in the angiographic phase following bolus administration of contrast. Multi detector CT imaging of the abdomen and pelvis was performed following bolus administration of intravenous contrast. RADIATION DOSE REDUCTION: This exam was performed according to the departmental dose-optimization program which includes automated exposure control, adjustment of the mA and/or kV according to patient size and/or use of iterative reconstruction technique. CONTRAST:  80mL OMNIPAQUE  IOHEXOL  350 MG/ML SOLN COMPARISON:  CT abdomen pelvis, 12/10/2023 CT venogram chest, 11/17/2023 FINDINGS: CT HEAD FINDINGS Brain: No evidence of acute infarction, hemorrhage, hydrocephalus, extra-axial collection or mass lesion/mass effect. Periventricular white matter hypodensity. Vascular: No hyperdense vessel or unexpected calcification. Skull: Normal.  Negative for fracture or focal lesion. Sinuses/Orbits: No acute finding. Other: None. CT CERVICAL SPINE FINDINGS Alignment: Minimal degenerative anterolisthesis of C5 on C6. Skull base and vertebrae: No acute fracture. No primary bone lesion or focal pathologic process. Soft tissues and spinal canal: No prevertebral fluid or swelling. No visible canal hematoma. Disc levels:  Mild multilevel cervical disc degenerative disease. Other: None. CT CHEST ANGIOGRAM FINDINGS Cardiovascular: Satisfactory opacification of the pulmonary arteries to the segmental level. No evidence of pulmonary embolism. Normal heart size. Left and right coronary artery calcifications. Unchanged small pericardial effusion. Scattered aortic atherosclerosis Mediastinum/Nodes: Evaluation somewhat limited by dense streak artifact, however no obvious change in infiltrative soft tissue throughout the superior mediastinum encasing the aortic branch vessel origins (series 5, image 40) and extending inferiorly to the subcarinal station and right hilum (series 5, image 61). No discretely enlarged mediastinal, hilar, or axillary lymph nodes. Thyroid  gland, trachea, and esophagus demonstrate no significant findings. Lungs/Pleura: Mild centrilobular emphysema. No pleural effusion or pneumothorax. Musculoskeletal: No chest wall abnormality. No acute osseous findings. 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 Review of the MIP images confirms the above findings. CT ABDOMEN PELVIS FINDINGS Hepatobiliary: No solid liver abnormality is seen. No gallstones, gallbladder wall thickening, or biliary dilatation. Pancreas: Unremarkable. No pancreatic ductal dilatation or surrounding inflammatory changes. Spleen: Normal in size without significant abnormality. Adrenals/Urinary Tract: Adrenal glands are unremarkable. Kidneys are normal, without renal calculi, solid lesion, or hydronephrosis. Bladder is unremarkable.  Stomach/Bowel: Stomach is within normal limits. Appendix appears normal. No evidence of  bowel wall thickening, distention, or inflammatory changes. Descending and sigmoid diverticulosis. Vascular/Lymphatic: Aortic atherosclerosis. No enlarged abdominal or pelvic lymph nodes. Reproductive: Hysterectomy. Other: No abdominal wall hernia or abnormality. No ascites. Musculoskeletal: No acute or significant osseous findings. IMPRESSION: 1. No acute intracranial pathology. 2. No fracture or static subluxation of the cervical spine. 3. No evidence of pulmonary embolism. 4. Evaluation of the chest is somewhat limited by dense streak artifact, however no obvious change in infiltrative soft tissue throughout the superior mediastinum encasing the aortic branch vessel origins and extending inferiorly to the subcarinal station and right hilum. 5. 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 6. No evidence of acute traumatic injury to the chest, abdomen, or pelvis. 7. Coronary artery disease. Aortic Atherosclerosis (ICD10-I70.0) and Emphysema (ICD10-J43.9). Electronically Signed   By: Marolyn JONETTA Jaksch M.D.   On: 01/03/2024 14:31   CT ABDOMEN PELVIS W CONTRAST Result Date: 01/03/2024 CLINICAL DATA:  Frequent falls, head and neck trauma, PE suspected, dizziness, hypotension, tachycardia, elevated LFTs, history of breast cancer * Tracking Code: BO * EXAM: CT HEAD WITHOUT CONTRAST CT CERVICAL SPINE WITHOUT CONTRAST CT CHEST ANGIOGRAM WITH CONTRAST CT ABDOMEN PELVIS WITH CONTRAST TECHNIQUE: Contiguous axial images were obtained from the base of the skull through the vertex without intravenous contrast. Multidetector CT imaging of the cervical spine was performed without intravenous contrast. Multiplanar CT image reconstructions were also generated. Multidetector CT imaging of the chest was performed in the angiographic phase following bolus administration of contrast. Multi detector CT  imaging of the abdomen and pelvis was performed following bolus administration of intravenous contrast. RADIATION DOSE REDUCTION: This exam was performed according to the departmental dose-optimization program which includes automated exposure control, adjustment of the mA and/or kV according to patient size and/or use of iterative reconstruction technique. CONTRAST:  80mL OMNIPAQUE  IOHEXOL  350 MG/ML SOLN COMPARISON:  CT abdomen pelvis, 12/10/2023 CT venogram chest, 11/17/2023 FINDINGS: CT HEAD FINDINGS Brain: No evidence of acute infarction, hemorrhage, hydrocephalus, extra-axial collection or mass lesion/mass effect. Periventricular white matter hypodensity. Vascular: No hyperdense vessel or unexpected calcification. Skull: Normal. Negative for fracture or focal lesion. Sinuses/Orbits: No acute finding. Other: None. CT CERVICAL SPINE FINDINGS Alignment: Minimal degenerative anterolisthesis of C5 on C6. Skull base and vertebrae: No acute fracture. No primary bone lesion or focal pathologic process. Soft tissues and spinal canal: No prevertebral fluid or swelling. No visible canal hematoma. Disc levels:  Mild multilevel cervical disc degenerative disease. Other: None. CT CHEST ANGIOGRAM FINDINGS Cardiovascular: Satisfactory opacification of the pulmonary arteries to the segmental level. No evidence of pulmonary embolism. Normal heart size. Left and right coronary artery calcifications. Unchanged small pericardial effusion. Scattered aortic atherosclerosis Mediastinum/Nodes: Evaluation somewhat limited by dense streak artifact, however no obvious change in infiltrative soft tissue throughout the superior mediastinum encasing the aortic branch vessel origins (series 5, image 40) and extending inferiorly to the subcarinal station and right hilum (series 5, image 61). No discretely enlarged mediastinal, hilar, or axillary lymph nodes. Thyroid  gland, trachea, and esophagus demonstrate no significant findings.  Lungs/Pleura: Mild centrilobular emphysema. No pleural effusion or pneumothorax. Musculoskeletal: No chest wall abnormality. No acute osseous findings. 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 Review of the MIP images confirms the above findings. CT ABDOMEN PELVIS FINDINGS Hepatobiliary: No solid liver abnormality is seen. No gallstones, gallbladder wall thickening, or biliary dilatation. Pancreas: Unremarkable. No pancreatic ductal dilatation or surrounding inflammatory changes. Spleen: Normal in size without  significant abnormality. Adrenals/Urinary Tract: Adrenal glands are unremarkable. Kidneys are normal, without renal calculi, solid lesion, or hydronephrosis. Bladder is unremarkable. Stomach/Bowel: Stomach is within normal limits. Appendix appears normal. No evidence of bowel wall thickening, distention, or inflammatory changes. Descending and sigmoid diverticulosis. Vascular/Lymphatic: Aortic atherosclerosis. No enlarged abdominal or pelvic lymph nodes. Reproductive: Hysterectomy. Other: No abdominal wall hernia or abnormality. No ascites. Musculoskeletal: No acute or significant osseous findings. IMPRESSION: 1. No acute intracranial pathology. 2. No fracture or static subluxation of the cervical spine. 3. No evidence of pulmonary embolism. 4. Evaluation of the chest is somewhat limited by dense streak artifact, however no obvious change in infiltrative soft tissue throughout the superior mediastinum encasing the aortic branch vessel origins and extending inferiorly to the subcarinal station and right hilum. 5. 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 6. No evidence of acute traumatic injury to the chest, abdomen, or pelvis. 7. Coronary artery disease. Aortic Atherosclerosis (ICD10-I70.0) and Emphysema (ICD10-J43.9). Electronically Signed   By: Marolyn JONETTA Jaksch M.D.   On: 01/03/2024 14:31   CT Angio Chest PE W/Cm  &/Or Wo Cm Result Date: 01/03/2024 CLINICAL DATA:  Frequent falls, head and neck trauma, PE suspected, dizziness, hypotension, tachycardia, elevated LFTs, history of breast cancer * Tracking Code: BO * EXAM: CT HEAD WITHOUT CONTRAST CT CERVICAL SPINE WITHOUT CONTRAST CT CHEST ANGIOGRAM WITH CONTRAST CT ABDOMEN PELVIS WITH CONTRAST TECHNIQUE: Contiguous axial images were obtained from the base of the skull through the vertex without intravenous contrast. Multidetector CT imaging of the cervical spine was performed without intravenous contrast. Multiplanar CT image reconstructions were also generated. Multidetector CT imaging of the chest was performed in the angiographic phase following bolus administration of contrast. Multi detector CT imaging of the abdomen and pelvis was performed following bolus administration of intravenous contrast. RADIATION DOSE REDUCTION: This exam was performed according to the departmental dose-optimization program which includes automated exposure control, adjustment of the mA and/or kV according to patient size and/or use of iterative reconstruction technique. CONTRAST:  80mL OMNIPAQUE  IOHEXOL  350 MG/ML SOLN COMPARISON:  CT abdomen pelvis, 12/10/2023 CT venogram chest, 11/17/2023 FINDINGS: CT HEAD FINDINGS Brain: No evidence of acute infarction, hemorrhage, hydrocephalus, extra-axial collection or mass lesion/mass effect. Periventricular white matter hypodensity. Vascular: No hyperdense vessel or unexpected calcification. Skull: Normal. Negative for fracture or focal lesion. Sinuses/Orbits: No acute finding. Other: None. CT CERVICAL SPINE FINDINGS Alignment: Minimal degenerative anterolisthesis of C5 on C6. Skull base and vertebrae: No acute fracture. No primary bone lesion or focal pathologic process. Soft tissues and spinal canal: No prevertebral fluid or swelling. No visible canal hematoma. Disc levels:  Mild multilevel cervical disc degenerative disease. Other: None. CT CHEST  ANGIOGRAM FINDINGS Cardiovascular: Satisfactory opacification of the pulmonary arteries to the segmental level. No evidence of pulmonary embolism. Normal heart size. Left and right coronary artery calcifications. Unchanged small pericardial effusion. Scattered aortic atherosclerosis Mediastinum/Nodes: Evaluation somewhat limited by dense streak artifact, however no obvious change in infiltrative soft tissue throughout the superior mediastinum encasing the aortic branch vessel origins (series 5, image 40) and extending inferiorly to the subcarinal station and right hilum (series 5, image 61). No discretely enlarged mediastinal, hilar, or axillary lymph nodes. Thyroid  gland, trachea, and esophagus demonstrate no significant findings. Lungs/Pleura: Mild centrilobular emphysema. No pleural effusion or pneumothorax. Musculoskeletal: No chest wall abnormality. No acute osseous findings. 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 Review of the MIP images confirms the  above findings. CT ABDOMEN PELVIS FINDINGS Hepatobiliary: No solid liver abnormality is seen. No gallstones, gallbladder wall thickening, or biliary dilatation. Pancreas: Unremarkable. No pancreatic ductal dilatation or surrounding inflammatory changes. Spleen: Normal in size without significant abnormality. Adrenals/Urinary Tract: Adrenal glands are unremarkable. Kidneys are normal, without renal calculi, solid lesion, or hydronephrosis. Bladder is unremarkable. Stomach/Bowel: Stomach is within normal limits. Appendix appears normal. No evidence of bowel wall thickening, distention, or inflammatory changes. Descending and sigmoid diverticulosis. Vascular/Lymphatic: Aortic atherosclerosis. No enlarged abdominal or pelvic lymph nodes. Reproductive: Hysterectomy. Other: No abdominal wall hernia or abnormality. No ascites. Musculoskeletal: No acute or significant osseous findings. IMPRESSION: 1. No acute intracranial  pathology. 2. No fracture or static subluxation of the cervical spine. 3. No evidence of pulmonary embolism. 4. Evaluation of the chest is somewhat limited by dense streak artifact, however no obvious change in infiltrative soft tissue throughout the superior mediastinum encasing the aortic branch vessel origins and extending inferiorly to the subcarinal station and right hilum. 5. 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 6. No evidence of acute traumatic injury to the chest, abdomen, or pelvis. 7. Coronary artery disease. Aortic Atherosclerosis (ICD10-I70.0) and Emphysema (ICD10-J43.9). Electronically Signed   By: Marolyn JONETTA Jaksch M.D.   On: 01/03/2024 14:31   DG Chest 1 View Result Date: 01/03/2024 CLINICAL DATA:  Shortness of breath.  Frequent falls. EXAM: CHEST  1 VIEW COMPARISON:  Chest CT 11/17/2023 FINDINGS: Elevated right hemidiaphragm, right pleural effusion on CT not well demonstrated by radiograph. Normal heart size with stable mediastinal contours. Emphysema with bronchial thickening. No pneumothorax or evidence of acute airspace disease. On limited assessment, no acute osseous findings. IMPRESSION: 1. Elevated right hemidiaphragm, right pleural effusion on CT not well demonstrated by radiograph. 2. Emphysema with bronchial thickening. Electronically Signed   By: Andrea Gasman M.D.   On: 01/03/2024 13:45    PERFORMANCE STATUS (ECOG) : 2 - Symptomatic, <50% confined to bed  Review of Systems Unless otherwise noted, a complete review of systems is negative.  Physical Exam General: NAD Cardiovascular: regular rate and rhythm Pulmonary: clear anterior/posterior fields Abdomen: soft, nontender, + bowel sounds GU: no suprapubic tenderness Extremities: BUE/BLE edema, no joint deformities Skin: no rashes Neurological: Weakness but otherwise nonfocal    IMPRESSION/PLAN: Stage IV breast cancer - On treatment with Ribociclib  plus  letrozole .  Suggested that patient hold Ribociclib  until resolution of infectious symptoms.    Cellulitis -left upper extremity is now red and warm to touch.  Remains swollen. No fever or chills or signs of systemic infection.  However, appearance of arm appears cellulitic.  Patient declined further workup including labs or ultrasound.  Will start empirically on doxycycline  100 mg twice daily x 10 days.  ED triggers reviewed in detail.  Will bring patient back next week for follow-up.  Patient expressed understanding and was in agreement with this plan. She also understands that She can call clinic at any time with any questions, concerns, or complaints.   Thank you for allowing me to participate in the care of this very pleasant patient.   Time Total: 15 minutes  Visit consisted of counseling and education dealing with the complex and emotionally intense issues of symptom management in the setting of serious illness.Greater than 50%  of this time was spent counseling and coordinating care related to the above assessment and plan.  Signed by: Fonda Mower, PhD, NP-C

## 2024-01-21 NOTE — Progress Notes (Signed)
 C/o lt hand, arm and elbow redness and swelling, 3/10. Swelling in both legs and ankles. She does state there has been some weeping. Having yellow mucus in nose and cough/sob.

## 2024-01-21 NOTE — Telephone Encounter (Signed)
 Patient said that her left hand is swollen  and she says that the thinks that it is going to have infection, she has taken a pill to try to get the edema to go down.  She wanted to have an appointment because of this and I talked to [facility] and he will see her today and I have already called her and she should be over here at 11:30.  She is agreeable to this

## 2024-01-27 ENCOUNTER — Other Ambulatory Visit: Payer: Self-pay | Admitting: Oncology

## 2024-01-27 ENCOUNTER — Inpatient Hospital Stay

## 2024-01-27 ENCOUNTER — Inpatient Hospital Stay (HOSPITAL_BASED_OUTPATIENT_CLINIC_OR_DEPARTMENT_OTHER): Admitting: Hospice and Palliative Medicine

## 2024-01-27 ENCOUNTER — Ambulatory Visit
Admission: RE | Admit: 2024-01-27 | Discharge: 2024-01-27 | Disposition: A | Discharge status: Home / Self Care | Source: Ambulatory Visit | Attending: Hospice and Palliative Medicine | Admitting: Hospice and Palliative Medicine

## 2024-01-27 VITALS — BP 115/61 | HR 75 | Temp 98.0°F | Resp 16 | Ht 62.0 in | Wt 120.2 lb

## 2024-01-27 DIAGNOSIS — C50312 Malignant neoplasm of lower-inner quadrant of left female breast: Secondary | ICD-10-CM | POA: Diagnosis not present

## 2024-01-27 DIAGNOSIS — R600 Localized edema: Secondary | ICD-10-CM

## 2024-01-27 DIAGNOSIS — I3139 Other pericardial effusion (noninflammatory): Secondary | ICD-10-CM

## 2024-01-27 DIAGNOSIS — I871 Compression of vein: Secondary | ICD-10-CM

## 2024-01-27 LAB — BRAIN NATRIURETIC PEPTIDE: B Natriuretic Peptide: 582.8 pg/mL — ABNORMAL HIGH (ref 0.0–100.0)

## 2024-01-27 LAB — BASIC METABOLIC PANEL - CANCER CENTER ONLY
Anion gap: 11 (ref 5–15)
BUN: 28 mg/dL — ABNORMAL HIGH (ref 8–23)
CO2: 26 mmol/L (ref 22–32)
Calcium: 9.2 mg/dL (ref 8.9–10.3)
Chloride: 97 mmol/L — ABNORMAL LOW (ref 98–111)
Creatinine: 0.72 mg/dL (ref 0.44–1.00)
GFR, Estimated: >60 mL/min (ref >60)
Glucose, Bld: 131 mg/dL — ABNORMAL HIGH (ref 70–99)
Potassium: 3.5 mmol/L (ref 3.5–5.1)
Sodium: 134 mmol/L — ABNORMAL LOW (ref 135–145)

## 2024-01-27 MED ORDER — APIXABAN (ELIQUIS) VTE STARTER PACK (10MG AND 5MG): ORAL_TABLET | ORAL | 0 refills | Status: DC

## 2024-01-27 MED ORDER — DOXYCYCLINE HYCLATE 100 MG PO TABS: 100.0000 mg | ORAL_TABLET | Freq: Two times a day (BID) | ORAL | 0 refills | Status: DC

## 2024-01-27 NOTE — Progress Notes (Signed)
 Symptom Management Clinic [facility] at [facility] [phone] [phone]  Patient Care Team: Marikay Eva POUR, PA as PCP - General (Physician Assistant) Rennie Cindy SAUNDERS, MD as Consulting Physician (Oncology)   NAME OF PATIENT: Dana Snow  [id number]  1945/05/25   DATE OF VISIT: 01/27/24  REASON FOR CONSULT: Waverly Tarquinio is a 78 y.o. 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  INTERVAL HISTORY: Patient recently hospitalized with hyperkalemia from diarrhea.  Also had generalized weakness.  Patient on treatment with dose reduced Ribociclib  and letrozole .  Was seen recently in [facility] with left upper arm extremity edema, redness, and knots.  Started on doxycycline  100 mg twice daily x 10 days for cellulitis.  Patient returns [facility] today with for evaluation.  She says that redness and warmth have mostly resolved.  She feels like her and feels better.  It is still edematous but improving with elevation.  No shortness of breath or chest pain.  Denies any neurologic complaints. Denies recent fevers or chills. Denies any easy bleeding or bruising. Reports good appetite and denies weight loss. Denies chest pain. Denies any nausea, vomiting, constipation, or diarrhea. Denies urinary complaints. Patient offers no further specific complaints today.   PAST MEDICAL HISTORY: Past Medical History:  Diagnosis Date   Breast cancer (HCC) 2015   left lumpectomy 04/05/2014 and reexcision 04/27/2014 - radiation treatment   History of COVID-19    Hypertension    Personal history of radiation therapy 2016   Left breast    PAST SURGICAL HISTORY:  Past Surgical History:  Procedure Laterality Date   ABDOMINAL HYSTERECTOMY     BREAST BIOPSY Left 03/15/2014   8:00 us  bx, SCLEROSING ADENOSIS AND COLUMNAR CELL CHANGE    BREAST BIOPSY Left 02/27/2014   invasive  lobular carcinoma LIQ   BREAST LUMPECTOMY Left 04/03/2014   [facility], DCIS, ALH, negative LN   BREAST LUMPECTOMY Left 04/27/2014   re-excision for clear margins   COMBINED HYSTEROSCOPY DIAGNOSTIC / D&C     PARTIAL MASTECTOMY WITH AXILLARY SENTINEL LYMPH NODE BIOPSY Left     HEMATOLOGY/ONCOLOGY HISTORY:  Oncology History Overview Note  #DEC 2015- LEFT  BREAST CANCER STAGE IIA [Mixed Ductal +Lobular Ca; T=2 (3.8CM); psN=0; Dr. Smith]]  ER/PR- >90%; Her 2 Nue- NEG; Lumpec & SLNBx Oncotype-RS=19 No Chemo; s/p RT [Dr.Crystal] April 2016- Arimidex; Stopped Oct 2016; START OCT 2016- Femara ; Sep 19th stopped sec to [place]; sep 19th 2019- START AROMASIN ; stop summer 2021.  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 PET scan- JULY 2025- Confluent abnormal soft tissue along the upper mediastinum. More towards the right side with involvement of the right lung hilum and separate tissue or nodes thoracic inlet on the right side adjacent to the right clavicle corresponding to the finding by prior CT scan. here are 4 hypermetabolic small bony lesions identified including thoracic spine at T1 and T2, the right acetabulum and left scapula worrisome for bony metastatic disease. A thoracic spine MRI may be of some benefit to further characterize the spine lesion. Two small nonspecific areas of asymmetric uptake along skeletal muscle of the left gluteus medius muscle and the left pectoralis muscle.   NGS-pending.   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   # BMD- Osteopenia [may 2016]; AUG 2018- T score= -1.1 [improved] ------------------------------------------------------------   DIAGNOSIS: BREAST CANCER    CURRENT/MOST RECENT THERAPY: Surveillance.    Carcinoma of lower-inner quadrant of left breast in  female, estrogen receptor positive (HCC)  04/12/2020 Cancer Staging   Staging form: Breast, AJCC 8th Edition - Clinical: Stage IB (cT2, cN0, cM0, G2, ER+, PR+, HER2-) - Signed by Rennie Cindy SAUNDERS, MD on 04/12/2020     ALLERGIES:  is allergic to percocet [oxycodone-acetaminophen ], codeine, codone [hydrocodone], oxycodone hcl, and penicillins.  MEDICATIONS:  Current Outpatient Medications  Medication Sig Dispense Refill   feeding supplement (ENSURE PLUS HIGH PROTEIN) LIQD Take 237 mLs by mouth 2 (two) times daily between meals. 20000 mL 1   magic mouthwash (nystatin, hydrocortisone, diphenhydrAMINE, lidocaine ) suspension 5 mLs 4 (four) times daily as needed for mouth pain.     metoprolol  tartrate (LOPRESSOR ) 25 MG tablet Take 25 mg by mouth 2 (two) times daily.     traMADol (ULTRAM) 50 MG tablet Take 1 tablet (50 mg total) by mouth every 8 (eight) hours as needed (mild pain). 30 tablet 0   Calcium-Vitamin D  600-200 MG-UNIT per tablet Take 1 tablet by mouth daily. (Patient not taking: Reported on 01/21/2024)     dexamethasone  (DECADRON ) 2 MG tablet Take 1 pill twice a day.  Take the second pill early in the evening.  Take with food. (Patient not taking: Reported on 01/21/2024) 14 tablet 0   doxycycline  (VIBRA -TABS) 100 MG tablet Take 1 tablet (100 mg total) by mouth 2 (two) times daily. (Patient not taking: Reported on 01/27/2024) 20 tablet 0   letrozole  (FEMARA ) 2.5 MG tablet Take 1 tablet (2.5 mg total) by mouth daily. 90 tablet 1   ondansetron  (ZOFRAN ) 8 MG tablet One pill every 8 hours as needed for nausea/vomitting. (Patient not taking: Reported on 01/21/2024) 40 tablet 1   polyethylene glycol powder (MIRALAX ) 17 GM/SCOOP powder Take 17 g by mouth daily as needed for mild constipation or moderate constipation. Dissolve 1 capful (17g) in 4-8 ounces of liquid and take by mouth daily. (Patient not taking: Reported on 01/27/2024)     ribociclib  succ (KISQALI  200MG  DAILY DOSE) 200 MG Therapy  Pack Take 1 tablet daily for 21 days on, 7 days off. Repeat every 28 days. (Patient not taking: Reported on 01/21/2024) 21 tablet 0   [Paused] torsemide  (DEMADEX ) 20 MG tablet Take 20 mg by mouth in the morning, at noon, in the evening, and at bedtime. (Patient not taking: Reported on 01/27/2024)     vitamin C (ASCORBIC ACID) 500 MG tablet Take 500 mg by mouth daily. (Patient not taking: Reported on 01/27/2024)     No current facility-administered medications for this visit.    VITAL SIGNS: BP 115/61 (BP Location: Right Arm)   Pulse 75   Temp 98 F (36.7 C) (Oral)   Resp 16   Ht 5' 2 (1.575 m)   Wt 120 lb 3.2 oz (54.5 kg)   BMI 21.98 kg/m  Filed Weights   01/27/24 1359  Weight: 120 lb 3.2 oz (54.5 kg)     Estimated body mass index is 21.98 kg/m as calculated from the following:   Height as of this encounter: 5' 2 (1.575 m).   Weight as of this encounter: 120 lb 3.2 oz (54.5 kg).  LABS: CBC:    Component Value Date/Time   WBC 8.1 01/20/2024 1047   WBC 2.9 (L) 01/04/2024 0319   HGB 11.5 (L)  01/20/2024 1047   HGB 14.8 07/17/2014 1005   HCT 35.8 (L) 01/20/2024 1047   HCT 44.1 07/17/2014 1005   PLT 273 01/20/2024 1047   PLT 227 07/17/2014 1005   MCV 103.2 (H) 01/20/2024 1047   MCV 92 07/17/2014 1005   NEUTROABS 5.0 01/20/2024 1047   NEUTROABS 2.8 07/17/2014 1005   LYMPHSABS 1.7 01/20/2024 1047   LYMPHSABS 1.3 07/17/2014 1005   MONOABS 1.3 (H) 01/20/2024 1047   MONOABS 0.5 07/17/2014 1005   EOSABS 0.0 01/20/2024 1047   EOSABS 0.3 07/17/2014 1005   BASOSABS 0.1 01/20/2024 1047   BASOSABS 0.1 07/17/2014 1005   Comprehensive Metabolic Panel:    Component Value Date/Time   NA 134 (L) 01/27/2024 1332   K 3.5 01/27/2024 1332   K 3.9 03/29/2014 1402   CL 97 (L) 01/27/2024 1332   CO2 26 01/27/2024 1332   BUN 28 (H) 01/27/2024 1332   CREATININE 0.72 01/27/2024 1332   CREATININE 0.74 07/17/2014 1005   GLUCOSE 131 (H) 01/27/2024 1332   CALCIUM 9.2 01/27/2024 1332    AST 56 (H) 01/20/2024 1046   ALT 115 (H) 01/20/2024 1046   ALT 16 07/17/2014 1005   ALKPHOS 63 01/20/2024 1046   ALKPHOS 70 07/17/2014 1005   BILITOT 1.1 01/20/2024 1046   PROT 6.5 01/20/2024 1046   PROT 7.4 07/17/2014 1005   ALBUMIN 3.5 01/20/2024 1046   ALBUMIN 4.4 07/17/2014 1005    RADIOGRAPHIC STUDIES: MR 3D Recon At Scanner Result Date: 01/17/2024 CLINICAL DATA:  Elevated LFTs, no abdominal pain EXAM: MRI ABDOMEN WITHOUT CONTRAST  (INCLUDING MRCP) TECHNIQUE: Multiplanar multisequence MR imaging of the abdomen was performed. Heavily T2-weighted images of the biliary and pancreatic ducts were obtained, and three-dimensional MRCP images were rendered by post processing. COMPARISON:  CT abdomen pelvis, 01/03/2024 FINDINGS: Lower chest: No acute abnormality.  Cardiomegaly. Hepatobiliary: No solid liver abnormality is seen. Gallbladder is packed with numerous tiny gallstones, not radiopaque on prior CT. No gallbladder wall thickening, or biliary dilatation. Pancreas: Unremarkable. No pancreatic ductal dilatation or surrounding inflammatory changes. Spleen: Normal in size without significant abnormality. Adrenals/Urinary Tract: Adrenal glands are unremarkable. Kidneys are normal, without renal calculi, solid lesion, or hydronephrosis. Bladder is unremarkable. Stomach/Bowel: Stomach is within normal limits. Appendix appears normal. No evidence of bowel wall thickening, distention, or inflammatory changes. Vascular/Lymphatic: No significant vascular findings are present. No enlarged abdominal or pelvic lymph nodes. Reproductive: No mass or other significant abnormality. Other: No abdominal wall hernia or abnormality. No ascites. Musculoskeletal: No acute or significant osseous findings. IMPRESSION: 1. Gallbladder is packed with numerous tiny gallstones, not radiopaque on prior CT. No gallbladder wall thickening, choledocholithiasis, or biliary dilatation. 2. No acute noncontrast MR findings in the  abdomen. 3. Cardiomegaly. Electronically Signed   By: Marolyn JONETTA Jaksch M.D.   On: 01/17/2024 08:30   MR ABDOMEN MRCP WO CONTRAST Result Date: 01/03/2024 CLINICAL DATA:  Elevated LFTs, no abdominal pain EXAM: MRI ABDOMEN WITHOUT CONTRAST  (INCLUDING MRCP) TECHNIQUE: Multiplanar multisequence MR imaging of the abdomen was performed. Heavily T2-weighted images of the biliary and pancreatic ducts were obtained, and three-dimensional MRCP images were rendered by post processing. COMPARISON:  CT abdomen pelvis, 01/03/2024 FINDINGS: Lower chest: No acute abnormality.  Cardiomegaly. Hepatobiliary: No solid liver abnormality is seen. Gallbladder is packed with numerous tiny gallstones, not radiopaque on prior CT. No gallbladder wall thickening, or biliary dilatation. Pancreas: Unremarkable. No pancreatic ductal dilatation or surrounding inflammatory changes. Spleen: Normal in size without significant abnormality. Adrenals/Urinary Tract: Adrenal  glands are unremarkable. Kidneys are normal, without renal calculi, solid lesion, or hydronephrosis. Bladder is unremarkable. Stomach/Bowel: Stomach is within normal limits. Appendix appears normal. No evidence of bowel wall thickening, distention, or inflammatory changes. Vascular/Lymphatic: No significant vascular findings are present. No enlarged abdominal or pelvic lymph nodes. Reproductive: No mass or other significant abnormality. Other: No abdominal wall hernia or abnormality. No ascites. Musculoskeletal: No acute or significant osseous findings. IMPRESSION: 1. Gallbladder is packed with numerous tiny gallstones, not radiopaque on prior CT. No gallbladder wall thickening, choledocholithiasis, or biliary dilatation. 2. No acute noncontrast MR findings in the abdomen. 3. Cardiomegaly. Electronically Signed   By: Marolyn JONETTA Jaksch M.D.   On: 01/03/2024 22:32   CT Head Wo Contrast Result Date: 01/03/2024 CLINICAL DATA:  Frequent falls, head and neck trauma, PE suspected, dizziness,  hypotension, tachycardia, elevated LFTs, history of breast cancer * Tracking Code: BO * EXAM: CT HEAD WITHOUT CONTRAST CT CERVICAL SPINE WITHOUT CONTRAST CT CHEST ANGIOGRAM WITH CONTRAST CT ABDOMEN PELVIS WITH CONTRAST TECHNIQUE: Contiguous axial images were obtained from the base of the skull through the vertex without intravenous contrast. Multidetector CT imaging of the cervical spine was performed without intravenous contrast. Multiplanar CT image reconstructions were also generated. Multidetector CT imaging of the chest was performed in the angiographic phase following bolus administration of contrast. Multi detector CT imaging of the abdomen and pelvis was performed following bolus administration of intravenous contrast. RADIATION DOSE REDUCTION: This exam was performed according to the departmental dose-optimization program which includes automated exposure control, adjustment of the mA and/or kV according to patient size and/or use of iterative reconstruction technique. CONTRAST:  80mL OMNIPAQUE  IOHEXOL  350 MG/ML SOLN COMPARISON:  CT abdomen pelvis, 12/10/2023 CT venogram chest, 11/17/2023 FINDINGS: CT HEAD FINDINGS Brain: No evidence of acute infarction, hemorrhage, hydrocephalus, extra-axial collection or mass lesion/mass effect. Periventricular white matter hypodensity. Vascular: No hyperdense vessel or unexpected calcification. Skull: Normal. Negative for fracture or focal lesion. Sinuses/Orbits: No acute finding. Other: None. CT CERVICAL SPINE FINDINGS Alignment: Minimal degenerative anterolisthesis of C5 on C6. Skull base and vertebrae: No acute fracture. No primary bone lesion or focal pathologic process. Soft tissues and spinal canal: No prevertebral fluid or swelling. No visible canal hematoma. Disc levels:  Mild multilevel cervical disc degenerative disease. Other: None. CT CHEST ANGIOGRAM FINDINGS Cardiovascular: Satisfactory opacification of the pulmonary arteries to the segmental level. No  evidence of pulmonary embolism. Normal heart size. Left and right coronary artery calcifications. Unchanged small pericardial effusion. Scattered aortic atherosclerosis Mediastinum/Nodes: Evaluation somewhat limited by dense streak artifact, however no obvious change in infiltrative soft tissue throughout the superior mediastinum encasing the aortic branch vessel origins (series 5, image 40) and extending inferiorly to the subcarinal station and right hilum (series 5, image 61). No discretely enlarged mediastinal, hilar, or axillary lymph nodes. Thyroid  gland, trachea, and esophagus demonstrate no significant findings. Lungs/Pleura: Mild centrilobular emphysema. No pleural effusion or pneumothorax. Musculoskeletal: No chest wall abnormality. No acute osseous findings. 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 Review of the MIP images confirms the above findings. CT ABDOMEN PELVIS FINDINGS Hepatobiliary: No solid liver abnormality is seen. No gallstones, gallbladder wall thickening, or biliary dilatation. Pancreas: Unremarkable. No pancreatic ductal dilatation or surrounding inflammatory changes. Spleen: Normal in size without significant abnormality. Adrenals/Urinary Tract: Adrenal glands are unremarkable. Kidneys are normal, without renal calculi, solid lesion, or hydronephrosis. Bladder is unremarkable. Stomach/Bowel: Stomach is within normal limits. Appendix appears normal. No evidence of bowel wall  thickening, distention, or inflammatory changes. Descending and sigmoid diverticulosis. Vascular/Lymphatic: Aortic atherosclerosis. No enlarged abdominal or pelvic lymph nodes. Reproductive: Hysterectomy. Other: No abdominal wall hernia or abnormality. No ascites. Musculoskeletal: No acute or significant osseous findings. IMPRESSION: 1. No acute intracranial pathology. 2. No fracture or static subluxation of the cervical spine. 3. No evidence of pulmonary embolism. 4.  Evaluation of the chest is somewhat limited by dense streak artifact, however no obvious change in infiltrative soft tissue throughout the superior mediastinum encasing the aortic branch vessel origins and extending inferiorly to the subcarinal station and right hilum. 5. 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 6. No evidence of acute traumatic injury to the chest, abdomen, or pelvis. 7. Coronary artery disease. Aortic Atherosclerosis (ICD10-I70.0) and Emphysema (ICD10-J43.9). Electronically Signed   By: Marolyn JONETTA Jaksch M.D.   On: 01/03/2024 14:31   CT Cervical Spine Wo Contrast Result Date: 01/03/2024 CLINICAL DATA:  Frequent falls, head and neck trauma, PE suspected, dizziness, hypotension, tachycardia, elevated LFTs, history of breast cancer * Tracking Code: BO * EXAM: CT HEAD WITHOUT CONTRAST CT CERVICAL SPINE WITHOUT CONTRAST CT CHEST ANGIOGRAM WITH CONTRAST CT ABDOMEN PELVIS WITH CONTRAST TECHNIQUE: Contiguous axial images were obtained from the base of the skull through the vertex without intravenous contrast. Multidetector CT imaging of the cervical spine was performed without intravenous contrast. Multiplanar CT image reconstructions were also generated. Multidetector CT imaging of the chest was performed in the angiographic phase following bolus administration of contrast. Multi detector CT imaging of the abdomen and pelvis was performed following bolus administration of intravenous contrast. RADIATION DOSE REDUCTION: This exam was performed according to the departmental dose-optimization program which includes automated exposure control, adjustment of the mA and/or kV according to patient size and/or use of iterative reconstruction technique. CONTRAST:  80mL OMNIPAQUE  IOHEXOL  350 MG/ML SOLN COMPARISON:  CT abdomen pelvis, 12/10/2023 CT venogram chest, 11/17/2023 FINDINGS: CT HEAD FINDINGS Brain: No evidence of acute infarction, hemorrhage,  hydrocephalus, extra-axial collection or mass lesion/mass effect. Periventricular white matter hypodensity. Vascular: No hyperdense vessel or unexpected calcification. Skull: Normal. Negative for fracture or focal lesion. Sinuses/Orbits: No acute finding. Other: None. CT CERVICAL SPINE FINDINGS Alignment: Minimal degenerative anterolisthesis of C5 on C6. Skull base and vertebrae: No acute fracture. No primary bone lesion or focal pathologic process. Soft tissues and spinal canal: No prevertebral fluid or swelling. No visible canal hematoma. Disc levels:  Mild multilevel cervical disc degenerative disease. Other: None. CT CHEST ANGIOGRAM FINDINGS Cardiovascular: Satisfactory opacification of the pulmonary arteries to the segmental level. No evidence of pulmonary embolism. Normal heart size. Left and right coronary artery calcifications. Unchanged small pericardial effusion. Scattered aortic atherosclerosis Mediastinum/Nodes: Evaluation somewhat limited by dense streak artifact, however no obvious change in infiltrative soft tissue throughout the superior mediastinum encasing the aortic branch vessel origins (series 5, image 40) and extending inferiorly to the subcarinal station and right hilum (series 5, image 61). No discretely enlarged mediastinal, hilar, or axillary lymph nodes. Thyroid  gland, trachea, and esophagus demonstrate no significant findings. Lungs/Pleura: Mild centrilobular emphysema. No pleural effusion or pneumothorax. Musculoskeletal: No chest wall abnormality. No acute osseous findings. 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 Review of the MIP images confirms the above findings. CT ABDOMEN PELVIS FINDINGS Hepatobiliary: No solid liver abnormality is seen. No gallstones, gallbladder wall thickening, or biliary dilatation. Pancreas: Unremarkable. No pancreatic ductal dilatation or surrounding inflammatory changes. Spleen: Normal in size without significant  abnormality. Adrenals/Urinary Tract: Adrenal glands are unremarkable. Kidneys are normal, without renal calculi, solid lesion, or hydronephrosis. Bladder is unremarkable. Stomach/Bowel: Stomach is within normal limits. Appendix appears normal. No evidence of bowel wall thickening, distention, or inflammatory changes. Descending and sigmoid diverticulosis. Vascular/Lymphatic: Aortic atherosclerosis. No enlarged abdominal or pelvic lymph nodes. Reproductive: Hysterectomy. Other: No abdominal wall hernia or abnormality. No ascites. Musculoskeletal: No acute or significant osseous findings. IMPRESSION: 1. No acute intracranial pathology. 2. No fracture or static subluxation of the cervical spine. 3. No evidence of pulmonary embolism. 4. Evaluation of the chest is somewhat limited by dense streak artifact, however no obvious change in infiltrative soft tissue throughout the superior mediastinum encasing the aortic branch vessel origins and extending inferiorly to the subcarinal station and right hilum. 5. 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 6. No evidence of acute traumatic injury to the chest, abdomen, or pelvis. 7. Coronary artery disease. Aortic Atherosclerosis (ICD10-I70.0) and Emphysema (ICD10-J43.9). Electronically Signed   By: Marolyn JONETTA Jaksch M.D.   On: 01/03/2024 14:31   CT ABDOMEN PELVIS W CONTRAST Result Date: 01/03/2024 CLINICAL DATA:  Frequent falls, head and neck trauma, PE suspected, dizziness, hypotension, tachycardia, elevated LFTs, history of breast cancer * Tracking Code: BO * EXAM: CT HEAD WITHOUT CONTRAST CT CERVICAL SPINE WITHOUT CONTRAST CT CHEST ANGIOGRAM WITH CONTRAST CT ABDOMEN PELVIS WITH CONTRAST TECHNIQUE: Contiguous axial images were obtained from the base of the skull through the vertex without intravenous contrast. Multidetector CT imaging of the cervical spine was performed without intravenous contrast. Multiplanar CT image  reconstructions were also generated. Multidetector CT imaging of the chest was performed in the angiographic phase following bolus administration of contrast. Multi detector CT imaging of the abdomen and pelvis was performed following bolus administration of intravenous contrast. RADIATION DOSE REDUCTION: This exam was performed according to the departmental dose-optimization program which includes automated exposure control, adjustment of the mA and/or kV according to patient size and/or use of iterative reconstruction technique. CONTRAST:  80mL OMNIPAQUE  IOHEXOL  350 MG/ML SOLN COMPARISON:  CT abdomen pelvis, 12/10/2023 CT venogram chest, 11/17/2023 FINDINGS: CT HEAD FINDINGS Brain: No evidence of acute infarction, hemorrhage, hydrocephalus, extra-axial collection or mass lesion/mass effect. Periventricular white matter hypodensity. Vascular: No hyperdense vessel or unexpected calcification. Skull: Normal. Negative for fracture or focal lesion. Sinuses/Orbits: No acute finding. Other: None. CT CERVICAL SPINE FINDINGS Alignment: Minimal degenerative anterolisthesis of C5 on C6. Skull base and vertebrae: No acute fracture. No primary bone lesion or focal pathologic process. Soft tissues and spinal canal: No prevertebral fluid or swelling. No visible canal hematoma. Disc levels:  Mild multilevel cervical disc degenerative disease. Other: None. CT CHEST ANGIOGRAM FINDINGS Cardiovascular: Satisfactory opacification of the pulmonary arteries to the segmental level. No evidence of pulmonary embolism. Normal heart size. Left and right coronary artery calcifications. Unchanged small pericardial effusion. Scattered aortic atherosclerosis Mediastinum/Nodes: Evaluation somewhat limited by dense streak artifact, however no obvious change in infiltrative soft tissue throughout the superior mediastinum encasing the aortic branch vessel origins (series 5, image 40) and extending inferiorly to the subcarinal station and right hilum  (series 5, image 61). No discretely enlarged mediastinal, hilar, or axillary lymph nodes. Thyroid  gland, trachea, and esophagus demonstrate no significant findings. Lungs/Pleura: Mild centrilobular emphysema. No pleural effusion or pneumothorax. Musculoskeletal: No chest wall abnormality. No acute osseous findings. 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 Review of the MIP images confirms the above findings. CT ABDOMEN  PELVIS FINDINGS Hepatobiliary: No solid liver abnormality is seen. No gallstones, gallbladder wall thickening, or biliary dilatation. Pancreas: Unremarkable. No pancreatic ductal dilatation or surrounding inflammatory changes. Spleen: Normal in size without significant abnormality. Adrenals/Urinary Tract: Adrenal glands are unremarkable. Kidneys are normal, without renal calculi, solid lesion, or hydronephrosis. Bladder is unremarkable. Stomach/Bowel: Stomach is within normal limits. Appendix appears normal. No evidence of bowel wall thickening, distention, or inflammatory changes. Descending and sigmoid diverticulosis. Vascular/Lymphatic: Aortic atherosclerosis. No enlarged abdominal or pelvic lymph nodes. Reproductive: Hysterectomy. Other: No abdominal wall hernia or abnormality. No ascites. Musculoskeletal: No acute or significant osseous findings. IMPRESSION: 1. No acute intracranial pathology. 2. No fracture or static subluxation of the cervical spine. 3. No evidence of pulmonary embolism. 4. Evaluation of the chest is somewhat limited by dense streak artifact, however no obvious change in infiltrative soft tissue throughout the superior mediastinum encasing the aortic branch vessel origins and extending inferiorly to the subcarinal station and right hilum. 5. 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 6. No evidence of acute traumatic injury to the chest, abdomen, or pelvis. 7. Coronary artery disease.  Aortic Atherosclerosis (ICD10-I70.0) and Emphysema (ICD10-J43.9). Electronically Signed   By: Marolyn JONETTA Jaksch M.D.   On: 01/03/2024 14:31   CT Angio Chest PE W/Cm &/Or Wo Cm Result Date: 01/03/2024 CLINICAL DATA:  Frequent falls, head and neck trauma, PE suspected, dizziness, hypotension, tachycardia, elevated LFTs, history of breast cancer * Tracking Code: BO * EXAM: CT HEAD WITHOUT CONTRAST CT CERVICAL SPINE WITHOUT CONTRAST CT CHEST ANGIOGRAM WITH CONTRAST CT ABDOMEN PELVIS WITH CONTRAST TECHNIQUE: Contiguous axial images were obtained from the base of the skull through the vertex without intravenous contrast. Multidetector CT imaging of the cervical spine was performed without intravenous contrast. Multiplanar CT image reconstructions were also generated. Multidetector CT imaging of the chest was performed in the angiographic phase following bolus administration of contrast. Multi detector CT imaging of the abdomen and pelvis was performed following bolus administration of intravenous contrast. RADIATION DOSE REDUCTION: This exam was performed according to the departmental dose-optimization program which includes automated exposure control, adjustment of the mA and/or kV according to patient size and/or use of iterative reconstruction technique. CONTRAST:  80mL OMNIPAQUE  IOHEXOL  350 MG/ML SOLN COMPARISON:  CT abdomen pelvis, 12/10/2023 CT venogram chest, 11/17/2023 FINDINGS: CT HEAD FINDINGS Brain: No evidence of acute infarction, hemorrhage, hydrocephalus, extra-axial collection or mass lesion/mass effect. Periventricular white matter hypodensity. Vascular: No hyperdense vessel or unexpected calcification. Skull: Normal. Negative for fracture or focal lesion. Sinuses/Orbits: No acute finding. Other: None. CT CERVICAL SPINE FINDINGS Alignment: Minimal degenerative anterolisthesis of C5 on C6. Skull base and vertebrae: No acute fracture. No primary bone lesion or focal pathologic process. Soft tissues and spinal  canal: No prevertebral fluid or swelling. No visible canal hematoma. Disc levels:  Mild multilevel cervical disc degenerative disease. Other: None. CT CHEST ANGIOGRAM FINDINGS Cardiovascular: Satisfactory opacification of the pulmonary arteries to the segmental level. No evidence of pulmonary embolism. Normal heart size. Left and right coronary artery calcifications. Unchanged small pericardial effusion. Scattered aortic atherosclerosis Mediastinum/Nodes: Evaluation somewhat limited by dense streak artifact, however no obvious change in infiltrative soft tissue throughout the superior mediastinum encasing the aortic branch vessel origins (series 5, image 40) and extending inferiorly to the subcarinal station and right hilum (series 5, image 61). No discretely enlarged mediastinal, hilar, or axillary lymph nodes. Thyroid  gland, trachea, and esophagus demonstrate no significant findings. Lungs/Pleura: Mild centrilobular emphysema. No pleural  effusion or pneumothorax. Musculoskeletal: No chest wall abnormality. No acute osseous findings. 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 Review of the MIP images confirms the above findings. CT ABDOMEN PELVIS FINDINGS Hepatobiliary: No solid liver abnormality is seen. No gallstones, gallbladder wall thickening, or biliary dilatation. Pancreas: Unremarkable. No pancreatic ductal dilatation or surrounding inflammatory changes. Spleen: Normal in size without significant abnormality. Adrenals/Urinary Tract: Adrenal glands are unremarkable. Kidneys are normal, without renal calculi, solid lesion, or hydronephrosis. Bladder is unremarkable. Stomach/Bowel: Stomach is within normal limits. Appendix appears normal. No evidence of bowel wall thickening, distention, or inflammatory changes. Descending and sigmoid diverticulosis. Vascular/Lymphatic: Aortic atherosclerosis. No enlarged abdominal or pelvic lymph nodes. Reproductive: Hysterectomy.  Other: No abdominal wall hernia or abnormality. No ascites. Musculoskeletal: No acute or significant osseous findings. IMPRESSION: 1. No acute intracranial pathology. 2. No fracture or static subluxation of the cervical spine. 3. No evidence of pulmonary embolism. 4. Evaluation of the chest is somewhat limited by dense streak artifact, however no obvious change in infiltrative soft tissue throughout the superior mediastinum encasing the aortic branch vessel origins and extending inferiorly to the subcarinal station and right hilum. 5. 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 6. No evidence of acute traumatic injury to the chest, abdomen, or pelvis. 7. Coronary artery disease. Aortic Atherosclerosis (ICD10-I70.0) and Emphysema (ICD10-J43.9). Electronically Signed   By: Marolyn JONETTA Jaksch M.D.   On: 01/03/2024 14:31   DG Chest 1 View Result Date: 01/03/2024 CLINICAL DATA:  Shortness of breath.  Frequent falls. EXAM: CHEST  1 VIEW COMPARISON:  Chest CT 11/17/2023 FINDINGS: Elevated right hemidiaphragm, right pleural effusion on CT not well demonstrated by radiograph. Normal heart size with stable mediastinal contours. Emphysema with bronchial thickening. No pneumothorax or evidence of acute airspace disease. On limited assessment, no acute osseous findings. IMPRESSION: 1. Elevated right hemidiaphragm, right pleural effusion on CT not well demonstrated by radiograph. 2. Emphysema with bronchial thickening. Electronically Signed   By: Andrea Gasman M.D.   On: 01/03/2024 13:45    PERFORMANCE STATUS (ECOG) : 2 - Symptomatic, <50% confined to bed  Review of Systems Unless otherwise noted, a complete review of systems is negative.  Physical Exam General: NAD Cardiovascular: regular rate and rhythm Pulmonary: clear anterior/posterior fields Abdomen: soft, nontender, + bowel sounds GU: no suprapubic tenderness Extremities: BUE/BLE edema, no joint  deformities Skin: no rashes Neurological: Weakness but otherwise nonfocal    IMPRESSION/PLAN: Stage IV breast cancer - On treatment with Ribociclib  plus letrozole .  Suggested that patient hold Ribociclib  until resolution of infectious symptoms.    Cellulitis - improving.  Will extend doxycycline  for total of 14 days.  Will obtain ultrasound left upper extremity, although suspicion for DVT is minimal, I would like to rule this out.  Will send referrals for rehab screening/Maureen and bring patient back next week for reevaluation.  Will bring patient back next week for follow-up.  Patient expressed understanding and was in agreement with this plan. She also understands that She can call clinic at any time with any questions, concerns, or complaints.   Thank you for allowing me to participate in the care of this very pleasant patient.   Time Total: 15 minutes  Visit consisted of counseling and education dealing with the complex and emotionally intense issues of symptom management in the setting of serious illness.Greater than 50%  of this time was spent counseling and coordinating care related to the above  assessment and plan.  Signed by: Fonda Mower, PhD, NP-C

## 2024-01-27 NOTE — Progress Notes (Signed)
 Patient says she thinks that the left arm with edema looks better about redness.

## 2024-01-27 NOTE — Progress Notes (Signed)
 Informed patient regarding positive dvt LUE. [facility]  Dr. Annah Skene, MD, MPH [place] [facility] [phone] 01/27/2024 5:47 PM

## 2024-01-31 ENCOUNTER — Telehealth: Payer: Self-pay

## 2024-01-31 ENCOUNTER — Ambulatory Visit: Admitting: Radiation Oncology

## 2024-01-31 NOTE — Telephone Encounter (Signed)
 Patient diagnosed with left arm DVT on 01/27/24.  Is taking the Eliquis as prescribed.  Noticed increased left arm swelling from wrist up to shoulder 2 days ago.  There is no pain, heat, or discoloration of left arm.  Also noticing blood tinged mucous when blowing nose since taking Eliquis

## 2024-02-01 ENCOUNTER — Other Ambulatory Visit: Payer: Self-pay | Admitting: *Deleted

## 2024-02-01 DIAGNOSIS — R600 Localized edema: Secondary | ICD-10-CM

## 2024-02-01 DIAGNOSIS — C50919 Malignant neoplasm of unspecified site of unspecified female breast: Secondary | ICD-10-CM

## 2024-02-02 ENCOUNTER — Inpatient Hospital Stay: Admitting: Hospice and Palliative Medicine

## 2024-02-02 ENCOUNTER — Inpatient Hospital Stay: Attending: Internal Medicine

## 2024-02-02 ENCOUNTER — Other Ambulatory Visit: Payer: Self-pay | Admitting: *Deleted

## 2024-02-02 ENCOUNTER — Ambulatory Visit
Admission: RE | Admit: 2024-02-02 | Discharge: 2024-02-02 | Disposition: A | Discharge status: Home / Self Care | Source: Ambulatory Visit | Attending: Radiation Oncology | Admitting: Radiation Oncology

## 2024-02-02 ENCOUNTER — Encounter: Payer: Self-pay | Admitting: Radiation Oncology

## 2024-02-02 VITALS — BP 150/82 | HR 84 | Temp 97.7°F | Resp 16 | Ht 62.0 in | Wt 124.0 lb

## 2024-02-02 VITALS — BP 150/82 | HR 84 | Temp 97.6°F | Resp 16 | Wt 124.0 lb

## 2024-02-02 DIAGNOSIS — Z170 Estrogen receptor positive status [ER+]: Secondary | ICD-10-CM | POA: Diagnosis not present

## 2024-02-02 DIAGNOSIS — R600 Localized edema: Secondary | ICD-10-CM | POA: Diagnosis not present

## 2024-02-02 DIAGNOSIS — I871 Compression of vein: Secondary | ICD-10-CM | POA: Diagnosis not present

## 2024-02-02 DIAGNOSIS — I82612 Acute embolism and thrombosis of superficial veins of left upper extremity: Secondary | ICD-10-CM | POA: Insufficient documentation

## 2024-02-02 DIAGNOSIS — Z79811 Long term (current) use of aromatase inhibitors: Secondary | ICD-10-CM | POA: Diagnosis not present

## 2024-02-02 DIAGNOSIS — Z885 Allergy status to narcotic agent status: Secondary | ICD-10-CM | POA: Insufficient documentation

## 2024-02-02 DIAGNOSIS — H5702 Anisocoria: Secondary | ICD-10-CM | POA: Diagnosis not present

## 2024-02-02 DIAGNOSIS — Z923 Personal history of irradiation: Secondary | ICD-10-CM | POA: Diagnosis not present

## 2024-02-02 DIAGNOSIS — C770 Secondary and unspecified malignant neoplasm of lymph nodes of head, face and neck: Secondary | ICD-10-CM | POA: Diagnosis present

## 2024-02-02 DIAGNOSIS — I82622 Acute embolism and thrombosis of deep veins of left upper extremity: Secondary | ICD-10-CM | POA: Insufficient documentation

## 2024-02-02 DIAGNOSIS — C50312 Malignant neoplasm of lower-inner quadrant of left female breast: Secondary | ICD-10-CM | POA: Diagnosis present

## 2024-02-02 DIAGNOSIS — Z90710 Acquired absence of both cervix and uterus: Secondary | ICD-10-CM | POA: Insufficient documentation

## 2024-02-02 DIAGNOSIS — Z7901 Long term (current) use of anticoagulants: Secondary | ICD-10-CM | POA: Insufficient documentation

## 2024-02-02 DIAGNOSIS — L03114 Cellulitis of left upper limb: Secondary | ICD-10-CM | POA: Diagnosis not present

## 2024-02-02 DIAGNOSIS — Z9012 Acquired absence of left breast and nipple: Secondary | ICD-10-CM | POA: Insufficient documentation

## 2024-02-02 DIAGNOSIS — K8020 Calculus of gallbladder without cholecystitis without obstruction: Secondary | ICD-10-CM | POA: Diagnosis not present

## 2024-02-02 DIAGNOSIS — R531 Weakness: Secondary | ICD-10-CM | POA: Insufficient documentation

## 2024-02-02 DIAGNOSIS — I82A12 Acute embolism and thrombosis of left axillary vein: Secondary | ICD-10-CM | POA: Insufficient documentation

## 2024-02-02 DIAGNOSIS — Z87891 Personal history of nicotine dependence: Secondary | ICD-10-CM | POA: Diagnosis not present

## 2024-02-02 DIAGNOSIS — I890 Lymphedema, not elsewhere classified: Secondary | ICD-10-CM | POA: Diagnosis not present

## 2024-02-02 DIAGNOSIS — M21371 Foot drop, right foot: Secondary | ICD-10-CM | POA: Diagnosis not present

## 2024-02-02 DIAGNOSIS — Z1732 Human epidermal growth factor receptor 2 negative status: Secondary | ICD-10-CM | POA: Insufficient documentation

## 2024-02-02 DIAGNOSIS — Z8616 Personal history of COVID-19: Secondary | ICD-10-CM | POA: Insufficient documentation

## 2024-02-02 DIAGNOSIS — C50919 Malignant neoplasm of unspecified site of unspecified female breast: Secondary | ICD-10-CM | POA: Diagnosis not present

## 2024-02-02 DIAGNOSIS — Z79899 Other long term (current) drug therapy: Secondary | ICD-10-CM | POA: Diagnosis not present

## 2024-02-02 DIAGNOSIS — Z9181 History of falling: Secondary | ICD-10-CM | POA: Diagnosis not present

## 2024-02-02 DIAGNOSIS — R059 Cough, unspecified: Secondary | ICD-10-CM | POA: Insufficient documentation

## 2024-02-02 DIAGNOSIS — R530 Neoplastic (malignant) related fatigue: Secondary | ICD-10-CM | POA: Insufficient documentation

## 2024-02-02 DIAGNOSIS — I82B12 Acute embolism and thrombosis of left subclavian vein: Secondary | ICD-10-CM | POA: Insufficient documentation

## 2024-02-02 DIAGNOSIS — Z880 Allergy status to penicillin: Secondary | ICD-10-CM | POA: Insufficient documentation

## 2024-02-02 DIAGNOSIS — S9032XA Contusion of left foot, initial encounter: Secondary | ICD-10-CM | POA: Insufficient documentation

## 2024-02-02 DIAGNOSIS — I119 Hypertensive heart disease without heart failure: Secondary | ICD-10-CM | POA: Diagnosis not present

## 2024-02-02 DIAGNOSIS — Z1721 Progesterone receptor positive status: Secondary | ICD-10-CM | POA: Insufficient documentation

## 2024-02-02 DIAGNOSIS — R1310 Dysphagia, unspecified: Secondary | ICD-10-CM | POA: Insufficient documentation

## 2024-02-02 LAB — BASIC METABOLIC PANEL - CANCER CENTER ONLY
Anion gap: 9 (ref 5–15)
BUN: 18 mg/dL (ref 8–23)
CO2: 26 mmol/L (ref 22–32)
Calcium: 9.1 mg/dL (ref 8.9–10.3)
Chloride: 100 mmol/L (ref 98–111)
Creatinine: 0.78 mg/dL (ref 0.44–1.00)
GFR, Estimated: >60 mL/min (ref >60)
Glucose, Bld: 87 mg/dL (ref 70–99)
Potassium: 4.4 mmol/L (ref 3.5–5.1)
Sodium: 135 mmol/L (ref 135–145)

## 2024-02-02 NOTE — Progress Notes (Signed)
 Radiation Oncology Follow up Note  Name: Dana Snow   Date:   02/02/2024 MRN:  [id number] DOB: Aug 19, 1945    This 78 y.o. 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  She is seen today in routine follow-up.  From a chest standpoint she is doing well specifically denies hemoptysis.  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  She was on letrozole  andRibociclib which is currently on hold.  She is under the care of symptom management for the DVT.  COMPLICATIONS OF TREATMENT: none  FOLLOW UP COMPLIANCE: keeps appointments   PHYSICAL EXAM:  BP (!) 150/82   Pulse 84   Temp 97.6 F (36.4 C)   Resp 16   Wt 124 lb (56.2 kg)   BMI 22.68 kg/m  Patient has marked lymphedema of her left upper extremity.  She does have collateral vessels on her anterior chest.  Lungs are clear to AMP cardiac examination shows regular rate and rhythm.  Abdomen is benign.  No other peripheral edema is noted.  Well-developed well-nourished patient in NAD. HEENT reveals PERLA, EOMI, discs not visualized.  Oral cavity is clear. No oral mucosal lesions are identified. Neck is clear without evidence of cervical or supraclavicular adenopathy. Lungs are clear to A&P. Cardiac examination is essentially unremarkable with regular rate and rhythm without murmur rub or thrill. Abdomen is benign with no organomegaly or masses noted. Motor sensory and DTR levels are equal and symmetric in the upper and lower extremities. Cranial nerves II through XII are  grossly intact. Proprioception is intact. No peripheral adenopathy or edema is identified. No motor or sensory levels are noted. Crude visual fields are within normal range.  RADIOLOGY RESULTS: CT scan ordered in 1 month of her chest.  PLAN: Present time from a clinical standpoint patient's SVC symptoms seem to improved.  She is being treated by symptom management for left upper extremity DVT.  This seems to be responding.  I have asked to see her back in 1 month with a CT scan of her chest prior to that visit.  She will see Dr. KATHEE on Monday he may change the timing of her CT scans.  She continues close follow-up by symptom management.  Patient is to call with any concerns.  I would like to take this opportunity to thank you for allowing me to participate in the care of your patient.SABRA Marcey Penton, MD

## 2024-02-02 NOTE — Progress Notes (Signed)
 Symptom Management Clinic [facility] [phone] [phone]  Patient Care Team: Marikay Eva POUR, PA as PCP - General (Physician Assistant) Rennie Cindy SAUNDERS, MD as Consulting Physician (Oncology)   NAME OF PATIENT: Dana Snow  [id number]  [birth year]   DATE OF VISIT: 02/02/24  REASON FOR CONSULT: Velencia Lenart is a 78 y.o. 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  INTERVAL HISTORY: Patient recently hospitalized with hyperkalemia from diarrhea.  Also had generalized weakness.  Patient on treatment with dose reduced Ribociclib  and letrozole .  Was seen recently in [facility] with left upper arm extremity edema, redness, and warmth.  Was initially treated on doxycycline  100 mg twice daily x 10 days for cellulitis and refused dopplers.   Returned to [facility] on 01/27/2024 with improvement in redness and warmth of upper extremity.  Patient agreed to Dopplers and was found to have left upper extremity DVT.  She was started on Eliquis.  Patient returns to [facility] today for follow-up.  She feels like she is slowly improving.  Left arm and hand remain edematous but no pain or redness.  Patient denies any bleeding.  Has moderate fatigue.  No shortness of breath or chest pain.  Denies any neurologic complaints. Denies recent fevers or chills. Denies any easy bleeding or bruising. Reports good appetite and denies weight loss. Denies chest pain. Denies any nausea, vomiting, constipation, or diarrhea. Denies urinary complaints. Patient offers no further specific complaints today.   PAST MEDICAL HISTORY: Past Medical History:  Diagnosis Date   Breast cancer (HCC) 2015   left lumpectomy 04/05/2014 and reexcision 04/27/2014 - radiation treatment   History of COVID-19    Hypertension    Personal history of radiation therapy 2016   Left breast    PAST  SURGICAL HISTORY:  Past Surgical History:  Procedure Laterality Date   ABDOMINAL HYSTERECTOMY     BREAST BIOPSY Left 03/15/2014   8:00 us  bx, SCLEROSING ADENOSIS AND COLUMNAR CELL CHANGE    BREAST BIOPSY Left 02/27/2014   invasive lobular carcinoma LIQ   BREAST LUMPECTOMY Left 04/03/2014   [facility], DCIS, ALH, negative LN   BREAST LUMPECTOMY Left 04/27/2014   re-excision for clear margins   COMBINED HYSTEROSCOPY DIAGNOSTIC / D&C     PARTIAL MASTECTOMY WITH AXILLARY SENTINEL LYMPH NODE BIOPSY Left     HEMATOLOGY/ONCOLOGY HISTORY:  Oncology History Overview Note  #DEC 2015- LEFT  BREAST CANCER STAGE IIA [Mixed Ductal +Lobular Ca; T=2 (3.8CM); psN=0; Dr. Smith]]  ER/PR- >90%; Her 2 Nue- NEG; Lumpec & SLNBx Oncotype-RS=19 No Chemo; s/p RT [Dr.Crystal] April 2016- Arimidex; Stopped Oct 2016; START OCT 2016- Femara ; Sep 19th stopped sec to [place]; sep 19th 2019- START AROMASIN ; stop summer 2021.  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 PET scan- JULY 2025- Confluent abnormal soft tissue along the upper mediastinum. More towards the right side with involvement of the right lung hilum and separate tissue or nodes thoracic inlet on the right side adjacent to the right clavicle corresponding to the finding by prior CT scan. here are 4 hypermetabolic small bony lesions identified including thoracic spine at T1 and T2, the right acetabulum and left scapula worrisome for bony metastatic disease. A thoracic spine MRI may be of some benefit to further characterize the spine lesion. Two small nonspecific areas  of asymmetric uptake along skeletal muscle of the left gluteus medius muscle and the left pectoralis muscle.   NGS-pending.   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   # BMD- Osteopenia [may  2016]; AUG 2018- T score= -1.1 [improved] ------------------------------------------------------------   DIAGNOSIS: BREAST CANCER    CURRENT/MOST RECENT THERAPY: Surveillance.    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  MEDICATIONS:  Current Outpatient Medications  Medication Sig Dispense Refill   APIXABAN (ELIQUIS) VTE STARTER PACK (10MG  AND 5MG ) Take as directed on package: start with two-5mg  tablets twice daily for 7 days. On day 8, switch to one-5mg  tablet twice daily. 74 each 0   Calcium-Vitamin D  600-200 MG-UNIT per tablet Take 1 tablet by mouth daily.     dexamethasone  (DECADRON ) 2 MG tablet Take 1 pill twice a day.  Take the second pill early in the evening.  Take with food. (Patient not taking: Reported on 01/21/2024) 14 tablet 0   doxycycline  (VIBRA -TABS) 100 MG tablet Take 1 tablet (100 mg total) by mouth 2 (two) times daily. 8 tablet 0   feeding supplement (ENSURE PLUS HIGH PROTEIN) LIQD Take 237 mLs by mouth 2 (two) times daily between meals. 20000 mL 1   letrozole  (FEMARA ) 2.5 MG tablet Take 1 tablet (2.5 mg total) by mouth daily. 90 tablet 1   magic mouthwash (nystatin, hydrocortisone, diphenhydrAMINE, lidocaine ) suspension 5 mLs 4 (four) times daily as needed for mouth pain.     metoprolol  tartrate (LOPRESSOR ) 25 MG tablet Take 25 mg by mouth 2 (two) times daily.     ondansetron  (ZOFRAN ) 8 MG tablet One pill every 8 hours as needed for nausea/vomitting. (Patient not taking: Reported on 02/02/2024) 40 tablet 1   polyethylene glycol powder (MIRALAX ) 17 GM/SCOOP powder Take 17 g by mouth daily as needed for mild  constipation or moderate constipation. Dissolve 1 capful (17g) in 4-8 ounces of liquid and take by mouth daily. (Patient not taking: Reported on 02/02/2024)     ribociclib  succ (KISQALI  200MG  DAILY DOSE) 200 MG Therapy Pack Take 1 tablet daily for 21 days on, 7 days off. Repeat every 28 days. (Patient not taking: Reported on 02/02/2024) 21 tablet 0   [Paused] torsemide  (DEMADEX ) 20 MG tablet Take 20 mg by mouth in the morning, at noon, in the evening, and at bedtime. (Patient not taking: Reported on 02/02/2024)     traMADol (ULTRAM) 50 MG tablet Take 1 tablet (50 mg total) by mouth every 8 (eight) hours as needed (mild pain). 30 tablet 0   vitamin C (ASCORBIC ACID) 500 MG tablet Take 500 mg by mouth daily. (Patient not taking: Reported on 02/02/2024)     No current facility-administered medications for this visit.    VITAL SIGNS: There were no vitals taken for this visit. There were no vitals filed for this visit.    Estimated body mass index is 22.68 kg/m as calculated from the following:   Height as of 01/27/24: 5' 2 (1.575 m).   Weight as of an earlier encounter on 02/02/24: 124 lb (56.2 kg).  LABS: CBC:  Component Value Date/Time   WBC 8.1 01/20/2024 1047   WBC 2.9 (L) 01/04/2024 0319   HGB 11.5 (L) 01/20/2024 1047   HGB 14.8 07/17/2014 1005   HCT 35.8 (L) 01/20/2024 1047   HCT 44.1 07/17/2014 1005   PLT 273 01/20/2024 1047   PLT 227 07/17/2014 1005   MCV 103.2 (H) 01/20/2024 1047   MCV 92 07/17/2014 1005   NEUTROABS 5.0 01/20/2024 1047   NEUTROABS 2.8 07/17/2014 1005   LYMPHSABS 1.7 01/20/2024 1047   LYMPHSABS 1.3 07/17/2014 1005   MONOABS 1.3 (H) 01/20/2024 1047   MONOABS 0.5 07/17/2014 1005   EOSABS 0.0 01/20/2024 1047   EOSABS 0.3 07/17/2014 1005   BASOSABS 0.1 01/20/2024 1047   BASOSABS 0.1 07/17/2014 1005   Comprehensive Metabolic Panel:    Component Value Date/Time   NA 135 02/02/2024 1338   K 4.4 02/02/2024 1338   K 3.9 03/29/2014 1402   CL 100 02/02/2024  1338   CO2 26 02/02/2024 1338   BUN 18 02/02/2024 1338   CREATININE 0.78 02/02/2024 1338   CREATININE 0.74 07/17/2014 1005   GLUCOSE 87 02/02/2024 1338   CALCIUM 9.1 02/02/2024 1338   AST 56 (H) 01/20/2024 1046   ALT 115 (H) 01/20/2024 1046   ALT 16 07/17/2014 1005   ALKPHOS 63 01/20/2024 1046   ALKPHOS 70 07/17/2014 1005   BILITOT 1.1 01/20/2024 1046   PROT 6.5 01/20/2024 1046   PROT 7.4 07/17/2014 1005   ALBUMIN 3.5 01/20/2024 1046   ALBUMIN 4.4 07/17/2014 1005    RADIOGRAPHIC STUDIES: US  Venous Img Upper Uni Left Result Date: 01/27/2024 CLINICAL DATA:  Left upper extremity edema.  History of cellulitis. EXAM: Left UPPER EXTREMITY VENOUS DOPPLER ULTRASOUND TECHNIQUE: Gray-scale sonography with graded compression, as well as color Doppler and duplex ultrasound were performed to evaluate the upper extremity deep venous system from the level of the subclavian vein and including the jugular, axillary, basilic, radial, ulnar and upper cephalic vein. Spectral Doppler was utilized to evaluate flow at rest and with distal augmentation maneuvers. COMPARISON:  None Available. FINDINGS: Contralateral Subclavian Vein: Respiratory phasicity is normal and symmetric with the symptomatic side. No evidence of thrombus. Normal compressibility. Internal Jugular Vein: No evidence of thrombus. Normal compressibility, respiratory phasicity and response to augmentation. Subclavian Vein: There is nonocclusive thrombus in the left subclavian vein. Axillary Vein: There is nonocclusive thrombus in the left axillary vein with noncompressibility of the vessel. Cephalic Vein: No evidence of thrombus. Normal compressibility, respiratory phasicity and response to augmentation. Basilic Vein: Occlusive thrombus in the left basilic vein with noncompressibility of the vessel. Brachial Veins: There is occlusive thrombus in the proximal left brachial vein. Radial Veins: No evidence of thrombus. Normal compressibility, respiratory  phasicity and response to augmentation. Ulnar Veins: No evidence of thrombus. Normal compressibility, respiratory phasicity and response to augmentation. Venous Reflux:  None visualized. Other Findings: There is diffuse soft tissue edema in the dorsal right hand. IMPRESSION: 1. Positive for DVT in the left upper extremity as above. 2. Extensive soft tissue edema of the dorsum of the right hand. These results will be called to the ordering clinician or representative by the Radiologist Assistant, and communication documented in the PACS or [employer]. Electronically Signed   By: Vanetta Chou M.D.   On: 01/27/2024 17:15   MR 3D Recon At Scanner Result Date: 01/17/2024 CLINICAL DATA:  Elevated LFTs, no abdominal pain EXAM: MRI ABDOMEN WITHOUT CONTRAST  (INCLUDING MRCP) TECHNIQUE: Multiplanar multisequence MR imaging of the abdomen  was performed. Heavily T2-weighted images of the biliary and pancreatic ducts were obtained, and three-dimensional MRCP images were rendered by post processing. COMPARISON:  CT abdomen pelvis, 01/03/2024 FINDINGS: Lower chest: No acute abnormality.  Cardiomegaly. Hepatobiliary: No solid liver abnormality is seen. Gallbladder is packed with numerous tiny gallstones, not radiopaque on prior CT. No gallbladder wall thickening, or biliary dilatation. Pancreas: Unremarkable. No pancreatic ductal dilatation or surrounding inflammatory changes. Spleen: Normal in size without significant abnormality. Adrenals/Urinary Tract: Adrenal glands are unremarkable. Kidneys are normal, without renal calculi, solid lesion, or hydronephrosis. Bladder is unremarkable. Stomach/Bowel: Stomach is within normal limits. Appendix appears normal. No evidence of bowel wall thickening, distention, or inflammatory changes. Vascular/Lymphatic: No significant vascular findings are present. No enlarged abdominal or pelvic lymph nodes. Reproductive: No mass or other significant abnormality. Other: No abdominal  wall hernia or abnormality. No ascites. Musculoskeletal: No acute or significant osseous findings. IMPRESSION: 1. Gallbladder is packed with numerous tiny gallstones, not radiopaque on prior CT. No gallbladder wall thickening, choledocholithiasis, or biliary dilatation. 2. No acute noncontrast MR findings in the abdomen. 3. Cardiomegaly. Electronically Signed   By: Marolyn JONETTA Jaksch M.D.   On: 01/17/2024 08:30   MR ABDOMEN MRCP WO CONTRAST Result Date: 01/03/2024 CLINICAL DATA:  Elevated LFTs, no abdominal pain EXAM: MRI ABDOMEN WITHOUT CONTRAST  (INCLUDING MRCP) TECHNIQUE: Multiplanar multisequence MR imaging of the abdomen was performed. Heavily T2-weighted images of the biliary and pancreatic ducts were obtained, and three-dimensional MRCP images were rendered by post processing. COMPARISON:  CT abdomen pelvis, 01/03/2024 FINDINGS: Lower chest: No acute abnormality.  Cardiomegaly. Hepatobiliary: No solid liver abnormality is seen. Gallbladder is packed with numerous tiny gallstones, not radiopaque on prior CT. No gallbladder wall thickening, or biliary dilatation. Pancreas: Unremarkable. No pancreatic ductal dilatation or surrounding inflammatory changes. Spleen: Normal in size without significant abnormality. Adrenals/Urinary Tract: Adrenal glands are unremarkable. Kidneys are normal, without renal calculi, solid lesion, or hydronephrosis. Bladder is unremarkable. Stomach/Bowel: Stomach is within normal limits. Appendix appears normal. No evidence of bowel wall thickening, distention, or inflammatory changes. Vascular/Lymphatic: No significant vascular findings are present. No enlarged abdominal or pelvic lymph nodes. Reproductive: No mass or other significant abnormality. Other: No abdominal wall hernia or abnormality. No ascites. Musculoskeletal: No acute or significant osseous findings. IMPRESSION: 1. Gallbladder is packed with numerous tiny gallstones, not radiopaque on prior CT. No gallbladder wall  thickening, choledocholithiasis, or biliary dilatation. 2. No acute noncontrast MR findings in the abdomen. 3. Cardiomegaly. Electronically Signed   By: Marolyn JONETTA Jaksch M.D.   On: 01/03/2024 22:32    PERFORMANCE STATUS (ECOG) : 2 - Symptomatic, <50% confined to bed  Review of Systems Unless otherwise noted, a complete review of systems is negative.  Physical Exam General: NAD Cardiovascular: regular rate and rhythm Pulmonary: clear anterior/posterior fields Abdomen: soft, nontender, + bowel sounds GU: no suprapubic tenderness Extremities: BUE/BLE edema, no joint deformities Skin: no rashes Neurological: Weakness but otherwise nonfocal  IMPRESSION/PLAN: Stage IV breast cancer - On treatment with Ribociclib  plus letrozole .  Continue holding Ribociclib  until MD follow-up next week  Left upper extremity DVT-on Eliquis.  Extremity remains edematous but no redness or warmth.  Recommend conservative measures for management of edema such as elevating extremity.  MD follow-up next week  Patient expressed understanding and was in agreement with this plan. She also understands that She can call clinic at any time with any questions, concerns, or complaints.   Thank you for allowing me to participate in the care of this  very pleasant patient.   Time Total: 15 minutes  Visit consisted of counseling and education dealing with the complex and emotionally intense issues of symptom management in the setting of serious illness.Greater than 50%  of this time was spent counseling and coordinating care related to the above assessment and plan.  Signed by: Fonda Mower, PhD, NP-C

## 2024-02-04 ENCOUNTER — Encounter: Payer: Self-pay | Admitting: Radiation Oncology

## 2024-02-07 ENCOUNTER — Inpatient Hospital Stay

## 2024-02-07 ENCOUNTER — Encounter: Payer: Self-pay | Admitting: Internal Medicine

## 2024-02-07 ENCOUNTER — Other Ambulatory Visit: Payer: Self-pay

## 2024-02-07 ENCOUNTER — Inpatient Hospital Stay: Admitting: Internal Medicine

## 2024-02-07 ENCOUNTER — Other Ambulatory Visit: Payer: Self-pay | Admitting: Nurse Practitioner

## 2024-02-07 DIAGNOSIS — C50312 Malignant neoplasm of lower-inner quadrant of left female breast: Secondary | ICD-10-CM | POA: Diagnosis not present

## 2024-02-07 DIAGNOSIS — Z170 Estrogen receptor positive status [ER+]: Secondary | ICD-10-CM | POA: Diagnosis not present

## 2024-02-07 LAB — CMP (CANCER CENTER ONLY)
ALT: 29 U/L (ref 0–44)
AST: 32 U/L (ref 15–41)
Albumin: 3.1 g/dL — ABNORMAL LOW (ref 3.5–5.0)
Alkaline Phosphatase: 60 U/L (ref 38–126)
Anion gap: 9 (ref 5–15)
BUN: 15 mg/dL (ref 8–23)
CO2: 24 mmol/L (ref 22–32)
Calcium: 8.9 mg/dL (ref 8.9–10.3)
Chloride: 101 mmol/L (ref 98–111)
Creatinine: 0.8 mg/dL (ref 0.44–1.00)
GFR, Estimated: >60 mL/min (ref >60)
Glucose, Bld: 105 mg/dL — ABNORMAL HIGH (ref 70–99)
Potassium: 4.3 mmol/L (ref 3.5–5.1)
Sodium: 134 mmol/L — ABNORMAL LOW (ref 135–145)
Total Bilirubin: 1.1 mg/dL (ref 0.0–1.2)
Total Protein: 6 g/dL — ABNORMAL LOW (ref 6.5–8.1)

## 2024-02-07 LAB — CBC WITH DIFFERENTIAL (CANCER CENTER ONLY)
Abs Immature Granulocytes: 0.02 K/uL (ref 0.00–0.07)
Basophils Absolute: 0.1 K/uL (ref 0.0–0.1)
Basophils Relative: 1 %
Eosinophils Absolute: 0.1 K/uL (ref 0.0–0.5)
Eosinophils Relative: 1 %
HCT: 42 % (ref 36.0–46.0)
Hemoglobin: 13.4 g/dL (ref 12.0–15.0)
Immature Granulocytes: 0 %
Lymphocytes Relative: 19 %
Lymphs Abs: 1.3 K/uL (ref 0.7–4.0)
MCH: 33.1 pg (ref 26.0–34.0)
MCHC: 31.9 g/dL (ref 30.0–36.0)
MCV: 103.7 fL — ABNORMAL HIGH (ref 80.0–100.0)
Monocytes Absolute: 0.6 K/uL (ref 0.1–1.0)
Monocytes Relative: 9 %
Neutro Abs: 5 K/uL (ref 1.7–7.7)
Neutrophils Relative %: 70 %
Platelet Count: 212 K/uL (ref 150–400)
RBC: 4.05 MIL/uL (ref 3.87–5.11)
RDW: 17.7 % — ABNORMAL HIGH (ref 11.5–15.5)
WBC Count: 7.1 K/uL (ref 4.0–10.5)
nRBC: 0 % (ref 0.0–0.2)

## 2024-02-07 LAB — MAGNESIUM: Magnesium: 2.3 mg/dL (ref 1.7–2.4)

## 2024-02-07 NOTE — Progress Notes (Signed)
 Patient states she does have some concerns and has a list of them, that she will share with MD.

## 2024-02-07 NOTE — Assessment & Plan Note (Addendum)
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 PET scan- JULY 2025- Confluent abnormal soft tissue along the upper mediastinum. More towards the right side with involvement of the right lung hilum and separate tissue or nodes thoracic inlet on the right side adjacent to the right clavicle corresponding to the finding by prior CT scan. here are 4 hypermetabolic small bony lesions identified including thoracic spine at T1 and T2, the right acetabulum and left scapula worrisome for bony metastatic disease. A thoracic spine MRI may be of some benefit to further characterize the spine lesion. Two small nonspecific areas of asymmetric uptake along skeletal muscle of the left gluteus medius muscle and the left pectoralis muscle.  Awaiting on NGS. SEP 2025-  Currently on on  letrozole  + Ribociclib .     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 Liver function normalized. Shortness of breath and fluid retention due to vascular involvement. Physical therapy beneficial. - Resume Kisqali  at one pill daily-however if poor tolerance consider changing to alternative like Ibrance.  However if tolerating well would increase the dose to 2 pills again - Continue letrozole . - Scheduled CT scan in December. - Continue physical therapy.  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 No bleeding signs, stable hemoglobin. Occasional tarry stools likely dietary. - Continue Eliquis twice daily. - Monitor for bleeding signs. - Consider future ultrasound for clot status.  Foot drop, right greater than left-no major clinical concerns for CNS metastasis.  October 2025 CT scan negative for any mets. Likely neuropathy or weakness from hospitalizations and treatment.  High fall risk due to foot drop and anticoagulation. - Continue physical therapy. - Use walker to prevent falls. - Discuss braces with physical therapist if no improvement.      # SVC syndrome-/ anisocoria-  - s/p RT-  9/29.Dr. Marea.--Noted to have hoarseness of voice and also chest congestion-recommend restarting torsemide  half a pill a day [10 mg a day] monitor closely for blood pressure changes and also dehydration.  # OCT 30th, 2025- Positive for DVT in the left upper extremity- on Eliquis 5 mg BID.  # Black stools- ? Etiology-likely dietary Long discussion regarding possibility for need for for EGD if does not improve.    # OSTEOPENIA- [BMD= T score- 1.4]; continue ca+ vit D; exercise; monitor closely on therapy   # IV accesss: difficult with larger needles -patient reluctant with port.  However will discuss with Dr. Marea.  CT 12/05- with Rad # DISPOSITION: # follow up in 4 weeks- MD; labs- cbc/cmp;Ca 27-29- mag Dr.B  # 40 minutes face-to-face with the patient discussing the above plan of care; more than 50% of time spent on prognosis/ natural history; counseling and coordination.

## 2024-02-07 NOTE — Progress Notes (Signed)
 [facility] CONSULT NOTE  Patient Care Team: Dana Snow, Dana Snow as PCP - General (Physician Assistant) Dana Cindy SAUNDERS, MD as Consulting Physician (Oncology)  CHIEF COMPLAINTS/PURPOSE OF CONSULTATION: recurrent breast cancer/ SVC syndrome  Oncology History Overview Note  #DEC 2015- LEFT  BREAST CANCER STAGE IIA [Mixed Ductal +Lobular Ca; T=2 (3.8CM); psN=0; Dr. Smith]]  ER/PR- >90%; Her 2 Nue- NEG; Lumpec & SLNBx Oncotype-RS=19 No Chemo; s/p RT [Dr.Crystal] April 2016- Arimidex; Stopped Oct 2016; START OCT 2016- Femara ; Sep 19th stopped sec to [facility]; sep 19th 2019- START AROMASIN ; stop summer 2021.  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 PET scan- JULY 2025- Confluent abnormal soft tissue along the upper mediastinum. More towards the right side with involvement of the right lung hilum and separate tissue or nodes thoracic inlet on the right side adjacent to the right clavicle corresponding to the finding by prior CT scan. here are 4 hypermetabolic small bony lesions identified including thoracic spine at T1 and T2, the right acetabulum and left scapula worrisome for bony metastatic disease. A thoracic spine MRI may be of some benefit to further characterize the spine lesion. Two small nonspecific areas of asymmetric uptake along skeletal muscle of the left gluteus medius muscle and the left pectoralis muscle.   NGS-pending.   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   # BMD- Osteopenia [may 2016]; AUG 2018- T score= -1.1 [improved] ------------------------------------------------------------   DIAGNOSIS: BREAST CANCER    CURRENT/MOST RECENT THERAPY: Surveillance.    Carcinoma of lower-inner quadrant of left breast in female, estrogen receptor positive  (HCC)  04/12/2020 Cancer Staging   Staging form: Breast, AJCC 8th Edition - Clinical: Stage IB (cT2, cN0, cM0, G2, ER+, PR+, HER2-) - Signed by Dana Gilbo R, MD on 04/12/2020     HISTORY OF PRESENTING ILLNESS: Patient ambulating-independently. Accompanied by oldest daughter.   Dana Snow 78 y.o.  female pleasant patient with recurrent breast cancer-stage IV-ER pos;her 2 NEG- with SVC syndrome.   Discussed the use of AI scribe software for clinical note transcription with the patient, who gave verbal consent to proceed.  History of Present Illness   Dana Snow is a 78 year old female with recurrent breast cancer and SVC syndrome who presents for follow-up.  She has a history of recurrent invasive lobular breast cancer and is currently managing SVC syndrome. She is on Eliquis for a blood clot left upper extremity, taking one pill in the morning and one at night. She inquired about the duration of her Eliquis treatment.  She was recently hospitalized due to elevated liver enzymes and cellulitis, which led to a temporary discontinuation of Kisqali . She has been off Kisqali  for about a month and has not started the new half dosage sent to her. She continues to take Letrozole .  She experiences shortness of breath, particularly when walking across a room, and fluid retention causing a sensation of a 'froggy throat' and difficulty swallowing and breathing. She sleeps well from 9 PM to 1 AM but uses cough medicine to manage coughing fits. She is currently off Lasix and Torsemide  but brought her medications to the visit for review.  She has noticed tarry stools once or twice in the past week but denies any consistent occurrence. She is taking Miralax  twice a  week to regulate bowel movements and reports no abdominal pain, nausea, or vomiting.  Her vision has worsened, becoming very blurry, and she plans to move up her eye appointment. She is unsure if cataracts are the cause.  She  experiences foot drop, particularly in the right foot, which has been ongoing for several weeks. Physical therapy has been helpful, but her gait remains slightly off. Her toes stay cold even with wool socks, and she has been wearing booties for warmth. She reports bruising on her left foot from a previous incident but states it is fine now.      Review of Systems  Constitutional:  Positive for malaise/fatigue and weight loss. Negative for chills, diaphoresis and fever.  HENT:  Negative for nosebleeds and sore throat.   Eyes:  Negative for double vision.  Respiratory:  Positive for cough and shortness of breath. Negative for hemoptysis, sputum production and wheezing.   Cardiovascular:  Negative for chest pain, palpitations, orthopnea and leg swelling.  Gastrointestinal:  Negative for abdominal pain, blood in stool, constipation, diarrhea, heartburn, melena, nausea and vomiting.  Genitourinary:  Negative for dysuria, frequency and urgency.  Musculoskeletal:  Negative for back pain and joint pain.  Skin: Negative.  Negative for itching and rash.  Neurological:  Positive for weakness. Negative for dizziness, tingling, focal weakness and headaches.  Endo/Heme/Allergies:  Does not bruise/bleed easily.  Psychiatric/Behavioral:  Negative for depression. The patient is not nervous/anxious and does not have insomnia.     MEDICAL HISTORY:  Past Medical History:  Diagnosis Date   Breast cancer (HCC) 2015   left lumpectomy 04/05/2014 and reexcision 04/27/2014 - radiation treatment   History of COVID-19    Hypertension    Personal history of radiation therapy 2016   Left breast    SURGICAL HISTORY: Past Surgical History:  Procedure Laterality Date   ABDOMINAL HYSTERECTOMY     BREAST BIOPSY Left 03/15/2014   8:00 us  bx, SCLEROSING ADENOSIS AND COLUMNAR CELL CHANGE    BREAST BIOPSY Left 02/27/2014   invasive lobular carcinoma LIQ   BREAST LUMPECTOMY Left 04/03/2014   [facility], DCIS, ALH, negative LN    BREAST LUMPECTOMY Left 04/27/2014   re-excision for clear margins   COMBINED HYSTEROSCOPY DIAGNOSTIC / D&C     PARTIAL MASTECTOMY WITH AXILLARY SENTINEL LYMPH NODE BIOPSY Left     SOCIAL HISTORY: Social History   Socioeconomic History   Marital status: Married    Spouse name: Not on file   Number of children: Not on file   Years of education: Not on file   Highest education level: Not on file  Occupational History   Not on file  Tobacco Use   Smoking status: Former    Current packs/day: 1.50    Types: Cigarettes   Smokeless tobacco: Never  Vaping Use   Vaping status: Never Used  Substance and Sexual Activity   Alcohol use: Yes    Alcohol/week: 2.0 standard drinks of alcohol    Types: 2 Glasses of wine per week   Drug use: Never   Sexual activity: Never  Other Topics Concern   Not on file  Social History Narrative   Not on file   Social Drivers of Health   Financial Resource Strain: Low Risk  (03/11/2023)   Received from [facility]   Overall Financial Resource Strain (CARDIA)    Difficulty of Paying Living Expenses: Not hard at all  Food Insecurity: Unknown (01/27/2024)   Hunger Vital Sign  Worried About Programme Researcher, Broadcasting/film/video in the Last Year: Never true    [employer] of Food in the Last Year: Not on file  Transportation Needs: Unknown (01/27/2024)   PRAPARE - Administrator, Civil Service (Medical): No    Lack of Transportation (Non-Medical): Not on file  Physical Activity: Not on file  Stress: Not on file  Social Connections: Patient Declined (01/03/2024)   Social Connection and Isolation Panel    Frequency of Communication with Friends and Family: Patient declined    Frequency of Social Gatherings with Friends and Family: Patient declined    Attends Religious Services: Patient declined    Database Administrator or Organizations: Patient declined    Attends Banker Meetings: Patient declined    Marital Status:  Patient declined  Intimate Partner Violence: Not At Risk (01/03/2024)   Humiliation, Afraid, Rape, and Kick questionnaire    Fear of Current or Ex-Partner: No    Emotionally Abused: No    Physically Abused: No    Sexually Abused: No    FAMILY HISTORY: Family History  Problem Relation Age of Onset   Breast cancer Neg Hx     ALLERGIES:  is allergic to percocet [oxycodone-acetaminophen ], codeine, codone [hydrocodone], oxycodone hcl, and penicillins.  MEDICATIONS:  Current Outpatient Medications  Medication Sig Dispense Refill   APIXABAN (ELIQUIS) VTE STARTER PACK (10MG  AND 5MG ) Take as directed on package: start with two-5mg  tablets twice daily for 7 days. On day 8, switch to one-5mg  tablet twice daily. 74 each 0   Calcium-Vitamin D  600-200 MG-UNIT per tablet Take 1 tablet by mouth daily.     dexamethasone  (DECADRON ) 2 MG tablet Take 1 pill twice a day.  Take the second pill early in the evening.  Take with food. 14 tablet 0   doxycycline  (VIBRA -TABS) 100 MG tablet Take 1 tablet (100 mg total) by mouth 2 (two) times daily. 8 tablet 0   feeding supplement (ENSURE PLUS HIGH PROTEIN) LIQD Take 237 mLs by mouth 2 (two) times daily between meals. 20000 mL 1   letrozole  (FEMARA ) 2.5 MG tablet Take 1 tablet (2.5 mg total) by mouth daily. 90 tablet 1   metoprolol  tartrate (LOPRESSOR ) 25 MG tablet Take 25 mg by mouth 2 (two) times daily.     traMADol (ULTRAM) 50 MG tablet Take 1 tablet (50 mg total) by mouth every 8 (eight) hours as needed (mild pain). 30 tablet 0   magic mouthwash (nystatin, hydrocortisone, diphenhydrAMINE, lidocaine ) suspension 5 mLs 4 (four) times daily as needed for mouth pain. (Patient not taking: Reported on 02/07/2024)     ondansetron  (ZOFRAN ) 8 MG tablet One pill every 8 hours as needed for nausea/vomitting. (Patient not taking: Reported on 02/07/2024) 40 tablet 1   polyethylene glycol powder (MIRALAX ) 17 GM/SCOOP powder Take 17 g by mouth daily as needed for mild  constipation or moderate constipation. Dissolve 1 capful (17g) in 4-8 ounces of liquid and take by mouth daily. (Patient not taking: Reported on 02/07/2024)     ribociclib  succ (KISQALI  200MG  DAILY DOSE) 200 MG Therapy Pack Take 1 tablet daily for 21 days on, 7 days off. Repeat every 28 days. (Patient not taking: Reported on 02/07/2024) 21 tablet 0   [Paused] torsemide  (DEMADEX ) 20 MG tablet Take 20 mg by mouth in the morning, at noon, in the evening, and at bedtime. (Patient not taking: Reported on 02/07/2024)     vitamin C (ASCORBIC ACID) 500 MG tablet Take 500 mg  by mouth daily. (Patient not taking: Reported on 02/07/2024)     No current facility-administered medications for this visit.    PHYSICAL EXAMINATION:   Vitals:   02/07/24 0822  BP: 136/64  Pulse: 80  Resp: 17  Temp: 97.6 F (36.4 C)  SpO2: 97%   Filed Weights   02/07/24 0822  Weight: 124 lb (56.2 kg)    Facial puffiness noted.  Right pupil smaller- and drooping noted.   Venous engorgement on the chest noted.   Left upper extremity swollen compared to the right.  Bilateral foot drop noted right more than left.  Physical Exam Vitals and nursing note reviewed.  HENT:     Head: Normocephalic and atraumatic.     Mouth/Throat:     Pharynx: Oropharynx is clear.  Eyes:     Extraocular Movements: Extraocular movements intact.     Pupils: Pupils are equal, round, and reactive to light.  Cardiovascular:     Rate and Rhythm: Normal rate and regular rhythm.  Pulmonary:     Comments: Decreased breath sounds bilaterally.  Abdominal:     Palpations: Abdomen is soft.  Musculoskeletal:        General: Normal range of motion.     Cervical back: Normal range of motion.  Skin:    General: Skin is warm.  Neurological:     General: No focal deficit present.     Mental Status: She is alert and oriented to person, place, and time.  Psychiatric:        Behavior: Behavior normal.        Judgment: Judgment normal.      LABORATORY DATA:  I have reviewed the data as listed Lab Results  Component Value Date   WBC 7.1 02/07/2024   HGB 13.4 02/07/2024   HCT 42.0 02/07/2024   MCV 103.7 (H) 02/07/2024   PLT 212 02/07/2024   Recent Labs    01/10/24 1448 01/20/24 1046 01/27/24 1332 02/02/24 1338 02/07/24 0827  NA 139 133* 134* 135 134*  K 4.5 4.1 3.5 4.4 4.3  CL 102 100 97* 100 101  CO2 24 25 26 26 24   GLUCOSE 112* 82 131* 87 105*  BUN 16 17 28* 18 15  CREATININE 0.96 0.84 0.72 0.78 0.80  CALCIUM 9.1 9.2 9.2 9.1 8.9  GFRNONAA >60 >60 >60 >60 >60  PROT 6.3* 6.5  --   --  6.0*  ALBUMIN 3.7 3.5  --   --  3.1*  AST 77* 56*  --   --  32  ALT 255* 115*  --   --  29  ALKPHOS 58 63  --   --  60  BILITOT 0.9 1.1  --   --  1.1    RADIOGRAPHIC STUDIES: I have personally reviewed the radiological images as listed and agreed with the findings in the report. US  Venous Img Upper Uni Left Result Date: 01/27/2024 CLINICAL DATA:  Left upper extremity edema.  History of cellulitis. EXAM: Left UPPER EXTREMITY VENOUS DOPPLER ULTRASOUND TECHNIQUE: Gray-scale sonography with graded compression, as well as color Doppler and duplex ultrasound were performed to evaluate the upper extremity deep venous system from the level of the subclavian vein and including the jugular, axillary, basilic, radial, ulnar and upper cephalic vein. Spectral Doppler was utilized to evaluate flow at rest and with distal augmentation maneuvers. COMPARISON:  None Available. FINDINGS: Contralateral Subclavian Vein: Respiratory phasicity is normal and symmetric with the symptomatic side. No evidence of thrombus. Normal compressibility. Internal  Jugular Vein: No evidence of thrombus. Normal compressibility, respiratory phasicity and response to augmentation. Subclavian Vein: There is nonocclusive thrombus in the left subclavian vein. Axillary Vein: There is nonocclusive thrombus in the left axillary vein with noncompressibility of the vessel.  Cephalic Vein: No evidence of thrombus. Normal compressibility, respiratory phasicity and response to augmentation. Basilic Vein: Occlusive thrombus in the left basilic vein with noncompressibility of the vessel. Brachial Veins: There is occlusive thrombus in the proximal left brachial vein. Radial Veins: No evidence of thrombus. Normal compressibility, respiratory phasicity and response to augmentation. Ulnar Veins: No evidence of thrombus. Normal compressibility, respiratory phasicity and response to augmentation. Venous Reflux:  None visualized. Other Findings: There is diffuse soft tissue edema in the dorsal right hand. IMPRESSION: 1. Positive for DVT in the left upper extremity as above. 2. Extensive soft tissue edema of the dorsum of the right hand. These results will be called to the ordering clinician or representative by the Radiologist Assistant, and communication documented in the PACS or [employer]. Electronically Signed   By: Vanetta Chou M.D.   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 PET scan- JULY 2025- Confluent abnormal soft tissue along the upper mediastinum. More towards the right side with involvement of the right lung hilum and separate tissue or nodes thoracic inlet on the right side adjacent to the right clavicle corresponding to the finding by prior CT scan. here are 4 hypermetabolic small bony lesions identified including thoracic spine at T1 and T2, the right acetabulum and left scapula worrisome for bony metastatic disease. A thoracic spine MRI may be of some benefit to further characterize the spine lesion. Two small nonspecific areas of asymmetric uptake along skeletal muscle of the left gluteus medius muscle and the left pectoralis  muscle.  Awaiting on NGS. SEP 2025-  Currently on on  letrozole  + Ribociclib .     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 Liver function normalized. Shortness of breath and fluid retention due to vascular involvement. Physical therapy beneficial. - Resume Kisqali  at one pill daily-however if poor tolerance consider changing to alternative like Ibrance.  However if tolerating well would increase the dose to 2 pills again - Continue letrozole . - Scheduled CT scan in December. - Continue physical therapy.  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 No bleeding signs, stable hemoglobin. Occasional tarry stools likely dietary. - Continue Eliquis twice daily. - Monitor for bleeding signs. - Consider future ultrasound for clot status.  Foot drop, right greater than left-no major clinical concerns for CNS metastasis.  October 2025 CT scan negative for any mets. Likely neuropathy or weakness from hospitalizations and treatment. High fall risk due to foot drop and anticoagulation. - Continue physical therapy. - Use walker to prevent falls. - Discuss braces with physical therapist if no improvement.      # SVC syndrome-/ anisocoria-  - s/p RT-  9/29.Dr. Marea.--Noted to have hoarseness of voice and also chest congestion-recommend restarting torsemide  half a pill a day [10 mg a day] monitor closely for blood pressure changes and also dehydration.  # OCT 30th, 2025- Positive for DVT in the left upper extremity- on Eliquis 5  mg BID.  # Black stools- ? Etiology-likely dietary Long discussion regarding possibility for need for for EGD if does not improve.    # OSTEOPENIA- [BMD= T score- 1.4]; continue ca+ vit D; exercise; monitor closely on therapy   # IV accesss: difficult with larger needles -patient reluctant with port.  However will discuss with  Dr. Marea.  CT 12/05- with Rad # DISPOSITION: # follow up in 4 weeks- MD; labs- cbc/cmp;Ca 27-29- mag Dr.B  # 40 minutes face-to-face with the patient discussing the above plan of care; more than 50% of time spent on prognosis/ natural history; counseling and coordination.   Above plan of care was discussed with patient/family in detail.  My contact information was given to the patient/family.     Cindy JONELLE Joe, MD 02/07/2024 11:31 AM

## 2024-02-08 LAB — CA 27.29 (SERIAL MONITOR): CA 27.29: 70.2 U/mL — ABNORMAL HIGH (ref 0.0–38.6)

## 2024-02-08 MED ORDER — RIBOCICLIB SUCC (200 MG DOSE) 200 MG PO TBPK
200.0000 mg | ORAL_TABLET | Freq: Every day | ORAL | 0 refills | Status: DC
  Filled 2024-02-09 – 2024-02-25 (×2): qty 21, 28d supply, fill #0

## 2024-02-09 ENCOUNTER — Other Ambulatory Visit: Payer: Self-pay

## 2024-02-25 ENCOUNTER — Other Ambulatory Visit: Payer: Self-pay

## 2024-02-25 ENCOUNTER — Other Ambulatory Visit (HOSPITAL_COMMUNITY): Payer: Self-pay

## 2024-02-25 ENCOUNTER — Other Ambulatory Visit: Payer: Self-pay | Admitting: Oncology

## 2024-02-28 ENCOUNTER — Telehealth: Payer: Self-pay

## 2024-02-28 NOTE — Telephone Encounter (Signed)
 Pt would like to know if you would put in a new order for p/t with [facility] for 6 weeks for her gait and foot drop. Current P/T order runs out tomorrow.

## 2024-02-29 ENCOUNTER — Other Ambulatory Visit: Payer: Self-pay

## 2024-02-29 NOTE — Telephone Encounter (Signed)
 Looks like pt sent home from ED/Hospital with home health p/t. I found [facility] online, printed the referral form, filled it out, attached office notes, placed for Dr. B to sign and then will fax.

## 2024-02-29 NOTE — Telephone Encounter (Signed)
 Left message for pt to call office, need info on [facility].

## 2024-03-01 ENCOUNTER — Telehealth: Payer: Self-pay

## 2024-03-01 ENCOUNTER — Other Ambulatory Visit: Payer: Self-pay | Admitting: Internal Medicine

## 2024-03-01 DIAGNOSIS — C50312 Malignant neoplasm of lower-inner quadrant of left female breast: Secondary | ICD-10-CM

## 2024-03-01 NOTE — Telephone Encounter (Signed)
 Form signed and faxed, pt notified by message.

## 2024-03-01 NOTE — Telephone Encounter (Signed)
 Dr. Jaye from [facility] left message for Dr. B to call him today if possible at [phone].

## 2024-03-02 ENCOUNTER — Other Ambulatory Visit: Payer: Self-pay | Admitting: *Deleted

## 2024-03-02 DIAGNOSIS — C50312 Malignant neoplasm of lower-inner quadrant of left female breast: Secondary | ICD-10-CM

## 2024-03-02 DIAGNOSIS — H547 Unspecified visual loss: Secondary | ICD-10-CM

## 2024-03-02 DIAGNOSIS — R42 Dizziness and giddiness: Secondary | ICD-10-CM

## 2024-03-03 ENCOUNTER — Ambulatory Visit
Admission: RE | Admit: 2024-03-03 | Discharge: 2024-03-03 | Disposition: A | Source: Ambulatory Visit | Attending: Radiation Oncology | Admitting: Radiation Oncology

## 2024-03-03 ENCOUNTER — Ambulatory Visit
Admission: RE | Admit: 2024-03-03 | Discharge: 2024-03-03 | Disposition: A | Source: Ambulatory Visit | Attending: Internal Medicine

## 2024-03-03 DIAGNOSIS — H547 Unspecified visual loss: Secondary | ICD-10-CM

## 2024-03-03 DIAGNOSIS — R42 Dizziness and giddiness: Secondary | ICD-10-CM

## 2024-03-03 DIAGNOSIS — I871 Compression of vein: Secondary | ICD-10-CM

## 2024-03-03 DIAGNOSIS — C50312 Malignant neoplasm of lower-inner quadrant of left female breast: Secondary | ICD-10-CM

## 2024-03-03 MED ORDER — GADOBUTROL 1 MMOL/ML IV SOLN
6.0000 mL | Freq: Once | INTRAVENOUS | Status: AC | PRN
  Administered 2024-03-03: 6 mL via INTRAVENOUS

## 2024-03-03 MED ORDER — IOHEXOL 300 MG/ML  SOLN
75.0000 mL | Freq: Once | INTRAMUSCULAR | Status: AC | PRN
  Administered 2024-03-03: 75 mL via INTRAVENOUS

## 2024-03-07 ENCOUNTER — Other Ambulatory Visit: Payer: Self-pay

## 2024-03-07 ENCOUNTER — Inpatient Hospital Stay: Admitting: Internal Medicine

## 2024-03-07 ENCOUNTER — Encounter: Payer: Self-pay | Admitting: Internal Medicine

## 2024-03-07 ENCOUNTER — Ambulatory Visit: Payer: Self-pay | Admitting: Internal Medicine

## 2024-03-07 ENCOUNTER — Inpatient Hospital Stay: Attending: Internal Medicine

## 2024-03-07 DIAGNOSIS — R0789 Other chest pain: Secondary | ICD-10-CM | POA: Insufficient documentation

## 2024-03-07 DIAGNOSIS — M7989 Other specified soft tissue disorders: Secondary | ICD-10-CM | POA: Diagnosis not present

## 2024-03-07 DIAGNOSIS — Z885 Allergy status to narcotic agent status: Secondary | ICD-10-CM | POA: Diagnosis not present

## 2024-03-07 DIAGNOSIS — Z79811 Long term (current) use of aromatase inhibitors: Secondary | ICD-10-CM | POA: Insufficient documentation

## 2024-03-07 DIAGNOSIS — R0609 Other forms of dyspnea: Secondary | ICD-10-CM | POA: Diagnosis not present

## 2024-03-07 DIAGNOSIS — Z880 Allergy status to penicillin: Secondary | ICD-10-CM | POA: Diagnosis not present

## 2024-03-07 DIAGNOSIS — Z8616 Personal history of COVID-19: Secondary | ICD-10-CM | POA: Insufficient documentation

## 2024-03-07 DIAGNOSIS — R0602 Shortness of breath: Secondary | ICD-10-CM | POA: Insufficient documentation

## 2024-03-07 DIAGNOSIS — Z87891 Personal history of nicotine dependence: Secondary | ICD-10-CM | POA: Insufficient documentation

## 2024-03-07 DIAGNOSIS — M8580 Other specified disorders of bone density and structure, unspecified site: Secondary | ICD-10-CM | POA: Diagnosis not present

## 2024-03-07 DIAGNOSIS — Z90710 Acquired absence of both cervix and uterus: Secondary | ICD-10-CM | POA: Insufficient documentation

## 2024-03-07 DIAGNOSIS — Z9012 Acquired absence of left breast and nipple: Secondary | ICD-10-CM | POA: Diagnosis not present

## 2024-03-07 DIAGNOSIS — Z923 Personal history of irradiation: Secondary | ICD-10-CM | POA: Insufficient documentation

## 2024-03-07 DIAGNOSIS — Z79899 Other long term (current) drug therapy: Secondary | ICD-10-CM | POA: Diagnosis not present

## 2024-03-07 DIAGNOSIS — Z1721 Progesterone receptor positive status: Secondary | ICD-10-CM | POA: Insufficient documentation

## 2024-03-07 DIAGNOSIS — Z1732 Human epidermal growth factor receptor 2 negative status: Secondary | ICD-10-CM | POA: Diagnosis not present

## 2024-03-07 DIAGNOSIS — M21379 Foot drop, unspecified foot: Secondary | ICD-10-CM | POA: Insufficient documentation

## 2024-03-07 DIAGNOSIS — Z7901 Long term (current) use of anticoagulants: Secondary | ICD-10-CM | POA: Diagnosis not present

## 2024-03-07 DIAGNOSIS — C50312 Malignant neoplasm of lower-inner quadrant of left female breast: Secondary | ICD-10-CM | POA: Diagnosis present

## 2024-03-07 DIAGNOSIS — Z170 Estrogen receptor positive status [ER+]: Secondary | ICD-10-CM | POA: Insufficient documentation

## 2024-03-07 DIAGNOSIS — R5383 Other fatigue: Secondary | ICD-10-CM | POA: Diagnosis not present

## 2024-03-07 LAB — CBC WITH DIFFERENTIAL (CANCER CENTER ONLY)
Abs Immature Granulocytes: 0.01 K/uL (ref 0.00–0.07)
Basophils Absolute: 0.1 K/uL (ref 0.0–0.1)
Basophils Relative: 1 %
Eosinophils Absolute: 0 K/uL (ref 0.0–0.5)
Eosinophils Relative: 1 %
HCT: 38.6 % (ref 36.0–46.0)
Hemoglobin: 12.5 g/dL (ref 12.0–15.0)
Immature Granulocytes: 0 %
Lymphocytes Relative: 33 %
Lymphs Abs: 1.2 K/uL (ref 0.7–4.0)
MCH: 34.7 pg — ABNORMAL HIGH (ref 26.0–34.0)
MCHC: 32.4 g/dL (ref 30.0–36.0)
MCV: 107.2 fL — ABNORMAL HIGH (ref 80.0–100.0)
Monocytes Absolute: 0.5 K/uL (ref 0.1–1.0)
Monocytes Relative: 13 %
Neutro Abs: 1.9 K/uL (ref 1.7–7.7)
Neutrophils Relative %: 52 %
Platelet Count: 183 K/uL (ref 150–400)
RBC: 3.6 MIL/uL — ABNORMAL LOW (ref 3.87–5.11)
RDW: 17.5 % — ABNORMAL HIGH (ref 11.5–15.5)
WBC Count: 3.6 K/uL — ABNORMAL LOW (ref 4.0–10.5)
nRBC: 0 % (ref 0.0–0.2)

## 2024-03-07 LAB — CMP (CANCER CENTER ONLY)
ALT: 56 U/L — ABNORMAL HIGH (ref 0–44)
AST: 39 U/L (ref 15–41)
Albumin: 4.1 g/dL (ref 3.5–5.0)
Alkaline Phosphatase: 75 U/L (ref 38–126)
Anion gap: 12 (ref 5–15)
BUN: 14 mg/dL (ref 8–23)
CO2: 32 mmol/L (ref 22–32)
Calcium: 9.7 mg/dL (ref 8.9–10.3)
Chloride: 100 mmol/L (ref 98–111)
Creatinine: 1.02 mg/dL — ABNORMAL HIGH (ref 0.44–1.00)
GFR, Estimated: 56 mL/min — ABNORMAL LOW (ref >60)
Glucose, Bld: 125 mg/dL — ABNORMAL HIGH (ref 70–99)
Potassium: 3.2 mmol/L — ABNORMAL LOW (ref 3.5–5.1)
Sodium: 144 mmol/L (ref 135–145)
Total Bilirubin: 0.9 mg/dL (ref 0.0–1.2)
Total Protein: 6.9 g/dL (ref 6.5–8.1)

## 2024-03-07 LAB — MAGNESIUM: Magnesium: 2.3 mg/dL (ref 1.7–2.4)

## 2024-03-07 MED ORDER — RIBOCICLIB SUCC (200 MG DOSE) 200 MG PO TBPK
200.0000 mg | ORAL_TABLET | Freq: Every day | ORAL | 3 refills | Status: AC
  Filled 2024-03-07 – 2024-04-11 (×2): qty 21, 21d supply, fill #0
  Filled 2024-04-13: qty 21, 28d supply, fill #0
  Filled 2024-05-04: qty 21, 28d supply, fill #1

## 2024-03-07 NOTE — Progress Notes (Signed)
 Patient has concerns on a card and will discuss with MD.

## 2024-03-07 NOTE — Progress Notes (Signed)
 Please fax a copy of the brain MRI-to patient's ophthalmologist Dr. Jaye at [facility]. Thank you-GB

## 2024-03-07 NOTE — Progress Notes (Signed)
 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 Smith]]  ER/PR- >90%; Her 2 Nue- NEG; Lumpec & SLNBx Oncotype-RS=19 No Chemo; s/p RT [Dr.Crystal] April 2016- Arimidex; Stopped Oct 2016; START OCT 2016- Femara ; Sep 19th stopped sec to [place]; sep 19th 2019- START AROMASIN ; stop summer 2021.  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 PET scan- JULY 2025- Confluent abnormal soft tissue along the upper mediastinum. More towards the right side with involvement of the right lung hilum and separate tissue or nodes thoracic inlet on the right side adjacent to the right clavicle corresponding to the finding by prior CT scan. here are 4 hypermetabolic small bony lesions identified including thoracic spine at T1 and T2, the right acetabulum and left scapula worrisome for bony metastatic disease. A thoracic spine MRI may be of some benefit to further characterize the spine lesion. Two small nonspecific areas of asymmetric uptake along skeletal muscle of the left gluteus medius muscle and the left pectoralis muscle.   NGS-pending.   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   # BMD- Osteopenia [may 2016]; AUG 2018- T score= -1.1 [improved] ------------------------------------------------------------   DIAGNOSIS: BREAST CANCER    CURRENT/MOST RECENT THERAPY: Surveillance.    Carcinoma of lower-inner quadrant of left breast in female, estrogen receptor positive  (HCC)  04/12/2020 Cancer Staging   Staging form: Breast, AJCC 8th Edition - Clinical: Stage IB (cT2, cN0, cM0, G2, ER+, PR+, HER2-) - Signed by Arvetta Araque R, MD on 04/12/2020     HISTORY OF PRESENTING ILLNESS: Patient ambulating-independently. Accompanied by oldest daughter.   Dana Snow 78 y.o.  female pleasant patient with recurrent breast cancer-stage IV-ER pos;her 2 NEG- with SVC syndrome.   Discussed the use of AI scribe software for clinical note transcription with the patient, who gave verbal consent to proceed.  History of Present Illness   Dana Snow is a 78 year old female with recurrent breast cancer and SVC syndrome who presents for follow-up.  Discussed the use of AI scribe software for clinical note transcription with the patient, who gave verbal consent to proceed.  History of Present Illness   Dana Snow is a 78 year old female with metastatic ER/PR+ HER2- breast cancer who presents for oncology follow-up and review of recent imaging.  She is currently receiving Kisqali  and letrozole  for metastatic breast cancer. Recent CT imaging showed mediastinal disease, mild pleural effusion, and an area in the posterior thorax described as healing or scarred. MRI of the brain was reported as normal. She recently restarted Kisqali  after a scheduled break. She previously had elevated liver enzymes and was hospitalized related to chemotherapy.  She experiences chronic dyspnea, which has improved compared to prior visits but remains limiting, particularly with exertion such as walking across the room, climbing stairs, or talking. She is able to take deep breaths and uses breathing techniques, but continues to experience significant shortness of breath and chest heaviness, especially after climbing stairs. Oxygen saturation drops to 91% with activity. She rates her current  breathing status as 5 out of 10, with 10 being the worst. She feels exhausted after daily activities  and her stamina remains reduced. She uses a walker for mobility and has made adjustments to her living space to improve safety. She has been less active since June or July, but is now attempting to increase her activity and stamina.  She fell two weeks ago, landing on her arm, which remains swollen and bruised but is improving. There is no pain and she has not used her splint. She denies significant discomfort. She continues to experience foot drop, with some improvement after rearranging her living space and raising her chair. She completed her last session of physical therapy last week and is interested in renewing it. She continues to use her walker for ambulation and has not had further falls.  She is currently taking Eliquis  for venous thromboembolism and torsemide , half a tablet daily, which she feels is effective for volume management. Her potassium was recently found to be slightly low (3.2), and she has been advised to increase dietary intake of potassium-rich foods. She notes that her legs and hands are persistently cold and has developed a preference for drinking ice water. She experiences nocturnal throat congestion, waking between 3 and 4 AM with mucus in her throat that she cannot clear, which improves with gargling and hot coffee. She denies nasal congestion and has never been able to expectorate mucus easily.  She is scheduled for a dental cleaning with probing at the end of the month and is concerned about the safety of this procedure while on Eliquis . She is planning future travel, including flying and taking a cruise, and inquires about the safety of these activities. She lives in a two-story home, with her bedroom upstairs, and uses a walker for mobility. She has made adjustments to her living space to improve safety and ease of movement, and is trying to increase her activity and stamina with support from her family.      Review of Systems  Constitutional:  Positive for malaise/fatigue  and weight loss. Negative for chills, diaphoresis and fever.  HENT:  Negative for nosebleeds and sore throat.   Eyes:  Negative for double vision.  Respiratory:  Positive for cough and shortness of breath. Negative for hemoptysis, sputum production and wheezing.   Cardiovascular:  Negative for chest pain, palpitations, orthopnea and leg swelling.  Gastrointestinal:  Negative for abdominal pain, blood in stool, constipation, diarrhea, heartburn, melena, nausea and vomiting.  Genitourinary:  Negative for dysuria, frequency and urgency.  Musculoskeletal:  Negative for back pain and joint pain.  Skin: Negative.  Negative for itching and rash.  Neurological:  Positive for weakness. Negative for dizziness, tingling, focal weakness and headaches.  Endo/Heme/Allergies:  Does not bruise/bleed easily.  Psychiatric/Behavioral:  Negative for depression. The patient is not nervous/anxious and does not have insomnia.     MEDICAL HISTORY:  Past Medical History:  Diagnosis Date   Breast cancer (HCC) 2015   left lumpectomy 04/05/2014 and reexcision 04/27/2014 - radiation treatment   History of COVID-19    Hypertension    Personal history of radiation therapy 2016   Left breast    SURGICAL HISTORY: Past Surgical History:  Procedure Laterality Date   ABDOMINAL HYSTERECTOMY     BREAST BIOPSY Left 03/15/2014   8:00 us  bx, SCLEROSING ADENOSIS AND COLUMNAR CELL CHANGE    BREAST BIOPSY Left 02/27/2014   invasive lobular carcinoma LIQ   BREAST LUMPECTOMY Left 04/03/2014   [facility], DCIS,  [facility], negative LN   BREAST LUMPECTOMY Left 04/27/2014   re-excision for clear margins   COMBINED HYSTEROSCOPY DIAGNOSTIC / D&C     PARTIAL MASTECTOMY WITH AXILLARY SENTINEL LYMPH NODE BIOPSY Left     SOCIAL HISTORY: Social History   Socioeconomic History   Marital status: Married    Spouse name: Not on file   Number of children: Not on file   Years of education: Not on file   Highest education level: Not on file   Occupational History   Not on file  Tobacco Use   Smoking status: Former    Current packs/day: 1.50    Types: Cigarettes   Smokeless tobacco: Never  Vaping Use   Vaping status: Never Used  Substance and Sexual Activity   Alcohol use: Yes    Alcohol/week: 2.0 standard drinks of alcohol    Types: 2 Glasses of wine per week   Drug use: Never   Sexual activity: Never  Other Topics Concern   Not on file  Social History Narrative   Not on file   Social Drivers of Health   Financial Resource Strain: Low Risk  (03/11/2023)   Received from [facility]   Overall Financial Resource Strain (CARDIA)    Difficulty of Paying Living Expenses: Not hard at all  Food Insecurity: Unknown (01/27/2024)   Hunger Vital Sign    Worried About Running Out of Food in the Last Year: Never true    Ran Out of Food in the Last Year: Not on file  Transportation Needs: Unknown (01/27/2024)   PRAPARE - Administrator, Civil Service (Medical): No    Lack of Transportation (Non-Medical): Not on file  Physical Activity: Not on file  Stress: Not on file  Social Connections: Patient Declined (01/03/2024)   Social Connection and Isolation Panel    Frequency of Communication with Friends and Family: Patient declined    Frequency of Social Gatherings with Friends and Family: Patient declined    Attends Religious Services: Patient declined    Database Administrator or Organizations: Patient declined    Attends Banker Meetings: Patient declined    Marital Status: Patient declined  Intimate Partner Violence: Not At Risk (01/03/2024)   Humiliation, Afraid, Rape, and Kick questionnaire    Fear of Current or Ex-Partner: No    Emotionally Abused: No    Physically Abused: No    Sexually Abused: No    FAMILY HISTORY: Family History  Problem Relation Age of Onset   Breast cancer Neg Hx     ALLERGIES:  is allergic to percocet [oxycodone-acetaminophen ], codeine, codone  [hydrocodone], oxycodone hcl, and penicillins.  MEDICATIONS:  Current Outpatient Medications  Medication Sig Dispense Refill   apixaban  (ELIQUIS ) 5 MG TABS tablet TAKE AS DIRECTED, START WITH 2 TABLETS BY MOUTH TWICE DAILY FOR 7 DAYS, THEN ONDAY 8, SWITCH TO 1 TABLET BY MOUTH TWICEDAILY. 74 tablet 0   feeding supplement (ENSURE PLUS HIGH PROTEIN) LIQD Take 237 mLs by mouth 2 (two) times daily between meals. 20000 mL 1   letrozole  (FEMARA ) 2.5 MG tablet TAKE 1 TABLET BY MOUTH DAILY. 90 tablet 1   magic mouthwash (nystatin, hydrocortisone, diphenhydrAMINE, lidocaine ) suspension 5 mLs 4 (four) times daily as needed for mouth pain.     metoprolol  tartrate (LOPRESSOR ) 25 MG tablet Take 25 mg by mouth 2 (two) times daily.     polyethylene glycol powder (MIRALAX ) 17 GM/SCOOP powder Take 17 g by mouth daily as  needed for mild constipation or moderate constipation. Dissolve 1 capful (17g) in 4-8 ounces of liquid and take by mouth daily.     [Paused] torsemide  (DEMADEX ) 20 MG tablet Take 20 mg by mouth in the morning, at noon, in the evening, and at bedtime.     traMADol  (ULTRAM ) 50 MG tablet Take 1 tablet (50 mg total) by mouth every 8 (eight) hours as needed (mild pain). 30 tablet 0   Calcium-Vitamin D  600-200 MG-UNIT per tablet Take 1 tablet by mouth daily. (Patient not taking: Reported on 03/07/2024)     dexamethasone  (DECADRON ) 2 MG tablet Take 1 pill twice a day.  Take the second pill early in the evening.  Take with food. (Patient not taking: Reported on 03/07/2024) 14 tablet 0   doxycycline  (VIBRA -TABS) 100 MG tablet Take 1 tablet (100 mg total) by mouth 2 (two) times daily. (Patient not taking: Reported on 03/07/2024) 8 tablet 0   ondansetron  (ZOFRAN ) 8 MG tablet One pill every 8 hours as needed for nausea/vomitting. (Patient not taking: Reported on 03/07/2024) 40 tablet 1   ribociclib  succ (KISQALI  200MG  DAILY DOSE) 200 MG Therapy Pack Take 1 tablet daily for 21 days on, 7 days off. Repeat every 28  days. 21 tablet 3   vitamin C (ASCORBIC ACID) 500 MG tablet Take 500 mg by mouth daily. (Patient not taking: Reported on 03/07/2024)     No current facility-administered medications for this visit.    PHYSICAL EXAMINATION:   Vitals:   03/07/24 1302  BP: (!) 96/59  Pulse: 76  Resp: 15  Temp: 98.6 F (37 C)  SpO2: 91%   Filed Weights   03/07/24 1302  Weight: 119 lb 9.6 oz (54.3 kg)    Facial puffiness noted.  Right pupil smaller- and drooping noted.   Venous engorgement on the chest noted.   Left upper extremity swollen compared to the right.  Bilateral foot drop noted right more than left.  Physical Exam Vitals and nursing note reviewed.  HENT:     Head: Normocephalic and atraumatic.     Mouth/Throat:     Pharynx: Oropharynx is clear.  Eyes:     Extraocular Movements: Extraocular movements intact.     Pupils: Pupils are equal, round, and reactive to light.  Cardiovascular:     Rate and Rhythm: Normal rate and regular rhythm.  Pulmonary:     Comments: Decreased breath sounds bilaterally.  Abdominal:     Palpations: Abdomen is soft.  Musculoskeletal:        General: Normal range of motion.     Cervical back: Normal range of motion.  Skin:    General: Skin is warm.  Neurological:     General: No focal deficit present.     Mental Status: She is alert and oriented to person, place, and time.  Psychiatric:        Behavior: Behavior normal.        Judgment: Judgment normal.     LABORATORY DATA:  I have reviewed the data as listed Lab Results  Component Value Date   WBC 3.6 (L) 03/07/2024   HGB 12.5 03/07/2024   HCT 38.6 03/07/2024   MCV 107.2 (H) 03/07/2024   PLT 183 03/07/2024   Recent Labs    01/20/24 1046 01/27/24 1332 02/02/24 1338 02/07/24 0827 03/07/24 1246  NA 133*   < > 135 134* 144  K 4.1   < > 4.4 4.3 3.2*  CL 100   < > 100 101  100  CO2 25   < > 26 24 32  GLUCOSE 82   < > 87 105* 125*  BUN 17   < > 18 15 14   CREATININE 0.84   < >  0.78 0.80 1.02*  CALCIUM 9.2   < > 9.1 8.9 9.7  GFRNONAA >60   < > >60 >60 56*  PROT 6.5  --   --  6.0* 6.9  ALBUMIN 3.5  --   --  3.1* 4.1  AST 56*  --   --  32 39  ALT 115*  --   --  29 56*  ALKPHOS 63  --   --  60 75  BILITOT 1.1  --   --  1.1 0.9   < > = values in this interval not displayed.    RADIOGRAPHIC STUDIES: I have personally reviewed the radiological images as listed and agreed with the findings in the report. MR BRAIN W WO CONTRAST Result Date: 03/06/2024 EXAM: MRI BRAIN WITH AND WITHOUT CONTRAST 03/03/2024 11:46:01 AM TECHNIQUE: Multiplanar multisequence MRI of the head/brain was performed with and without the administration of 6 mL gadobutrol  (GADAVIST ) 1 MMOL/ML IV solution. COMPARISON: CT head 01/03/2024 CLINICAL HISTORY: Dizziness, decreasing vision, history of breast cancer. FINDINGS: BRAIN AND VENTRICLES: No acute infarct. No acute intracranial hemorrhage. No mass effect or midline shift. No hydrocephalus. The sella is unremarkable. Normal flow voids. No mass or abnormal enhancement. ORBITS: No acute abnormality. SINUSES: No acute abnormality. BONES AND SOFT TISSUES: Normal bone marrow signal and enhancement. No acute soft tissue abnormality. IMPRESSION: 1. No evidence of acute abnormality or metastatic disease. Electronically signed by: Gilmore Molt MD 03/06/2024 01:53 PM EST RP Workstation: [id number]   CT Chest W Contrast Result Date: 03/06/2024 EXAM: CT CHEST WITH CONTRAST 03/03/2024 11:32:41 AM TECHNIQUE: CT of the chest was performed with the administration of 75 mL of iohexol  (OMNIPAQUE ) 300 MG/ML solution. Multiplanar reformatted images are provided for review. Automated exposure control, iterative reconstruction, and/or weight based adjustment of the mA/kV was utilized to reduce the radiation dose to as low as reasonably achievable. COMPARISON: 01/03/2024 CLINICAL HISTORY: vein compression response to treatment * Tracking Code: BO * FINDINGS: MEDIASTINUM:  Infiltrative soft tissue centered within the eccentric right superior mediastinum is felt to be similar. Challenging to measure secondary to infiltrative morphology. The marked SVC narrowing is similar. No acute thrombus identified. There is also narrowing of the left brachiocephalic vein including on image 39/2. This area was not well opacified on the prior exam so direct comparison is not possible. Infiltrative soft tissue extends into the low right neck including on image 19/2, grossly similar to the prior. Normal heart size, without pericardial effusion. Normal aortic caliber. Mild cardiomegaly with trace pericardial fluid, decreased. 3-vessel coronary artery calcification. Pulmonary artery enlargement, outflow tract 3.5 cm. Aortic atherosclerosis. No central pulmonary embolism on this non-dedicated exam. LUNGS AND PLEURA: No pneumothorax. New small bilateral pleural effusions. Mild centrilobular emphysema. Left base compressive atelectasis. SOFT TISSUES/BONES: Left breast skin thickening is presumably radiation-induced. Anterior chest wall collaterals. Left-sided C7 sclerotic lesion of 11 mm on sagittal image 87, at the site of a lytic lesion of the prior. Mild-to-moderate right hemidiaphragm elevation. UPPER ABDOMEN: Limited images of the upper abdomen demonstrate a small hiatal hernia. Mild gallbladder distention without calcified stone or pericystic edema. Similar left adrenal thickening. IMPRESSION: 1. Relatively similar appearance of infiltrative soft tissue involving the eccentric right superior mediastinum and low right neck, most consistent with metastatic disease. Marked narrowing  of the SVC again identified. No acute thrombus. 2. New small bilateral pleural effusions. 3. Eccentric left C7 metastasis demonstrates new sclerosis, favoring interval healing. 4. Pulmonary artery enlargement suggests pulmonary arterial hypertension. 5. Aortic atherosclerosis ([id number]). Coronary artery atherosclerosis.  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 PET scan- JULY 2025- Confluent abnormal soft tissue along the upper mediastinum. More towards the right side with involvement of the right lung hilum and separate tissue or nodes thoracic inlet on the right side adjacent to the right clavicle corresponding to the finding by prior CT scan. here are 4 hypermetabolic small bony lesions identified including thoracic spine at T1 and T2, the right acetabulum and left scapula worrisome for bony metastatic disease. Awaiting on NGS. SEP 2025-  Currently on on  letrozole  + Ribociclib .   # DEC 5th CT scan- chest:  Relatively similar appearance of infiltrative soft tissue involving the eccentric right superior mediastinum and low right neck, most consistent with metastatic disease. Marked narrowing of the SVC again identified. No acute thrombus; New small bilateral pleural effusions; Eccentric left C7 metastasis demonstrates new sclerosis, favoring interval healing.   # Continue letrozole  plus Ribociclib .  Tolerating fairly well.  Labs within normal limits.  # Ongoing shortness of breath-especially exertion-multifactorial however overall improved since starting treatment. Discussed with Dr. Harrell have the patient follow-up in the clinic for further evaluation.  # Foot drop, right greater than left-no major clinical concerns for CNS metastasis-MRI brain negative for any metastasis.    # SVC syndrome-/ anisocoria-  - s/p RT-  9/29.Dr. Marea.--Noted to have hoarseness of voice and also chest congestion-recommend restarting torsemide  half a pill a day [10 mg a day] monitor closely for blood pressure changes and  also dehydration. DEC 2025- MRI Brain- NEG for any stroke.   # OCT 30th, 2025- Positive for DVT in the left upper extremity- on Eliquis  5 mg BID.  # Hypokalemia- recommend dietary supplementation.    # OSTEOPENIA- [BMD= T score- 1.4]; continue ca+ vit D; exercise; monitor closely on therapy   # IV accesss: difficult with larger needles -patient reluctant with port.    # DISPOSITION: # follow up in 4 weeks- MD; labs- cbc/cmp;Ca 27-29- mag Dr.B    Above plan of care was discussed with patient/family in detail.  My contact information was given to the patient/family.     Cindy JONELLE Joe, MD 03/07/2024 4:23 PM

## 2024-03-07 NOTE — Assessment & Plan Note (Addendum)
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 PET scan- JULY 2025- Confluent abnormal soft tissue along the upper mediastinum. More towards the right side with involvement of the right lung hilum and separate tissue or nodes thoracic inlet on the right side adjacent to the right clavicle corresponding to the finding by prior CT scan. here are 4 hypermetabolic small bony lesions identified including thoracic spine at T1 and T2, the right acetabulum and left scapula worrisome for bony metastatic disease. Awaiting on NGS. SEP 2025-  Currently on on  letrozole  + Ribociclib .   # DEC 5th CT scan- chest:  Relatively similar appearance of infiltrative soft tissue involving the eccentric right superior mediastinum and low right neck, most consistent with metastatic disease. Marked narrowing of the SVC again identified. No acute thrombus; New small bilateral pleural effusions; Eccentric left C7 metastasis demonstrates new sclerosis, favoring interval healing.   # Continue letrozole  plus Ribociclib .  Tolerating fairly well.  Labs within normal limits.  # Ongoing shortness of breath-especially exertion-multifactorial however overall improved since starting treatment. Discussed with Dr. Harrell have the patient follow-up in the clinic for further evaluation.  # Foot drop, right greater than left-no major clinical concerns for CNS metastasis-MRI brain negative for any metastasis.    # SVC syndrome-/ anisocoria-  - s/p RT-  9/29.Dr. Marea.--Noted to have hoarseness of voice and also chest congestion-recommend restarting torsemide  half a pill a day [10 mg a day] monitor closely for blood pressure changes and also dehydration. DEC 2025- MRI Brain- NEG for any stroke.   # OCT 30th, 2025- Positive for DVT in the left upper extremity- on Eliquis  5 mg BID.  # Hypokalemia- recommend dietary  supplementation.    # OSTEOPENIA- [BMD= T score- 1.4]; continue ca+ vit D; exercise; monitor closely on therapy   # IV accesss: difficult with larger needles -patient reluctant with port.    # DISPOSITION: # follow up in 4 weeks- MD; labs- cbc/cmp;Ca 27-29- mag Dr.B

## 2024-03-08 ENCOUNTER — Ambulatory Visit: Payer: Self-pay | Admitting: Internal Medicine

## 2024-03-08 ENCOUNTER — Ambulatory Visit
Admission: RE | Admit: 2024-03-08 | Discharge: 2024-03-08 | Disposition: A | Discharge status: Home / Self Care | Source: Ambulatory Visit | Attending: Radiation Oncology | Admitting: Radiation Oncology

## 2024-03-08 ENCOUNTER — Encounter: Payer: Self-pay | Admitting: Radiation Oncology

## 2024-03-08 DIAGNOSIS — C50919 Malignant neoplasm of unspecified site of unspecified female breast: Secondary | ICD-10-CM

## 2024-03-08 LAB — CANCER ANTIGEN 27.29: CA 27.29: 63.6 U/mL — ABNORMAL HIGH (ref 0.0–38.6)

## 2024-03-08 NOTE — Progress Notes (Signed)
 Report faxed.

## 2024-03-09 NOTE — Progress Notes (Signed)
 Radiation Oncology Follow up Note  Name: Dana Snow   Date:   03/08/2024 MRN:  [id number] DOB: 1945/10/14    This 78 y.o. 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  Patiently is is currently on letrozole  andRibociclib.  Which she is tolerating well.  She overall is doing fairly well she states she continues to be short of breath.  Although her's O2 saturation is in the mid 90s at rest.  She had a fall several weeks ago which caused bruising of her left arm.  That seems to be improving.  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  She also has foot drop right greater than left.  Recent MRI of her brain showed no evidence of intracranial disease.  CT scan of her chest shows relatively similar appearance to the infiltrative soft tissue involving the eccentric right superior mediastinum and low right neck consistent with metastatic disease.  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  There is also new small bilateral pleural effusions.  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  COMPLICATIONS OF TREATMENT: none  FOLLOW UP COMPLIANCE: keeps appointments   PHYSICAL EXAM:  BP (!) 165/85   Pulse 79   Temp 98 F (36.7 C) (Tympanic)   Resp 16   Wt 118 lb (53.5 kg)   BMI 21.58 kg/m  Collateral circulation her anterior chest seems to be improved.  She does have continued swelling of her left upper extremity.  No venous jugular distention.  Lungs are clear cardiac examination shows regular  rate and rhythm.  Abdomen is benign.  RADIOLOGY RESULTS: CT scans of her chest as well as MRI of her brain reviewed compatible with above-stated findings  PLAN: Present time patient continues treatment through medical oncology.  She asked about a repeat PET scan and I will leave that to Dr. B for his determination on that.  At this time I will turn follow-up care over to medical oncology.  I be happy to reevaluate her at any time should further palliative treatment be indicated.  Should there be PET scan performed and shows persistent hypermetabolic activity in the area of previous treatment could always do some additional treatment although we will reserve that decision for Dr. KATHEE.  Patient comprehends my recommendations well.  I would like to take this opportunity to thank you for allowing me to participate in the care of your patient.SABRA Marcey Penton, MD

## 2024-03-13 ENCOUNTER — Other Ambulatory Visit: Payer: Self-pay | Admitting: *Deleted

## 2024-03-13 ENCOUNTER — Other Ambulatory Visit: Payer: Self-pay

## 2024-03-13 DIAGNOSIS — C50312 Malignant neoplasm of lower-inner quadrant of left female breast: Secondary | ICD-10-CM

## 2024-03-13 MED ORDER — TORSEMIDE 10 MG PO TABS: 10.0000 mg | ORAL_TABLET | Freq: Every day | ORAL | 3 refills | Status: DC

## 2024-03-13 NOTE — Telephone Encounter (Signed)
 RN spoke w/ Rosaline and Dr. Corky. Script for Toresemide 10 mg sent to pharmacy. Patient made aware that she needs to stay on the 10 mg dosing and Dr. B has sent a 10 mg dosing script to the pharmacy. Patient will not need to cut this dosing in half.

## 2024-03-13 NOTE — Telephone Encounter (Signed)
 Patient called to clarify if she should be taking a half of tablet of torsemide  (10 mg daily). If so, pt requires a refill. I reviewed Dr. Rona note. He last recommend restarting torsemide  half a pill a day [10 mg a day] monitor closely for blood pressure changes and also dehydration.

## 2024-03-14 ENCOUNTER — Other Ambulatory Visit: Payer: Self-pay

## 2024-03-15 ENCOUNTER — Telehealth: Payer: Self-pay | Admitting: *Deleted

## 2024-03-15 NOTE — Telephone Encounter (Signed)
 incoming call from [employer] for [facility]. Mark requesting V/o for [facility] P for 2 x a week for 3 weeks and then 1 x a week for 3 weeks.  V/o Given on behalf of Dr. Rennie.

## 2024-03-16 ENCOUNTER — Other Ambulatory Visit: Payer: Self-pay

## 2024-03-17 ENCOUNTER — Other Ambulatory Visit: Payer: Self-pay

## 2024-03-20 ENCOUNTER — Telehealth: Payer: Self-pay | Admitting: *Deleted

## 2024-03-20 NOTE — Telephone Encounter (Signed)
 Patient called triage to inquire if any otc decongestant meds would contraindicate with her Kisqali  or Toresmide. She stated that the Mucinex helps her but she wants to make sure this does not interfere with current meds. She denies any fever, chills, sore throat or severe shortness of breath.  She does report mild itching and inquired if the kisquali could cause her to itch. She believes she just has dry skin. Denies any signs of rash. I advised patient to use moisturizing creams to help with the itching.

## 2024-03-20 NOTE — Telephone Encounter (Signed)
 Spoke with patient. She understands that she can take Mucinex for nasal congestion. I asked the patient call our office back if her symptoms worsen/or do not improve. She thanked me for calling her back.

## 2024-03-21 ENCOUNTER — Other Ambulatory Visit: Payer: Self-pay

## 2024-03-22 ENCOUNTER — Telehealth: Payer: Self-pay

## 2024-03-22 NOTE — Telephone Encounter (Signed)
 Jori OT therapist with [facility] calling for verbal orders for OT once a week for 5 weeks.  Also requesting approval for range of motion exercise of left upper extremity.

## 2024-03-24 ENCOUNTER — Other Ambulatory Visit: Payer: Self-pay

## 2024-03-24 NOTE — Progress Notes (Signed)
 Specialty Pharmacy Ongoing Clinical Assessment Note  Dana Snow is a 78 y.o. female who is being followed by the specialty pharmacy service for RxSp Oncology   Patient's specialty medication(s) reviewed today: Ribociclib  Succinate (KISQALI  200mg  Daily Dose)   Missed doses in the last 4 weeks: 0   Patient/Caregiver did not have any additional questions or concerns.   Therapeutic benefit summary: Patient is achieving benefit   Adverse events/side effects summary: No adverse events/side effects   Patient's therapy is appropriate to: Continue    Goals Addressed             This Visit's Progress    Slow Disease Progression   On track    Patient is on track. Patient will maintain adherence. Notes from OV on 03/07/24 stated that CT is December demonstrated new sclerosis in some areas, which may favor interval healing.         Follow up: 3 months  [facility]

## 2024-03-25 ENCOUNTER — Other Ambulatory Visit: Payer: Self-pay | Admitting: Oncology

## 2024-03-25 DIAGNOSIS — I82622 Acute embolism and thrombosis of deep veins of left upper extremity: Secondary | ICD-10-CM

## 2024-03-27 ENCOUNTER — Other Ambulatory Visit: Payer: Self-pay | Admitting: Oncology

## 2024-03-27 DIAGNOSIS — I82622 Acute embolism and thrombosis of deep veins of left upper extremity: Secondary | ICD-10-CM

## 2024-04-04 ENCOUNTER — Other Ambulatory Visit: Payer: Self-pay

## 2024-04-05 ENCOUNTER — Telehealth: Payer: Self-pay | Admitting: *Deleted

## 2024-04-05 ENCOUNTER — Other Ambulatory Visit: Payer: Self-pay

## 2024-04-05 DIAGNOSIS — Z170 Estrogen receptor positive status [ER+]: Secondary | ICD-10-CM

## 2024-04-05 NOTE — Telephone Encounter (Addendum)
 She can be seen in [facility] clinic for her concerns. Thank You. Caller verified using pt's full name and dob prior to discussing PHI    Patient called to request for a sooner apt with Dr. B than next week. She is currently experiencing mild wheezing and hoarseness in the morning and notes increase in mucous production in am. She notes that her wheezing does improve by mid-morning. She does not use an inhaler.Patient using mucinex to treat symptoms. She denies any fever or any worsening shortness of breath or severe cough. She is not in any acute distress. She reports that a month ago, she has a pleural effusion noted on her CT scan. She is wanting to know if Dr. B would like to order a chest xray or another pet scan in near future given the persistence in the wheezing pattern in the morning.  She stated that she is simply requesting to move her apts up to further discuss follow-up plan.      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    Symptom Triage Protocol:  Dyspnea/wheezing; denies any cough. Wheezing does improve by mid-morning. Patient using mucinex to treat symptoms    History of the problem:  Dyspnea Attempt to obtain patient reported VS: patient does not have acess to device to check her vitals at home. However, she reports normal vitals at her PT visit yesterday. Home Pulse Oximetry:  94 % RA (taken by PT team yesterday per patient), Respiratory rate:  not recorded, Heart Rate:not recorded,  . Blood Pressure:  not recorded.  Adventitious respiratory sounds heard by phone: Wheezes  Associated symptoms: Dyspnea -mild   Patient reported cyanosis or pallor: No  Timing of symptoms: Symptoms started last week of increase mucous production.  Aggravating factors: Increased mucous production in the morning.   Are these acute or chronic symptoms: chronic  Patient reported changes in mental status: none  Changes in ADLs: none     Smoking history: Never smoker     Additional commentary: Has not received an incentive spirometer; has not had follow-up with Dr. Aleskerov. She has seen pulmonary in the past for h/o shortness of breath      Triage Nurse Guidance Signs and Symptoms Action  Sudden, unexpected increase in dyspnea at rest Chest pain Frothy pink sputum or gross hemoptysis Facial swelling Change in mental status Seek emergency care. Call an ambulance immediately.  Temperature higher than 100.29F (38C) with suspected neutropenia Seek emergency care.   Increasing dyspnea with activity Temperature higher than 101.62F (38.6C) with-out suspected neutropenia Increased edema or swelling Change in cough or sputum production Uncontrollable cough New-onset wheezing  Seek medical care within 24 hours.  Cough Shortness of breath Follow homecare instructions and seek medical care if no improvement within 24-48 hours.       Provider Consulted  Provider name and credentials: Msg sent to Dr. Rennie and [facility] apps  Provider instruction: [facility] apts recommended to further evaluate her concerns. I spoke with Sidra, NP. He stated that he would be able to see pt tomorrow 04/06/24    . Patient Instruction  Disclaimer:  Patient specific and [employer] instruction provided verbally and sent  through MyChart for active users.    Comply with medical therapy (e.g., antibiotics, antitussives, bronchodilators, opioids), respiratory treatment, oxygen, as prescribed.  Schedule activities that require more exertion (e.g., bathing) around periods of rest.  Get adequate sleep and rest.  Drink plenty (1-2 L) of fluids  (unless restricted because of cardiac dysfunction or kidney disease) to help thin out secretions (unless underlying congestive heart failure is present).  Avoid precipitating factors that worsen dyspnea (e.g., anxiety, perfumes, tobacco smoke, cold or dry air). Stay inside on days when the air quality is poor.  Monitor for fever or any sign of infection and avoid contact with individuals who are sick.  Sitting upright and pursed-lip breathing can lessen dyspnea, as can relaxation training.   Report the Following Problems: Fever (temperature higher than 101.36F [38.6C], or 100.3F [38.1C] if receiving chemotherapy or if neutropenia is suspected) Change in sputum production (color, hemoptysis) Swelling of the feet or hands   Seek Emergency Care Immediately if Any of the Following Occurs: Worsening dyspnea, especially if accompanied by chest pain or gross hemoptysis Swelling of the face Increased work of breathing (elevated respiratory or heart rate) Changes in mental status (somnolence, restlessness, confusion) Temperature higher than 100.3F (38C) with known neutropenia   Teach Back Method used:  Yes    Nurse Triage Priority:  Urgent (obtain medical orders as indicated with instruction for 8-24 hour or sooner follow-up)  Barriers to Care:  None identified  Nurse Triage Disposition:  In-person follow-up visit:  recommended but patient declines. She states, I will just wait until next week. I don't see any urgency in coming in sooner. Patient informed that if she should change her mind on smc apt to call our office back. Mychart msg sent to review pt instructions.   Protocol Source:  Ruthine HERO., & Eldonna CANDIE Pee.). (2019). Telephone triage for oncology nurses (3rd ed.). Oncology Nursing Society.

## 2024-04-05 NOTE — Telephone Encounter (Signed)
 Caller verified using pt's full name and dob prior to discussing PHI . Returned phone call with provider's recommendations. In-person follow-up visit:  recommended but patient declines. She states, I will just wait until next week. I don't see any urgency in coming in sooner. Patient informed that if she should change her mind on smc apt to call our office back.

## 2024-04-09 ENCOUNTER — Other Ambulatory Visit: Payer: Self-pay

## 2024-04-09 ENCOUNTER — Observation Stay
Admission: EM | Admit: 2024-04-09 | Discharge: 2024-04-10 | Disposition: A | Discharge status: Home / Self Care | Attending: Student | Admitting: Student

## 2024-04-09 ENCOUNTER — Emergency Department

## 2024-04-09 DIAGNOSIS — J9601 Acute respiratory failure with hypoxia: Secondary | ICD-10-CM | POA: Diagnosis not present

## 2024-04-09 DIAGNOSIS — J9691 Respiratory failure, unspecified with hypoxia: Secondary | ICD-10-CM | POA: Diagnosis present

## 2024-04-09 DIAGNOSIS — I5030 Unspecified diastolic (congestive) heart failure: Secondary | ICD-10-CM | POA: Insufficient documentation

## 2024-04-09 DIAGNOSIS — C50919 Malignant neoplasm of unspecified site of unspecified female breast: Secondary | ICD-10-CM | POA: Diagnosis not present

## 2024-04-09 DIAGNOSIS — R1310 Dysphagia, unspecified: Secondary | ICD-10-CM | POA: Diagnosis not present

## 2024-04-09 DIAGNOSIS — Z7901 Long term (current) use of anticoagulants: Secondary | ICD-10-CM | POA: Diagnosis not present

## 2024-04-09 DIAGNOSIS — Z8616 Personal history of COVID-19: Secondary | ICD-10-CM | POA: Insufficient documentation

## 2024-04-09 DIAGNOSIS — Z86718 Personal history of other venous thrombosis and embolism: Secondary | ICD-10-CM | POA: Insufficient documentation

## 2024-04-09 DIAGNOSIS — J441 Chronic obstructive pulmonary disease with (acute) exacerbation: Secondary | ICD-10-CM | POA: Diagnosis not present

## 2024-04-09 DIAGNOSIS — I10 Essential (primary) hypertension: Secondary | ICD-10-CM | POA: Diagnosis present

## 2024-04-09 DIAGNOSIS — Z853 Personal history of malignant neoplasm of breast: Secondary | ICD-10-CM | POA: Diagnosis not present

## 2024-04-09 DIAGNOSIS — I3139 Other pericardial effusion (noninflammatory): Secondary | ICD-10-CM | POA: Insufficient documentation

## 2024-04-09 DIAGNOSIS — Z79811 Long term (current) use of aromatase inhibitors: Secondary | ICD-10-CM | POA: Insufficient documentation

## 2024-04-09 DIAGNOSIS — Z79899 Other long term (current) drug therapy: Secondary | ICD-10-CM | POA: Insufficient documentation

## 2024-04-09 DIAGNOSIS — Z87891 Personal history of nicotine dependence: Secondary | ICD-10-CM | POA: Insufficient documentation

## 2024-04-09 DIAGNOSIS — I110 Hypertensive heart disease with heart failure: Secondary | ICD-10-CM | POA: Insufficient documentation

## 2024-04-09 DIAGNOSIS — I82409 Acute embolism and thrombosis of unspecified deep veins of unspecified lower extremity: Secondary | ICD-10-CM | POA: Insufficient documentation

## 2024-04-09 DIAGNOSIS — R0602 Shortness of breath: Secondary | ICD-10-CM | POA: Diagnosis present

## 2024-04-09 LAB — BASIC METABOLIC PANEL WITH GFR
Anion gap: 9 (ref 5–15)
BUN: 13 mg/dL (ref 8–23)
CO2: 30 mmol/L (ref 22–32)
Calcium: 9.7 mg/dL (ref 8.9–10.3)
Chloride: 101 mmol/L (ref 98–111)
Creatinine, Ser: 0.84 mg/dL (ref 0.44–1.00)
GFR, Estimated: >60 mL/min
Glucose, Bld: 117 mg/dL — ABNORMAL HIGH (ref 70–99)
Potassium: 3.6 mmol/L (ref 3.5–5.1)
Sodium: 140 mmol/L (ref 135–145)

## 2024-04-09 LAB — CBC
HCT: 42.5 % (ref 36.0–46.0)
Hemoglobin: 13.9 g/dL (ref 12.0–15.0)
MCH: 35.5 pg — ABNORMAL HIGH (ref 26.0–34.0)
MCHC: 32.7 g/dL (ref 30.0–36.0)
MCV: 108.4 fL — ABNORMAL HIGH (ref 80.0–100.0)
Platelets: 177 K/uL (ref 150–400)
RBC: 3.92 MIL/uL (ref 3.87–5.11)
RDW: 14.8 % (ref 11.5–15.5)
WBC: 4.8 K/uL (ref 4.0–10.5)
nRBC: 0 % (ref 0.0–0.2)

## 2024-04-09 LAB — RESP PANEL BY RT-PCR (RSV, FLU A&B, COVID)  RVPGX2
Influenza A by PCR: NEGATIVE
Influenza B by PCR: NEGATIVE
Resp Syncytial Virus by PCR: NEGATIVE
SARS Coronavirus 2 by RT PCR: NEGATIVE

## 2024-04-09 LAB — BLOOD GAS, VENOUS
Acid-Base Excess: 12.9 mmol/L — ABNORMAL HIGH (ref 0.0–2.0)
Bicarbonate: 40.2 mmol/L — ABNORMAL HIGH (ref 20.0–28.0)
O2 Saturation: 50.2 %
Patient temperature: 37
pCO2, Ven: 62 mmHg — ABNORMAL HIGH (ref 44–60)
pH, Ven: 7.42 (ref 7.25–7.43)
pO2, Ven: 33 mmHg (ref 32–45)

## 2024-04-09 LAB — TROPONIN T, HIGH SENSITIVITY: Troponin T High Sensitivity: 23 ng/L — ABNORMAL HIGH (ref 0–19)

## 2024-04-09 LAB — PRO BRAIN NATRIURETIC PEPTIDE: Pro Brain Natriuretic Peptide: 1107 pg/mL — ABNORMAL HIGH

## 2024-04-09 LAB — MAGNESIUM: Magnesium: 2.3 mg/dL (ref 1.7–2.4)

## 2024-04-09 MED ORDER — SODIUM CHLORIDE 0.9 % IV SOLN
2.0000 g | Freq: Two times a day (BID) | INTRAVENOUS | Status: DC
  Administered 2024-04-09 – 2024-04-10 (×3): 2 g via INTRAVENOUS
  Filled 2024-04-09 (×5): qty 12.5

## 2024-04-09 MED ORDER — UMECLIDINIUM-VILANTEROL 62.5-25 MCG/ACT IN AEPB
1.0000 | INHALATION_SPRAY | Freq: Every day | RESPIRATORY_TRACT | Status: DC
  Administered 2024-04-10: 1 via RESPIRATORY_TRACT
  Filled 2024-04-09: qty 14

## 2024-04-09 MED ORDER — LETROZOLE 2.5 MG PO TABS
2.5000 mg | ORAL_TABLET | Freq: Every day | ORAL | Status: DC
  Administered 2024-04-10: 2.5 mg via ORAL
  Filled 2024-04-09: qty 1

## 2024-04-09 MED ORDER — METHYLPREDNISOLONE SODIUM SUCC 125 MG IJ SOLR
60.0000 mg | Freq: Two times a day (BID) | INTRAMUSCULAR | Status: AC
  Administered 2024-04-09 – 2024-04-10 (×2): 60 mg via INTRAVENOUS
  Filled 2024-04-09 (×2): qty 2

## 2024-04-09 MED ORDER — APIXABAN 5 MG PO TABS
5.0000 mg | ORAL_TABLET | Freq: Two times a day (BID) | ORAL | Status: DC
  Administered 2024-04-09 – 2024-04-10 (×2): 5 mg via ORAL
  Filled 2024-04-09 (×2): qty 1

## 2024-04-09 MED ORDER — PHENOL 1.4 % MT LIQD
1.0000 | OROMUCOSAL | Status: DC | PRN
  Administered 2024-04-09: 1 via OROMUCOSAL
  Filled 2024-04-09: qty 177

## 2024-04-09 MED ORDER — POLYETHYLENE GLYCOL 3350 17 G PO PACK: 17.0000 g | PACK | Freq: Every day | ORAL | Status: DC | PRN

## 2024-04-09 MED ORDER — FUROSEMIDE 10 MG/ML IJ SOLN
40.0000 mg | Freq: Two times a day (BID) | INTRAMUSCULAR | Status: DC
  Administered 2024-04-09 – 2024-04-10 (×2): 40 mg via INTRAVENOUS
  Filled 2024-04-09 (×2): qty 4

## 2024-04-09 MED ORDER — METOPROLOL TARTRATE 25 MG PO TABS
25.0000 mg | ORAL_TABLET | Freq: Two times a day (BID) | ORAL | Status: DC
  Administered 2024-04-10: 25 mg via ORAL
  Filled 2024-04-09: qty 1

## 2024-04-09 MED ORDER — METHYLPREDNISOLONE SODIUM SUCC 125 MG IJ SOLR
125.0000 mg | Freq: Once | INTRAMUSCULAR | Status: AC
  Administered 2024-04-09: 125 mg via INTRAVENOUS
  Filled 2024-04-09: qty 2

## 2024-04-09 MED ORDER — IPRATROPIUM-ALBUTEROL 0.5-2.5 (3) MG/3ML IN SOLN
3.0000 mL | Freq: Once | RESPIRATORY_TRACT | Status: AC
  Administered 2024-04-09: 3 mL via RESPIRATORY_TRACT
  Filled 2024-04-09: qty 3

## 2024-04-09 MED ORDER — METOPROLOL TARTRATE 25 MG PO TABS: 25.0000 mg | ORAL_TABLET | Freq: Two times a day (BID) | ORAL | Status: DC

## 2024-04-09 MED ORDER — RIBOCICLIB SUCC (200 MG DOSE) 200 MG PO TBPK: 200.0000 mg | ORAL_TABLET | Freq: Every day | ORAL | Status: DC

## 2024-04-09 MED ORDER — METOPROLOL TARTRATE 25 MG PO TABS
25.0000 mg | ORAL_TABLET | Freq: Once | ORAL | Status: AC
  Administered 2024-04-09: 25 mg via ORAL
  Filled 2024-04-09: qty 1

## 2024-04-09 MED ORDER — IOHEXOL 350 MG/ML SOLN
75.0000 mL | Freq: Once | INTRAVENOUS | Status: AC | PRN
  Administered 2024-04-09: 75 mL via INTRAVENOUS

## 2024-04-09 MED ORDER — IPRATROPIUM-ALBUTEROL 0.5-2.5 (3) MG/3ML IN SOLN
3.0000 mL | Freq: Four times a day (QID) | RESPIRATORY_TRACT | Status: DC
  Administered 2024-04-09 – 2024-04-10 (×4): 3 mL via RESPIRATORY_TRACT
  Filled 2024-04-09 (×4): qty 3

## 2024-04-09 MED ORDER — POTASSIUM CHLORIDE 10 MEQ/100ML IV SOLN
10.0000 meq | INTRAVENOUS | Status: AC
  Administered 2024-04-09 (×2): 10 meq via INTRAVENOUS
  Filled 2024-04-09 (×2): qty 100

## 2024-04-09 MED ORDER — PREDNISONE 20 MG PO TABS
40.0000 mg | ORAL_TABLET | Freq: Every day | ORAL | Status: DC
  Administered 2024-04-10: 40 mg via ORAL
  Filled 2024-04-09: qty 2

## 2024-04-09 MED ORDER — ALBUTEROL SULFATE (2.5 MG/3ML) 0.083% IN NEBU: 2.5000 mg | INHALATION_SOLUTION | RESPIRATORY_TRACT | Status: DC | PRN

## 2024-04-09 NOTE — ED Provider Notes (Signed)
 "  [facility] Provider Note    Event Date/Time   First MD Initiated Contact with Patient 04/09/24 0912     (approximate)   History   Shortness of Breath   HPI  Dana Snow is a 79 y.o. female presenting with concern of shortness of breath.  Over the last 2 days increased shortness of breath with minimal exertion, today was not able to get out of bed causing her to present this time for further assessment and evaluation.  She underlying history of breast cancer, currently on palliative chemotherapy, she is completing course of radiation and the tumor appears to be stable in size it seems.  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  She was apparently found to be hypoxic by EMS and placed on nasal cannula.  Denies having any noted fevers chills nausea vomiting no associated chest pain.  No known history of coronary artery disease per the patient.     Physical Exam   Triage Vital Signs: ED Triage Vitals  Encounter Vitals Group     BP 04/09/24 0839 (!) 171/85     Girls Systolic BP Percentile --      Girls Diastolic BP Percentile --      Boys Systolic BP Percentile --      Boys Diastolic BP Percentile --      Pulse Rate 04/09/24 0839 73     Resp 04/09/24 0839 (!) 22     Temp 04/09/24 0839 98 F (36.7 C)     Temp src --      SpO2 04/09/24 0839 96 %     Weight 04/09/24 0838 115 lb (52.2 kg)     Height 04/09/24 0838 5' 2 (1.575 m)     Head Circumference --      Peak Flow --      Pain Score 04/09/24 0838 0     Pain Loc --      Pain Education --      Exclude from Growth Chart --     Most recent vital signs: Vitals:   04/09/24 0839 04/09/24 1318  BP: (!) 171/85 (!) 167/80  Pulse: 73 70  Resp: (!) 22 20  Temp: 98 F (36.7 C) 98 F (36.7 C)  SpO2: 96% 96%     General: Awake, no distress.   CV:  Good peripheral perfusion.  No appreciated swelling in the extremities Resp:  Normal effort.  Faint rhonchi and rales appreciated in the lower lobes bilaterally, left greater than right Abd:  No distention.  Soft nontender Other:     ED Results / Procedures / Treatments   Labs (all labs ordered are listed, but only abnormal results are displayed) Labs Reviewed  BASIC METABOLIC PANEL WITH GFR - Abnormal; Notable for the following components:      Result Value   Glucose, Bld 117 (*)    All other components within normal limits  CBC - Abnormal; Notable for the following components:   MCV 108.4 (*)    MCH 35.5 (*)    All other components within normal limits  PRO BRAIN NATRIURETIC PEPTIDE - Abnormal; Notable for the following components:   Pro Brain Natriuretic Peptide 1,107.0 (*)    All other components within normal limits  TROPONIN T, HIGH SENSITIVITY - Abnormal; Notable for the  following components:   Troponin T High Sensitivity 23 (*)    All other components within normal limits  RESP PANEL BY RT-PCR (RSV, FLU A&B, COVID)  [id number]     EKG  On my independent interpretation appears to be a sinus rhythm with rate of about 75, axis of -10, intervals appear to be within normal limits, nonspecific T wave inversions appreciated in leads V1 and V2, which are also apparent on EKG from January 04, 2024   RADIOLOGY On my independent interpretation of this EKG, no apparent cardiopulmonary findings  PROCEDURES:  Critical Care performed: Yes, see critical care procedure note(s)  .Critical Care  Performed by: Fernand Rossie HERO, MD Authorized by: Fernand Rossie HERO, MD   Critical care provider statement:    Critical care time (minutes):  30   Critical care was necessary to treat or prevent imminent or life-threatening deterioration of the following conditions:  Respiratory failure   Critical care was time spent personally by me on the following activities:  Development of treatment  plan with patient or surrogate, evaluation of patient's response to treatment, examination of patient, ordering and review of laboratory studies, ordering and review of radiographic studies, ordering and performing treatments and interventions, pulse oximetry, re-evaluation of patient's condition and review of old charts   Care discussed with: admitting provider      MEDICATIONS ORDERED IN ED: Medications  ipratropium-albuterol  (DUONEB) 0.5-2.5 (3) MG/3ML nebulizer solution 3 mL (3 mLs Nebulization Given 04/09/24 1003)  iohexol  (OMNIPAQUE ) 350 MG/ML injection 75 mL (75 mLs Intravenous Contrast Given 04/09/24 1227)  ipratropium-albuterol  (DUONEB) 0.5-2.5 (3) MG/3ML nebulizer solution 3 mL (3 mLs Nebulization Given 04/09/24 1345)  methylPREDNISolone  sodium succinate (SOLU-MEDROL ) 125 mg/2 mL injection 125 mg (125 mg Intravenous Given 04/09/24 1500)     IMPRESSION / MDM / ASSESSMENT AND PLAN / ED COURSE  I reviewed the triage vital signs and the nursing notes.                               Patient's presentation is most consistent with acute presentation with potential threat to life or bodily function.  79 year old female with history of known breast cancer currently receiving chemotherapy who presents today with concern of weakness and increased shortness of breath.  She is currently on 4 L nasal cannula, she is able to speak full sentences while conversing and appears well at this time.  She does have faint wheezes appreciated lower lobes, labs so far are reassuring she is hypertensive here.  Following labs, will obtain CT imaging given that she does have a known DVT, will follow-up results and determine further workup accordingly.   Clinical Course as of 04/09/24 1526  Sun Apr 09, 2024  1038 Patient with significant elevation in her BNP, chest x-ray also reading is increased vascular congestion which may be what is resulting in her increased shortness of breath.  Awaiting CT imaging results  with anticipated admission afterwards. [SK]  1328 Patient's breathing improved significantly, CT imaging demonstrating some trace bilateral pleural effusions, patient strongly emphasizing that she would like to be discharged home.  Will give her second nebulizer we will have her ambulated here and ensure that she is able to be off the oxygen, if this is reasonable then plan for discharge.  Family is at bedside and they agree with this plan as long as patient is able to ambulate appropriately. [SK]  1446 Attempted to have the patient taken  off the oxygen, unfortunately she does desaturate down to 85%, at this time she is amenable for admission, reached out to medicine for admission at this time. [SK]  1525 Spoke with medicine service who has agreed to evaluate the patient to determine course of further medical management at this time. [SK]    Clinical Course User Index [SK] Fernand Rossie HERO, MD     FINAL CLINICAL IMPRESSION(S) / ED DIAGNOSES   Final diagnoses:  COPD exacerbation (HCC)     Rx / DC Orders   ED Discharge Orders     None        Note:  This document was prepared using Dragon voice recognition software and may include unintentional dictation errors.   Fernand Rossie HERO, MD 04/09/24 1526  "

## 2024-04-09 NOTE — Assessment & Plan Note (Signed)
" °  °  Hypertension Also history of cardiomyopathy current systolic blood pressure in 160s Continue home metoprolol  25 mg twice a day "

## 2024-04-09 NOTE — Assessment & Plan Note (Signed)
 Suspected acute diastolic heart failure Patient has brain atretic peptide 1107 and troponin of 23 no evidence of PE CT findings consistent with probable pulmonary hypertension suspected evidence of right heart failure and volume overload Diuresis with Lasix  40 mg twice daily IV and obtain echocardiogram

## 2024-04-09 NOTE — ED Notes (Signed)
 SVN treatment finished  Noticed o2 sats dropped to 82% on RA Placed on 2 L   [place]   93%

## 2024-04-09 NOTE — Assessment & Plan Note (Signed)
 Known in setting of malignancy Also history of SVC syndrome Continue Apixaban  with caveats above

## 2024-04-09 NOTE — ED Notes (Signed)
 Full rainbow sent to lab.

## 2024-04-09 NOTE — Assessment & Plan Note (Signed)
" °  Breast cancer Continue aromatase inhibitor, Kisqali  (no evidence of pneumonitis on CT) Obtaining ionized calcium "

## 2024-04-09 NOTE — Assessment & Plan Note (Signed)
 Acute COPD exacerbation Patient with CT findings of advanced emphysema Has all cardinal symptoms of COPD exacerbation along with widespread wheeze We will provide IV steroids LABA LAMA and scheduled DuoNebs Will provide cefepime  given the patient's immunocompromise status and high risk for Pseudomonas

## 2024-04-09 NOTE — H&P (Addendum)
 " History and Physical    Patient: Dana Snow FMW:[id number] DOB: Apr 17, 1945 DOA: 04/09/2024 DOS: the patient was seen and examined on 04/09/2024 PCP: Marikay Eva POUR, PA  Patient coming from: Home  Chief Complaint:  Chief Complaint  Patient presents with   Shortness of Breath   HPI:   [name] is a 79 year old female with a past medical history of stage IV invasive mammary carcinoma, SVC syndrome, cardiomyopathy with pericardial effusion, hypertension who presented to the emergency department with acute hypoxic respiratory failure as well as shortness of breath with minimal exertion.  The patient denies any dependent edema but reports she has had orthopnea longstanding and reports she has had wheeze but no fever or chills and relates she has had increased work of breathing and shortness of breath with minimal exertion over the last 2 days.  No chest pain or syncopal symptoms. Edema of upper extremity better than baseline, no neck edema.  Case discussed with the emergency department provider and patient subsequently admitted into the hospital.  Past medical records reviewed and summarized: Primary care visit from March 15, 2024: Respiratory symptoms leading to impaired functional status Medical oncology note 02/07/2024: Breast CA Cardiology note from 12/30/2023: Moderate Pericardial Effusion circumferentially ant-RV>LV post EF >55% Fibrous stranding tissue along the RV ant wall 1-2 mm thickness Mild evidence of RV compromise Consider pericardiocentesis .  Plan  Pericardial effusion, no clear evidence of tamponade, will consider pericardiocentesis if symptoms persist, preferably for a more conservative approach with diuretics   Review of Systems: Cannot complete due to significant dyspnea Past Medical History:  Diagnosis Date   Breast cancer (HCC) 2015   left lumpectomy 04/05/2014 and reexcision 04/27/2014 - radiation treatment   History of COVID-19    Hypertension     Personal history of radiation therapy 2016   Left breast   Past Surgical History:  Procedure Laterality Date   ABDOMINAL HYSTERECTOMY     BREAST BIOPSY Left 03/15/2014   8:00 us  bx, SCLEROSING ADENOSIS AND COLUMNAR CELL CHANGE    BREAST BIOPSY Left 02/27/2014   invasive lobular carcinoma LIQ   BREAST LUMPECTOMY Left 04/03/2014   [facility], DCIS, ALH, negative LN   BREAST LUMPECTOMY Left 04/27/2014   re-excision for clear margins   COMBINED HYSTEROSCOPY DIAGNOSTIC / D&C     PARTIAL MASTECTOMY WITH AXILLARY SENTINEL LYMPH NODE BIOPSY Left    Social History:  reports that she has quit smoking. Her smoking use included cigarettes. She has never used smokeless tobacco. She reports current alcohol use of about 2.0 standard drinks of alcohol per week. She reports that she does not use drugs.  Allergies[1]  Family History  Problem Relation Age of Onset   Breast cancer Neg Hx     Prior to Admission medications  Medication Sig Start Date End Date Taking? Authorizing Provider  apixaban  (ELIQUIS ) 5 MG TABS tablet TAKE ONE TABLET BY MOUTH TWICE DAILY 03/27/24   Rao, Archana C, MD  dextromethorphan-guaiFENesin [facility] DM) 30-600 MG 12hr tablet Take 1 tablet by mouth 2 (two) times daily.    [provider]  feeding supplement (ENSURE PLUS HIGH PROTEIN) LIQD Take 237 mLs by mouth 2 (two) times daily between meals. 01/04/24   Caleen Qualia, MD  letrozole  (FEMARA ) 2.5 MG tablet TAKE 1 TABLET BY MOUTH DAILY. 03/01/24   Brahmanday, Govinda R, MD  magic mouthwash (nystatin, hydrocortisone, diphenhydrAMINE, lidocaine ) suspension 5 mLs 4 (four) times daily as needed for mouth pain.    [provider]  metoprolol   tartrate (LOPRESSOR ) 25 MG tablet Take 25 mg by mouth 2 (two) times daily. 11/19/23 11/18/24  [provider]  ondansetron  (ZOFRAN ) 8 MG tablet One pill every 8 hours as needed for nausea/vomitting. Patient not taking: Reported on 04/05/2024 11/26/23   Brahmanday, Govinda R, MD   polyethylene glycol powder (MIRALAX ) 17 GM/SCOOP powder Take 17 g by mouth daily as needed for mild constipation or moderate constipation. Dissolve 1 capful (17g) in 4-8 ounces of liquid and take by mouth daily. 01/04/24   Amin, Sumayya, MD  ribociclib  succ (KISQALI  200MG  DAILY DOSE) 200 MG Therapy Pack Take 1 tablet daily for 21 days on, 7 days off. Repeat every 28 days. 03/07/24   Brahmanday, Govinda R, MD  torsemide  (DEMADEX ) 10 MG tablet Take 1 tablet (10 mg total) by mouth daily. 03/13/24   Brahmanday, Govinda R, MD  traMADol  (ULTRAM ) 50 MG tablet Take 1 tablet (50 mg total) by mouth every 8 (eight) hours as needed (mild pain). 01/04/24   Caleen Qualia, MD  vitamin C (ASCORBIC ACID) 500 MG tablet Take 500 mg by mouth daily. Patient not taking: Reported on 04/05/2024    [provider]    Physical Exam: Vitals:   04/09/24 0838 04/09/24 0839 04/09/24 1318 04/09/24 1736  BP:  (!) 171/85 (!) 167/80   Pulse:  73 70   Resp:  (!) 22 20   Temp:  98 F (36.7 C) 98 F (36.7 C) 98.5 F (36.9 C)  TempSrc:   Oral Oral  SpO2:  96% 96%   Weight: 52.2 kg     Height: 5' 2 (1.575 m)     Constitutional:  Vital Signs as per Above [facility] than three noted] No Acute Distress Eyes:  Pink Conjunctiva and no Ptosis Neck:     Trachea Midline, Neck Symmetric             Thyroid  without tenderness, palpable masses or nodules Respiratory:   Respiratory Effort Labored: Intermittent Use of Respiratory Muscles,No  Intercostal Retractions             Lungs bilateral wheeze and prolonged expiration to Auscultation Bilaterally Cardiovascular:   Heart Auscultated: Regular Regular without any added sounds or murmurs              No Lower Extremity Edema Gastrointestinal:  Abdomen soft and nontender without palpable masses, guarding or rebound  No Palpable Splenomegaly or Hepatomegaly Psychiatric:  Patient Orientated to Time, Place and Person Patient with appropriate mood and affect Recent and Remote  Memory Intact   Data Reviewed: Labs, Radiology, ECG as detailed in HPI and A/P   Assessment and Plan: * Hypoxic respiratory failure (HCC)   Acute hypoxic respiratory failure Patient dyspneic and saturates to 82% on room air Currently saturating 93% on 2 L No evidence of pneumonitis on CT Plan will be to continue supplemental oxygen and treat underlying causes Primary underlying causes of exacerbation are probable right heart failure as well as COPD exacerbation Do not believe the patient's pericardial effusion is contributory given its chronicity (symptomatology correlating to rapidity), no evidence of progressive SVC syndrome or central airway compromise CT negative for any evidence of pulmonary embolism or pneumonia We will obtain a VBG to exclude any coexistent acute hypercarbia Continue continuous pulse oximetry  COPD with acute exacerbation (HCC) Acute COPD exacerbation Patient with CT findings of advanced emphysema Has all cardinal symptoms of COPD exacerbation along with widespread wheeze We will provide IV steroids LABA LAMA and scheduled DuoNebs Will provide  cefepime  given the patient's immunocompromise status and high risk for Pseudomonas  Breast cancer (HCC)  Breast cancer Continue aromatase inhibitor, Kisqali  (no evidence of pneumonitis on CT) Obtaining ionized calcium  DVT (deep venous thrombosis) (HCC) Known in setting of malignancy Also history of SVC syndrome Continue Apixaban  with caveats above  Diastolic heart failure (HCC) Suspected acute diastolic heart failure Patient has brain atretic peptide 1107 and troponin of 23 no evidence of PE CT findings consistent with probable pulmonary hypertension suspected evidence of right heart failure and volume overload Diuresis with Lasix  40 mg twice daily IV and obtain echocardiogram   Pericardial effusion  Pericardial effusion Unlikely to be the cause of her presenting symptoms given the  chronicity Outpatient cardiology notes from October of last year recommended diuretics and note Moderate Pericardial Effusion circumferentially ant-RV>LV post CT today noted  Small to moderate pericardial effusion Prolonged discussion with the patient and the patient's daughter and that I preferred to stop apixaban  and start heparin given its reversibility pending echocardiogram findings at next apixaban  dose due however following shared decision making with the patient she was against this strategy understanding all the risk and benefits.  Explained differential would be hemorrhagic versus malignant excetra.   Specifically have posed this question this is part of the cardiology consult request. Plan: Repeat echocardiogram, cardiology consultation, ongoing risk/benefit review of continue apixaban     Essential hypertension    Hypertension Also history of cardiomyopathy current systolic blood pressure in 160s Continue home metoprolol  25 mg twice a day      Advance Care Planning:   Code Status: Full Code as per patient  Consults: Cardiology [EMR order and secure chat sent]  Family Communication: Daughter Katie at bedside  Severity of Illness: The appropriate patient status for this patient is INPATIENT. Inpatient status is judged to be reasonable and necessary in order to provide the required intensity of service to ensure the patient's safety. The patient's presenting symptoms, physical exam findings, and initial radiographic and laboratory data in the context of their chronic comorbidities is felt to place them at high risk for further clinical deterioration. Furthermore, it is not anticipated that the patient will be medically stable for discharge from the hospital within 2 midnights of admission.   * I certify that at the point of admission it is my clinical judgment that the patient will require inpatient hospital care spanning beyond 2 midnights from the point of admission due to high  intensity of service, high risk for further deterioration and high frequency of surveillance required.*  Upon my evaluation, this patient had a high probability of imminent or life-threatening deterioration due to acute hypoxic respiratory failure which required my direct attention, intervention, and personal management. I have personally provided 48 minutes of critical care time exclusive of time spent on separately billable procedures. Time includes review of laboratory data, radiology results, discussion with consultants, and monitoring for potential decompensation. Interventions were performed as documented above.  7:03 PM  Notified asymptomatic PVC trigeminy ->Gave BB now metoprolol  25 ->12 lead ->IV KCL to aim >4 ->stat Mg levels If remains stable, on tele to capture overall burden and cards consult pending  Twelve-lead ECG from 8:39 AM this morning independently reviewed and interpreted which is in normal sinus rhythm low voltage QRS but no electrical alternans, no ischemic ST changes although significant artifact will await this evening's for comparison, no PVC on AM ECG  Author: Prentice JAYSON Lowenstein, MD 04/09/2024 5:59 PM  For on call review [email or web address].      [  1]  Allergies Allergen Reactions   Percocet [email or web address] ] Other (See Comments)    Loopy    Codeine    Codone [Hydrocodone] Other (See Comments)    hyperactive   Oxycodone Hcl    Penicillins Rash   "

## 2024-04-09 NOTE — ED Triage Notes (Signed)
 Pt comes with sob via EMS from home. Pt was 91% RA with EMS. Pt placed on 4L and now at 100%. Pt has hx of cancer and tumor that is pressing on her lung causing the sob. Pt also blood clot left arm and on eliquis  for that.  83 10% 4L 167/74 CBG 101 98.2 temp  Lungs clear by ems and EKG ok

## 2024-04-09 NOTE — Assessment & Plan Note (Signed)
" ° °  Acute hypoxic respiratory failure Patient dyspneic and saturates to 82% on room air Currently saturating 93% on 2 L No evidence of pneumonitis on CT Plan will be to continue supplemental oxygen and treat underlying causes Primary underlying causes are probable right heart failure as well as COPD exacerbation Do not believe the patient's pericardial effusion is contributory given its chronicity (symptomatology correlating to rapidity), no evidence of progressive SVC syndrome or central airway compromise CT negative for any evidence of pulmonary embolism or pneumonia We will obtain a VBG to exclude any coexistent acute hypercarbia Continue continuous pulse oximetry "

## 2024-04-09 NOTE — Assessment & Plan Note (Addendum)
" °  Pericardial effusion Unlikely to be the cause of her presenting symptoms given the chronicity Outpatient cardiology notes from October of last year recommended diuretics and note Moderate Pericardial Effusion circumferentially ant-RV>LV post CT today noted  Small to moderate pericardial effusion Prolonged discussion with the patient and the patient's daughter and that I preferred to stop apixaban  and start heparin given its reversibility pending echocardiogram findings at next apixaban  dose due however following shared decision making with the patient she was against this strategy understanding all the risk and benefits.  Explained differential would be hemorrhagic versus malignant excetra.   Specifically have posed this question this is part of the cardiology consult request. Plan: Repeat echocardiogram, cardiology consultation, ongoing risk/benefit review of continue apixaban    "

## 2024-04-10 ENCOUNTER — Inpatient Hospital Stay: Admit: 2024-04-10 | Discharge: 2024-04-10 | Disposition: A | Attending: Internal Medicine

## 2024-04-10 ENCOUNTER — Other Ambulatory Visit (HOSPITAL_COMMUNITY): Payer: Self-pay

## 2024-04-10 ENCOUNTER — Telehealth (HOSPITAL_COMMUNITY): Payer: Self-pay

## 2024-04-10 ENCOUNTER — Telehealth: Payer: Self-pay | Admitting: *Deleted

## 2024-04-10 ENCOUNTER — Telehealth: Payer: Self-pay | Admitting: Internal Medicine

## 2024-04-10 DIAGNOSIS — J9601 Acute respiratory failure with hypoxia: Secondary | ICD-10-CM | POA: Diagnosis present

## 2024-04-10 LAB — CBC
HCT: 39.7 % (ref 36.0–46.0)
Hemoglobin: 13.2 g/dL (ref 12.0–15.0)
MCH: 35.3 pg — ABNORMAL HIGH (ref 26.0–34.0)
MCHC: 33.2 g/dL (ref 30.0–36.0)
MCV: 106.1 fL — ABNORMAL HIGH (ref 80.0–100.0)
Platelets: 195 K/uL (ref 150–400)
RBC: 3.74 MIL/uL — ABNORMAL LOW (ref 3.87–5.11)
RDW: 14.6 % (ref 11.5–15.5)
WBC: 6.5 K/uL (ref 4.0–10.5)
nRBC: 0 % (ref 0.0–0.2)

## 2024-04-10 LAB — RESPIRATORY PANEL BY PCR

## 2024-04-10 LAB — MAGNESIUM: Magnesium: 2.2 mg/dL (ref 1.7–2.4)

## 2024-04-10 LAB — COMPREHENSIVE METABOLIC PANEL WITH GFR
ALT: 25 U/L (ref 0–44)
AST: 22 U/L (ref 15–41)
Albumin: 4.2 g/dL (ref 3.5–5.0)
Alkaline Phosphatase: 56 U/L (ref 38–126)
Anion gap: 11 (ref 5–15)
BUN: 16 mg/dL (ref 8–23)
CO2: 30 mmol/L (ref 22–32)
Calcium: 9.5 mg/dL (ref 8.9–10.3)
Chloride: 96 mmol/L — ABNORMAL LOW (ref 98–111)
Creatinine, Ser: 0.86 mg/dL (ref 0.44–1.00)
GFR, Estimated: >60 mL/min
Glucose, Bld: 174 mg/dL — ABNORMAL HIGH (ref 70–99)
Potassium: 3.6 mmol/L (ref 3.5–5.1)
Sodium: 136 mmol/L (ref 135–145)
Total Bilirubin: 0.9 mg/dL (ref 0.0–1.2)
Total Protein: 6.8 g/dL (ref 6.5–8.1)

## 2024-04-10 LAB — HIV ANTIBODY (ROUTINE TESTING W REFLEX): HIV Screen 4th Generation wRfx: NONREACTIVE

## 2024-04-10 LAB — ECHOCARDIOGRAM COMPLETE
AR max vel: 3.55 cm2
AV Area VTI: 3.87 cm2
AV Area mean vel: 3.29 cm2
AV Mean grad: 2 mmHg
AV Peak grad: 4.1 mmHg
Ao pk vel: 1.01 m/s
Area-P 1/2: 5.31 cm2
Height: 62 in
MV VTI: 1.97 cm2
S' Lateral: 2.4 cm
Weight: 1840 [oz_av]

## 2024-04-10 MED ORDER — TORSEMIDE 10 MG PO TABS: 20.0000 mg | ORAL_TABLET | Freq: Every day | ORAL | Status: AC

## 2024-04-10 MED ORDER — TORSEMIDE 20 MG PO TABS: 20.0000 mg | ORAL_TABLET | Freq: Every day | ORAL | Status: DC

## 2024-04-10 MED ORDER — PANTOPRAZOLE SODIUM 40 MG PO TBEC: 40.0000 mg | DELAYED_RELEASE_TABLET | Freq: Every day | ORAL | Status: DC

## 2024-04-10 MED ORDER — ADULT MULTIVITAMIN W/MINERALS CH: 1.0000 | ORAL_TABLET | Freq: Every day | ORAL | Status: DC

## 2024-04-10 NOTE — Evaluation (Signed)
 Clinical/Bedside Swallow Evaluation Patient Details  Name: Dana Snow MRN: [id number] Date of Birth: [birth year]  Today's Date: 04/10/2024 Time: SLP Start Time (ACUTE ONLY): 1325 SLP Stop Time (ACUTE ONLY): 1410 SLP Time Calculation (min) (ACUTE ONLY): 45 min  Past Medical History:  Past Medical History:  Diagnosis Date   Breast cancer (HCC) 2015   left lumpectomy 04/05/2014 and reexcision 04/27/2014 - radiation treatment   History of COVID-19    Hypertension    Personal history of radiation therapy 2016   Left breast   Past Surgical History:  Past Surgical History:  Procedure Laterality Date   ABDOMINAL HYSTERECTOMY     BREAST BIOPSY Left 03/15/2014   8:00 us  bx, SCLEROSING ADENOSIS AND COLUMNAR CELL CHANGE    BREAST BIOPSY Left 02/27/2014   invasive lobular carcinoma LIQ   BREAST LUMPECTOMY Left 04/03/2014   [facility], DCIS, ALH, negative LN   BREAST LUMPECTOMY Left 04/27/2014   re-excision for clear margins   COMBINED HYSTEROSCOPY DIAGNOSTIC / D&C     PARTIAL MASTECTOMY WITH AXILLARY SENTINEL LYMPH NODE BIOPSY Left    HPI:  Pt is a 79 year old female with a past medical history of stage IV invasive mammary carcinoma, SVC syndrome, cardiomyopathy with pericardial effusion, DVT hypertension who presented to the emergency department with acute hypoxic respiratory failure as well as shortness of breath with minimal exertion.  [facility] for Carcinoma of lower-inner quadrant of left breast.  Per chart notes: She is currently experiencing mild wheezing and hoarseness in the morning and notes increase in mucous production in am. She notes that her wheezing does improve by mid-morning. She does not use an inhaler.Patient using mucinex to treat symptoms. She denies any fever or any worsening shortness of breath or severe cough. She is not in any acute distress..  Pt ENDORSED s/s of REFLUX when asked; she is NOT on a PPI at home but takes TUMS when I  need to.   Chest Imaging: Lower lung volumes. Increased pulmonary vascular congestion, consider milder developing interstitial edema -- 1. Negative for pulmonary embolus.  2. Ill-defined soft tissue in the low right internal jugular  station, superior mediastinum and prevascular space, previously  characterized as metastatic disease, stable. Osseous metastatic  disease, also stable.  3. Small to moderate pericardial effusion.  4. Small bilateral pleural effusions with atelectasis in the right  middle lobe, lingula and both lower lobes.  5. Aortic atherosclerosis (ICD10-I70.0). Coronary artery  calcification.  6. Enlarged pulmonic trunk, indicative of pulmonary arterial  hypertension.  7.  Emphysema    Assessment / Plan / Recommendation  Clinical Impression   Pt seen for BSE today. Pt awake, sitting in chair in room. Husband present. Pt A/O x4; verbal and engaged in conversation appropriately. Noted inconsistent, min stridor during inhalation during conversation; pt has a baseline of SOB w/ any exertion.  Pt endorsed difficulty w/ Phlegm but stated much improved today. She denied any difficulty swallowing; just cannot get it up or down referring to the Phlegm.  On Daniels O2 support; afebrile. WBC WNL.  OF NOTE: Pt endorses s/s of REFLUX at home; self-medicates w/ TUMS. She reports episodes of Phlegm in the mornings, belching, and improved control of the Phlegm w/ HOB elevated. Vocal quality improves later in the mornings.    Pt appears to present w/ functional oropharyngeal phase swallowing w/ No overt oropharyngeal phase dysphagia appreciated during oral intake of trials, recent lunch meal. No neuromuscular swallowing deficits appreciated.  Pt appears at reduced risk for aspiration from an oropharyngeal phase standpoint following general aspiration precautions. HOWEVER, pt has a baseline presentation of REFLUX(per her report and overt s/s) and episodes of REFLUX behaviors at home. She is not on a PPI.  ANY Esophageal phase Dysmotility or Regurgitation of REFLUX material can increase risk for aspiration of that material during Retrograde flow thus impact Voicing and Pulmonary status.     Pt sat upright in chair and consumed trials of thin liquids Via Straw, and recent lunch meal. No overt, clinical s/s of aspiration noted; none reported by pt/Husband/NSG during meals. Noted clear vocal quality b/t trials, no decline in pulmonary status, no cough during intake w/ this SLP. Oral phase appeared [facility] for bolus management and timely A-P transfer/clearing of material. Oral clearing appropriate. Pt fed self.  OM exam was [facility] w/ no unilateral weakness. Speech clear.    Recommend continue a Regular diet (moistened foods and cut small; alternate food/drink and eat/drink Slowly at meals) w/ thin liquids. General aspiration precautions. REFLUX precautions w/ Rest Breaks during meals/oral intake to allow for Esophageal clearing.  Sitting up/HOB elevated post meals and at night when sleeping.  Consulted MD re: PPI; he has initiated a PPI for pt -- this was discussed w/ pt/Husband also.   Recommend pt f/u w/ GI for assessment/management of REFLUX s/s and tx as indicated. Discussion had w/ pt/Husband re: REFLUX, impact of REFLUX on swallowing and breathing, PPI, behaviors to manage REFLUX, and foods/diet. MD to reconsult ST services if any new needs while admitted. NSG updated. Pt/Husband appreciative of Education information. SLP Visit Diagnosis: Dysphagia, unspecified (R13.10) (suspect potential REFLUX impact; deconditioned; increased stress d/t illness)    Aspiration Risk   (reduced from an oropharyngeal phase standpoint; concern for REFLUX)    Diet Recommendation   Thin;Age appropriate regular (cut/moistened foods) = a Regular diet (moistened foods and cut small; alternate food/drink and eat/drink Slowly at meals) w/ thin liquids. General aspiration precautions. REFLUX precautions w/ Rest Breaks during meals/oral  intake to allow for Esophageal clearing.  Sitting up/HOB elevated post meals and at night when sleeping.   Medication Administration: Whole meds with liquid (vs whole in puree if desired)    Other Recommendations Recommended Consults: Consider GI evaluation;Consider esophageal assessment Oral Care Recommendations: Oral care BID;Patient independent with oral care      Assistance Recommended at Discharge  PRN  Functional Status Assessment Patient has not had a recent decline in their functional status  Frequency and Duration  (n/a)   (n/a)       Prognosis Prognosis for improved oropharyngeal function: Good Barriers to Reach Goals: Time post onset;Severity of deficits Barriers/Prognosis Comment: REFLUX s/s endorsed at home; not on a PPI; suspect potential REFLUX impact; deconditioned; increased stress d/t illness      Swallow Study   General Date of Onset: 04/09/24 HPI: Pt is a 79 year old female with a past medical history of stage IV invasive mammary carcinoma, SVC syndrome, cardiomyopathy with pericardial effusion, DVT hypertension who presented to the emergency department with acute hypoxic respiratory failure as well as shortness of breath with minimal exertion.  Pt is currently being [facility] by the [facility] for Carcinoma of lower-inner quadrant of left breast.  Per chart notes: She is currently experiencing mild wheezing and hoarseness in the morning and notes increase in mucous production in am. She notes that her wheezing does improve by mid-morning. She does not use an inhaler.Patient using mucinex to treat symptoms. She denies any  fever or any worsening shortness of breath or severe cough. She is not in any acute distress..  Pt ENDORSED s/s of REFLUX when asked; she is NOT on a PPI at home but takes TUMS when I need to.   Chest Imaging: Lower lung volumes. Increased pulmonary vascular congestion, consider milder developing interstitial edema -- 1. Negative for pulmonary  embolus.  2. Ill-defined soft tissue in the low right internal jugular  station, superior mediastinum and prevascular space, previously  characterized as metastatic disease, stable. Osseous metastatic  disease, also stable.  3. Small to moderate pericardial effusion.  4. Small bilateral pleural effusions with atelectasis in the right  middle lobe, lingula and both lower lobes.  5. Aortic atherosclerosis (ICD10-I70.0). Coronary artery  calcification.  6. Enlarged pulmonic trunk, indicative of pulmonary arterial  hypertension.  7.  Emphysema Type of Study: Bedside Swallow Evaluation Previous Swallow Assessment: none Diet Prior to this Study: Regular;Thin liquids (Level 0) Temperature Spikes Noted: No (wbc 6.5) Respiratory Status: Nasal cannula (2L) History of Recent Intubation: No Behavior/Cognition: Alert;Cooperative;Pleasant mood (a/o x4) Oral Cavity Assessment: Within Functional Limits Oral Care Completed by SLP: Recent completion by staff Oral Cavity - Dentition: Adequate natural dentition Vision: Functional for self-feeding Self-Feeding Abilities: Able to feed self Patient Positioning: Upright in chair Baseline Vocal Quality:  (slight gravely quality w/ inconsistent stridor on deeper inhalations(~4-5x during session)) Volitional Cough: Strong Volitional Swallow: Able to elicit    Oral/Motor/Sensory Function Overall Oral Motor/Sensory Function: Within functional limits   Ice Chips Ice chips: Not tested   Thin Liquid Thin Liquid: Within functional limits Presentation: Self Fed;Straw (~10 trials)    Nectar Thick Nectar Thick Liquid: Not tested   Honey Thick Honey Thick Liquid: Not tested   Puree Puree: Not tested   Solid     Solid: Within functional limits Presentation: Self Fed (lunch meal earlier) Other Comments: no deficits during        Comer Portugal, MS, CCC-SLP Speech Language Pathologist Rehab Services; [name] [phone]  (ascom) Holiday Mcmenamin 04/10/2024,6:31 PM

## 2024-04-10 NOTE — H&P (Incomplete)
Patient discharged to home with husband.

## 2024-04-10 NOTE — Telephone Encounter (Signed)
 Patient and her daughter called the cancer center to inform Dr. Rennie that she was admitted to room 115 for COPD. She had a drop in oxygen levels to 82% yesterday. Patient/family are requesting consult with Dr. Rennie while patient is in the hospital. She apparently is refusing labs to be drawn today by the inpatient team b/c she is scheduled to have labs drawn this week in the cancer center. I explained to daughter that Dr. Rennie would need to be consulted for an inpt consult. I advised pt to allow the team to draw her labs as recommended by the inpatient team. Pt still adamantly refusing labs per daughter until Dr. Rennie has been consulted. I explained to patient that it is important to allow the patient team to complete the necessary follow-up labs and work-up.

## 2024-04-10 NOTE — Care Management Obs Status (Signed)
 MEDICARE OBSERVATION STATUS NOTIFICATION   Patient Details  Name: Dana Snow MRN: [id number] Date of Birth: 1945-07-14   Medicare Observation Status Notification Given:  Yes    Merilee LOISE Batty, RN 04/10/2024, 6:05 PM

## 2024-04-10 NOTE — Progress Notes (Signed)
 Initial Nutrition Assessment  DOCUMENTATION CODES:   Not applicable  INTERVENTION:   Liberalize diet to 2 gram sodium for wider variety of meal selections -MVI with minerals daily -RD provided Low Sodium Nutrition Therapy handout from [facility] Nutrition Care Manual; attached to AVS/ discharge summary   NUTRITION DIAGNOSIS:   Increased nutrient needs related to cancer and cancer related treatments as evidenced by estimated needs.  GOAL:   Patient will meet greater than or equal to 90% of their needs  MONITOR:   PO intake, Supplement acceptance  REASON FOR ASSESSMENT:   Consult Assessment of nutrition requirement/status  ASSESSMENT:   79 year old female with a past medical history of stage IV invasive mammary carcinoma, SVC syndrome, cardiomyopathy with pericardial effusion, hypertension who presented with acute hypoxic respiratory failure as well as shortness of breath with minimal exertion.  Patient admitted with COPD exacerbation.   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   Spoke with patient, who was sitting in recliner today. She reports feeling well today and has a good appetite. Noted patient consumed 1200% of lunch (Caesar salad) and some chick fila french fries.   Patient is currently on a heart healthy duet. Noted she consumed 50% of of breakfast this morning.   Reviewed weight history. Patient has experienced a 56.4% weight loss over the past 3 months, which is not significant for time frame. Weight history difficult to interpret secondary to presence of edema on exam and history of CHF.   Patient reports that she has no nutritional needs and plans to go home later on today.   Medications reviewed and include lasix  and prednisone .   Labs reviewed.    NUTRITION - FOCUSED PHYSICAL  EXAM:  Flowsheet Row Most Recent Value  Orbital Region No depletion  Upper Arm Region Mild depletion  Thoracic and Lumbar Region No depletion  Buccal Region No depletion  Temple Region No depletion  Clavicle Bone Region No depletion  Clavicle and Acromion Bone Region No depletion  Scapular Bone Region No depletion  Dorsal Hand Mild depletion  Patellar Region No depletion  Anterior Thigh Region No depletion  Posterior Calf Region No depletion  Edema (RD Assessment) Mild  Hair Reviewed  Eyes Reviewed  Mouth Reviewed  Skin Reviewed  Nails Reviewed    Diet Order:   Diet Order             Diet 2 gram sodium Fluid consistency: Thin  Diet effective now                   EDUCATION NEEDS:   Education needs have been addressed  Skin:  Skin Assessment: Reviewed RN Assessment  Last BM:  04/08/24  Height:   Ht Readings from Last 1 Encounters:  04/09/24 5' 2 (1.575 m)    Weight:   Wt Readings from Last 1 Encounters:  04/09/24 52.2 kg    Ideal Body Weight:  50 kg  BMI:  Body mass index is 21.03 kg/m.  Estimated Nutritional Needs:   Kcal:  1550-1750  Protein:  75-90 grams  Fluid:  1.5-1.7 L    Margery ORN, RD, LDN, CDCES Registered Dietitian III Certified Diabetes Care and Education Specialist If unable to reach this RD, please use RD Inpatient group chat on secure chat between hours of 8am-4 pm daily

## 2024-04-10 NOTE — Consult Note (Addendum)
 " [facility] - Inpatient CLINIC CARDIOLOGY CONSULT NOTE       Patient ID: Dana Snow MRN: [id number] DOB/AGE: 09/08/1945 79 y.o.  Admit date: 04/09/2024 Referring Physician Dr. Prentice Core Primary Physician Marikay, Eva POUR, PA  Primary Cardiologist Dr. Florencio Reason for Consultation pericardial effusion  HPI: Dana Snow is a 79 y.o. female  with a past medical history of stage IV invasive breast cancer, history of SVC syndrome, recently diagnosed pericardial effusion, hypertension who presented to the ED on 04/09/2024 for shortness of breath. Cardiology was consulted for further evaluation.   Patient with worsening shortness of breath over the last few days, given her symptoms she decided to come in for evaluation.  Workup in the ED notable for creatinine 0.84, potassium 3.6, hemoglobin 13.9, WBC 4.8. Troponins 23, BNP 1100. EKG in the ED sinus rhythm rate 74 bpm, low voltage.  Respiratory viral panel negative.  Chest x-ray in the ED with pulmonary vascular congestion CTA chest negative for PE but did note small to moderate pericardial effusion with small bilateral pleural effusions.  At the time of evaluation this morning, patient is resting comfortably in hospital bed with husband at bedside as in further detail.  She states that for about 2 days before admission she was having some shortness of breath and noticed some slight swelling in her legs.  States that she could not lay down flat at night she denies any chest discomfort, lightheadedness, dizziness.  Currently on 2 L of supplemental O2 but states that she does not wear oxygen at home.  Has noticed that she has been somewhat congested recently.  Also endorses weakness in her legs.  Review of systems complete and found to be negative unless listed above    Past Medical History:  Diagnosis Date   Breast cancer (HCC) 2015   left lumpectomy 04/05/2014 and reexcision 04/27/2014 - radiation treatment   History of COVID-19    Hypertension     Personal history of radiation therapy 2016   Left breast    Past Surgical History:  Procedure Laterality Date   ABDOMINAL HYSTERECTOMY     BREAST BIOPSY Left 03/15/2014   8:00 us  bx, SCLEROSING ADENOSIS AND COLUMNAR CELL CHANGE    BREAST BIOPSY Left 02/27/2014   invasive lobular carcinoma LIQ   BREAST LUMPECTOMY Left 04/03/2014   [facility], DCIS, AL[facility], negative LN   BREAST LUMPECTOMY Left 04/27/2014   re-excision for clear margins   COMBINED HYSTEROSCOPY DIAGNOSTIC / D&C     PARTIAL MASTECTOMY WITH AXILLARY SENTINEL LYMPH NODE BIOPSY Left     Medications Prior to Admission  Medication Sig Dispense Refill Last Dose/Taking   apixaban  (ELIQUIS ) 5 MG TABS tablet TAKE ONE TABLET BY MOUTH TWICE DAILY 60 tablet 0 04/08/2024 Evening   dextromethorphan-guaiFENesin (MUCINEX DM) 30-600 MG 12hr tablet Take 1 tablet by mouth 2 (two) times daily.   Unknown   letrozole  (FEMARA ) 2.5 MG tablet TAKE 1 TABLET BY MOUTH DAILY. 90 tablet 1 04/08/2024   magic mouthwash (nystatin, hydrocortisone, diphenhydrAMINE, lidocaine ) suspension 5 mLs 4 (four) times daily as needed for mouth pain.   Unknown   metoprolol  tartrate (LOPRESSOR ) 25 MG tablet Take 25 mg by mouth 2 (two) times daily.   04/08/2024   polyethylene glycol powder (MIRALAX ) 17 GM/SCOOP powder Take 17 g by mouth daily as needed for mild constipation or moderate constipation. Dissolve 1 capful (17g) in 4-8 ounces of liquid and take by mouth daily.   Unknown   ribociclib  succ (KISQALI  200MG  DAILY DOSE) 200 MG  Therapy Pack Take 1 tablet daily for 21 days on, 7 days off. Repeat every 28 days. 21 tablet 3 Past Week   torsemide  (DEMADEX ) 10 MG tablet Take 1 tablet (10 mg total) by mouth daily. 30 tablet 3 04/08/2024   traMADol  (ULTRAM ) 50 MG tablet Take 1 tablet (50 mg total) by mouth every 8 (eight) hours as needed (mild pain). 30 tablet 0 Unknown   feeding supplement (ENSURE PLUS HIGH PROTEIN) LIQD Take 237 mLs by mouth 2 (two) times daily between meals. 20000 mL 1     ondansetron  (ZOFRAN ) 8 MG tablet One pill every 8 hours as needed for nausea/vomitting. (Patient not taking: Reported on 02/02/2024) 40 tablet 1    vitamin C (ASCORBIC ACID) 500 MG tablet Take 500 mg by mouth daily. (Patient not taking: Reported on 01/10/2024)      Social History   Socioeconomic History   Marital status: Married    Spouse name: Not on file   Number of children: Not on file   Years of education: Not on file   Highest education level: Not on file  Occupational History   Not on file  Tobacco Use   Smoking status: Former    Current packs/day: 1.50    Types: Cigarettes   Smokeless tobacco: Never  Vaping Use   Vaping status: Never Used  Substance and Sexual Activity   Alcohol use: Yes    Alcohol/week: 2.0 standard drinks of alcohol    Types: 2 Glasses of wine per week   Drug use: Never   Sexual activity: Not Currently  Other Topics Concern   Not on file  Social History Narrative   Not on file   Social Drivers of Health   Tobacco Use: Medium Risk (04/09/2024)   Patient History    Smoking Tobacco Use: Former    Smokeless Tobacco Use: Never    Passive Exposure: Not on file  Financial Resource Strain: Low Risk  (03/15/2024)   Received from [facility]   Overall Financial Resource Strain (CARDIA)    Difficulty of Paying Living Expenses: Not hard at all  Food Insecurity: No Food Insecurity (04/09/2024)   Epic    Worried About Radiation Protection Practitioner of Food in the Last Year: Never true    Ran Out of Food in the Last Year: Never true  Transportation Needs: No Transportation Needs (04/09/2024)   Epic    Lack of Transportation (Medical): No    Lack of Transportation (Non-Medical): No  Physical Activity: Not on file  Stress: Not on file  Social Connections: Socially Integrated (04/09/2024)   Social Connection and Isolation Panel    Frequency of Communication with Friends and Family: More than three times a week    Frequency of Social Gatherings with  Friends and Family: More than three times a week    Attends Religious Services: More than 4 times per year    Active Member of Clubs or Organizations: Yes    Attends Banker Meetings: More than 4 times per year    Marital Status: Married  Catering Manager Violence: Not At Risk (04/09/2024)   Epic    Fear of Current or Ex-Partner: No    Emotionally Abused: No    Physically Abused: No    Sexually Abused: No  Depression (PHQ2-9): Low Risk (03/08/2024)   Depression (PHQ2-9)    PHQ-2 Score: 0  Recent Concern: Depression (PHQ2-9) - Medium Risk (01/20/2024)   Depression (PHQ2-9)    PHQ-2 Score: 7  Alcohol Screen: Not on file  Housing: Low Risk (04/09/2024)   Epic    Unable to Pay for Housing in the Last Year: No    Number of Times Moved in the Last Year: 0    Homeless in the Last Year: No  Utilities: Not At Risk (04/09/2024)   Epic    Threatened with loss of utilities: No  Health Literacy: Not on file    Family History  Problem Relation Age of Onset   Breast cancer Neg Hx      Vitals:   04/10/24 0845 04/10/24 0922 04/10/24 0950 04/10/24 0951  BP: (!) 151/73     Pulse: 87     Resp: 18     Temp: 98.6 F (37 C)     TempSrc:      SpO2: 94% 92% (!) 81% 96%  Weight:      Height:        PHYSICAL EXAM General: Well-appearing elderly female, well nourished, in no acute distress. HEENT: Normocephalic and atraumatic. Neck: No JVD.  Lungs: Normal respiratory effort on 2L [place]. Clear bilaterally to auscultation. No wheezes, crackles, rhonchi.  Heart: HRRR. Normal S1 and S2 without gallops or murmurs.  Abdomen: Non-distended appearing.  Msk: Normal strength and tone for age. Extremities: Warm and well perfused. No clubbing, cyanosis.  Trace edema.  Neuro: Alert and oriented X 3. Psych: Answers questions appropriately.   Labs: Basic Metabolic Panel: Recent Labs    04/09/24 0839  NA 140  K 3.6  CL 101  CO2 30  GLUCOSE 117*  BUN 13  CREATININE 0.84  CALCIUM 9.7   MG 2.3   Liver Function Tests: No results for input(s): AST, ALT, ALKPHOS, BILITOT, PROT, ALBUMIN in the last 72 hours. No results for input(s): LIPASE, AMYLASE in the last 72 hours. CBC: Recent Labs    04/09/24 0839  WBC 4.8  HGB 13.9  HCT 42.5  MCV 108.4*  PLT 177   Cardiac Enzymes: No results for input(s): CKTOTAL, CKMB, CKMBINDEX, TROPONINIHS in the last 72 hours. BNP: No results for input(s): BNP in the last 72 hours. D-Dimer: No results for input(s): DDIMER in the last 72 hours. Hemoglobin A1C: No results for input(s): HGBA1C in the last 72 hours. Fasting Lipid Panel: No results for input(s): CHOL, HDL, LDLCALC, TRIG, CHOLHDL, LDLDIRECT in the last 72 hours. Thyroid  Function Tests: No results for input(s): TSH, T4TOTAL, T3FREE, THYROIDAB in the last 72 hours.  Invalid input(s): FREET3 Anemia Panel: No results for input(s): VITAMINB12, FOLATE, FERRITIN, TIBC, IRON, RETICCTPCT in the last 72 hours.   Radiology: CT Angio Chest PE W and/or Wo Contrast Result Date: 04/09/2024 CLINICAL DATA:  Known DVT. Increased oxygen requirement, shortness of breath. Breast cancer * Tracking Code: BO * EXAM: CT ANGIOGRAPHY CHEST WITH CONTRAST TECHNIQUE: Multidetector CT imaging of the chest was performed using the standard protocol during bolus administration of intravenous contrast. Multiplanar CT image reconstructions and MIPs were obtained to evaluate the vascular anatomy. RADIATION DOSE REDUCTION: This exam was performed according to the departmental dose-optimization program which includes automated exposure control, adjustment of the mA and/or kV according to patient size and/or use of iterative reconstruction technique. CONTRAST:  75mL OMNIPAQUE  IOHEXOL  350 MG/ML SOLN COMPARISON:  03/03/2024. FINDINGS: Cardiovascular: Negative for pulmonary embolus. Atherosclerotic calcification of the aorta and coronary arteries. Enlarged  pulmonic trunk and heart. Small to moderate pericardial effusion. Mediastinum/Nodes: Ill-defined soft tissue in the low right internal jugular station, superior mediastinum and prevascular space, grossly stable from  03/03/2024. Measurements are challenging without opacification of the involved vessels. No discrete mediastinal, hilar or axillary lymph nodes. Air in the esophagus can be seen with dysmotility. Lungs/Pleura: Image quality is degraded by expiratory phase imaging and respiratory motion. Centrilobular emphysema. Areas of volume loss in the right middle lobe, lingula and both lower lobes. Small bilateral pleural effusions, partially loculated on the left. Airway is unremarkable. Upper Abdomen: Visualized portions of the liver, gallbladder, right adrenal gland and right kidney are grossly unremarkable. Musculoskeletal: Degenerative changes in the spine. Scattered mixed lytic and sclerotic lesions predominating in the spine and manubrium. Review of the MIP images confirms the above findings. IMPRESSION: 1. Negative for pulmonary embolus. 2. Ill-defined soft tissue in the low right internal jugular station, superior mediastinum and prevascular space, previously characterized as metastatic disease, stable. Osseous metastatic disease, also stable. 3. Small to moderate pericardial effusion. 4. Small bilateral pleural effusions with atelectasis in the right middle lobe, lingula and both lower lobes. 5. Aortic atherosclerosis (ICD10-I70.0). Coronary artery calcification. 6. Enlarged pulmonic trunk, indicative of pulmonary arterial hypertension. 7.  Emphysema (ICD10-J43.9). Electronically Signed   By: Newell Eke M.D.   On: 04/09/2024 13:15   DG Chest 2 View Result Date: 04/09/2024 EXAM: 2 VIEW(S) XRAY OF THE CHEST 04/09/2024 09:07:00 AM COMPARISON: 01/03/2024 and CT chest 03/04/2023. CLINICAL HISTORY: 79 year old female with shortness of breath. Breast cancer . FINDINGS: LUNGS AND PLEURA: Lower lung volumes.  Elevated right hemidiaphragm.increased pulmonary vascularity diffusely . Centrilobular emphysema demonstrated on CTA last year. No pneumothorax. HEART AND MEDIASTINUM: No acute abnormality of the cardiac and mediastinal silhouettes. BONES AND SOFT TISSUES: No acute osseous abnormality. IMPRESSION: 1. Lower lung volumes. Increased pulmonary vascular congestion, consider milder developing interstitial edema. Electronically signed by: Helayne Hurst MD MD 04/09/2024 09:20 AM EST RP Workstation: [id number]    ECHO pending  TELEMETRY (personally reviewed): Sinus rhythm rate 80s  EKG (personally reviewed): Sinus rhythm rate 74 bpm, low voltage  Data reviewed by me 04/10/2024: last 24h vitals tele labs imaging I/O ED provider note, admission H&P  Principal Problem:   Hypoxic respiratory failure (HCC) Active Problems:   Essential hypertension   Pericardial effusion   Breast cancer (HCC)   COPD with acute exacerbation (HCC)   Diastolic heart failure (HCC)   DVT (deep venous thrombosis) (HCC)    ASSESSMENT AND PLAN:  Dana Snow is a 79 y.o. female  with a past medical history of stage IV invasive breast cancer, history of SVC syndrome, recently diagnosed pericardial effusion, hypertension who presented to the ED on 04/09/2024 for shortness of breath. Cardiology was consulted for further evaluation.   # Pericardial effusion # Stage IV breast cancer # Hx SVC syndrome # Hypertension Patient presented with complaints of worsening shortness of breath over the last few days.  Has known pericardial effusion which was noted on prior echo imaging in August.  CTA chest this admission negative for PE but did note small to moderate pericardial effusion. - Echo ordered, further recommendations pending these results.  This was being done during my evaluation of the patient with Dr. Florencio this morning and appeared to be stable but will await formal imaging report. - Continue IV Lasix  40 mg twice  daily.  ADDENDUM: Patient with overall improvement in her breathing this afternoon. Echo revealed preserved EF and improvement in pericardial effusion compared to outpatient echo 10/2023. Recommend increasing PO torsemide  to 20 mg daily on discharge. Can consider DC this afternoon. Scheduled for follow up 04/18/24 at 10 AM.  This patient's plan of care was discussed and created with Dr. Florencio and he is in agreement.  Signed: Danita Bloch, PA-C  04/10/2024, 10:34 AM [facility] Cardiology      "

## 2024-04-10 NOTE — Progress Notes (Signed)
 sats were 95% on RA at rest. Got up to [facility] and sats down to 86%. Placed on 2L [place] and sats came back up to 97%

## 2024-04-10 NOTE — Discharge Instructions (Addendum)
 Please increase your torsemide  to 20mg  daily and follow up with your cardiology team in 1 week.   Our speech therapy team is recommending protonix , please take this medication once daily.      Low Sodium Nutrition Therapy  Eating less sodium can help you if you have high blood pressure, heart failure, or kidney or liver disease.   Your body needs a little sodium, but too much sodium can cause your body to hold onto extra water. This extra water will raise your blood pressure and can cause damage to your heart, kidneys, or liver as they are forced to work harder.   Sometimes you can see how the extra fluid affects you because your hands, legs, or belly swell. You may also hold water around your heart and lungs, which makes it hard to breathe.   Even if you take medication for blood pressure or a water pill (diuretic) to remove fluid, it is still important to have less salt in your diet.   Check with your primary care provider before drinking alcohol since it may affect the amount of fluid in your body and how your heart, kidneys, or liver work. Sodium in Food A low-sodium meal plan limits the sodium that you get from food and beverages to 1,500-2,000 milligrams (mg) per day. Salt is the main source of sodium. Read the nutrition label on the package to find out how much sodium is in one serving of a food.  Select foods with 140 milligrams (mg) of sodium or less per serving.  You may be able to eat one or two servings of foods with a little more than 140 milligrams (mg) of sodium if you are closely watching how much sodium you eat in a day.  Check the serving size on the label. The amount of sodium listed on the label shows the amount in one serving of the food. So, if you eat more than one serving, you will get more sodium than the amount listed.  Tips Cutting Back on Sodium Eat more fresh foods.  Fresh fruits and vegetables are low in sodium, as well as frozen vegetables and fruits that have  no added juices or sauces.  Fresh meats are lower in sodium than processed meats, such as bacon, sausage, and hotdogs.  Not all processed foods are unhealthy, but some processed foods may have too much sodium.  Eat less salt at the table and when cooking. One of the ingredients in salt is sodium.  One teaspoon of table salt has 2,300 milligrams of sodium.  Leave the salt out of recipes for pasta, casseroles, and soups. Be a engineer, building services.  Food packages that say Salt-free, sodium-free, very low sodium, and low sodium have less than 140 milligrams of sodium per serving.  Beware of products identified as Unsalted, No Salt Added, Reduced Sodium, or Lower Sodium. These items may still be high in sodium. You should always check the nutrition label. Add flavors to your food without adding sodium.  Try lemon juice, lime juice, or vinegar.  Dry or fresh herbs add flavor.  Buy a sodium-free seasoning blend or make your own at home. You can purchase salt-free or sodium-free condiments like barbeque sauce in stores and online. Ask your registered dietitian nutritionist for recommendations and where to find them.   Eating in Restaurants Choose foods carefully when you eat outside your home. Restaurant foods can be very high in sodium. Many restaurants provide nutrition facts on their menus or their websites. If  you cannot find that information, ask your server. Let your server know that you want your food to be cooked without salt and that you would like your salad dressing and sauces to be served on the side.    Foods Recommended Food Group Foods Recommended  Grains Bread, bagels, rolls without salted tops Homemade bread made with reduced-sodium baking powder Cold cereals, especially shredded wheat and puffed rice Oats, grits, or cream of wheat Pastas, quinoa, and rice Popcorn, pretzels or crackers without salt Corn tortillas  Protein Foods Fresh meats and fish; turkey bacon (check  the nutrition labels - make sure they are not packaged in a sodium solution) Canned or packed tuna (no more than 4 ounces at 1 serving) Beans and peas Soybeans) and tofu Eggs Nuts or nut butters without salt  Dairy Milk or milk powder Plant milks, such as rice and soy Yogurt, including Greek yogurt Small amounts of natural cheese (blocks of cheese) or reduced-sodium cheese can be used in moderation. (Swiss, ricotta, and fresh mozzarella cheese are lower in sodium than the others) Cream Cheese Low sodium cottage cheese  Vegetables Fresh and frozen vegetables without added sauces or salt Homemade soups (without salt) Low-sodium, salt-free or sodium-free canned vegetables and soups  Fruit Fresh and canned fruits Dried fruits, such as raisins, cranberries, and prunes  Oils Tub or liquid margarine, regular or without salt Canola, corn, peanut, olive, safflower, or sunflower oils  Condiments Fresh or dried herbs such as basil, bay leaf, dill, mustard (dry), nutmeg, paprika, parsley, rosemary, sage, or thyme.  Low sodium ketchup Vinegar  Lemon or lime juice Pepper, red pepper flakes, and cayenne. Hot sauce contains sodium, but if you use just a drop or two, it will not add up to much.  Salt-free or sodium-free seasoning mixes and marinades Simple salad dressings: vinegar and oil   Foods Not Recommended Food Group Foods Not Recommended  Grains Breads or crackers topped with salt Cereals (hot/cold) with more than 300 mg sodium per serving Biscuits, cornbread, and other quick breads prepared with baking soda Pre-packaged bread crumbs Seasoned and packaged rice and pasta mixes Self-rising flours  Protein Foods Cured meats: Bacon, ham, sausage, pepperoni and hot dogs Canned meats (chili, vienna sausage, or sardines) Smoked fish and meats Frozen meals that have more than 600 mg of sodium per serving Egg substitute (with added sodium)  Dairy Buttermilk Processed cheese spreads Cottage  cheese (1 cup may have over 500 mg of sodium; look for low-sodium.) American or feta cheese Shredded Cheese has more sodium than blocks of cheese String cheese  Vegetables Canned vegetables (unless they are salt-free, sodium-free or low sodium) Frozen vegetables with seasoning and sauces Sauerkraut and pickled vegetables Canned or dried soups (unless they are salt-free, sodium-free, or low sodium) French fries and onion rings  Fruit Dried fruits preserved with additives that have sodium  Oils Salted butter or margarine, all types of olives  Condiments Salt, sea salt, kosher salt, onion salt, and garlic salt Seasoning mixes with salt Bouillon cubes Ketchup Barbeque sauce and Worcestershire sauce unless low sodium Soy sauce Salsa, pickles, olives, relish Salad dressings: ranch, blue cheese, Italian, and French.   Low Sodium Sample 1-Day Menu  Breakfast 1 cup cooked oatmeal  1 slice whole wheat bread toast  1 tablespoon peanut butter without salt  1 banana  1 cup 1% milk  Lunch Tacos made with: 2 corn tortillas   cup black beans, low sodium   cup roasted or grilled chicken (without  skin)   avocado  Squeeze of lime juice  1 cup salad greens  1 tablespoon low-sodium salad dressing   cup strawberries  1 orange  Afternoon Snack 1/3 cup grapes  6 ounces yogurt  Evening Meal 3 ounces herb-baked fish  1 baked potato  2 teaspoons olive oil   cup cooked carrots  2 thick slices tomatoes on:  2 lettuce leaves  1 teaspoon olive oil  1 teaspoon balsamic vinegar  1 cup 1% milk  Evening Snack 1 apple   cup almonds without salt   Low-Sodium Vegetarian (Lacto-Ovo) Sample 1-Day Menu  Breakfast 1 cup cooked oatmeal  1 slice whole wheat toast  1 tablespoon peanut butter without salt  1 banana  1 cup 1% milk  Lunch Tacos made with: 2 corn tortillas   cup black beans, low sodium   cup roasted or grilled chicken (without skin)   avocado  Squeeze of lime juice  1 cup salad  greens  1 tablespoon low-sodium salad dressing   cup strawberries  1 orange  Evening Meal Stir fry made with:  cup tofu  1 cup brown rice   cup broccoli   cup green beans   cup peppers   tablespoon peanut oil  1 orange  1 cup 1% milk  Evening Snack 4 strips celery  2 tablespoons hummus  1 hard-boiled egg   Low-Sodium Vegan Sample 1-Day Menu  Breakfast 1 cup cooked oatmeal  1 tablespoon peanut butter without salt  1 cup blueberries  1 cup soymilk fortified with calcium, vitamin B12, and vitamin D   Lunch 1 small whole wheat pita   cup cooked lentils  2 tablespoons hummus  4 carrot sticks  1 medium apple  1 cup soymilk fortified with calcium, vitamin B12, and vitamin D   Evening Meal Stir fry made with:  cup tofu  1 cup brown rice   cup broccoli   cup green beans   cup peppers   tablespoon peanut oil  1 cup cantaloupe  Evening Snack 1 cup soy yogurt   cup mixed nuts  Copyright 2020  Academy of Nutrition and Dietetics. All rights reserved  Sodium Free Flavoring Tips  When cooking, the following items may be used for flavoring instead of salt or seasonings that contain sodium. Remember: A little bit of spice goes a long way! Be careful not to overseason. Spice Blend Recipe (makes about ? cup) 5 teaspoons onion powder  2 teaspoons garlic powder  2 teaspoons paprika  2 teaspoon dry mustard  1 teaspoon crushed thyme leaves   teaspoon white pepper   teaspoon celery seed Food Item Flavorings  Beef Basil, bay leaf, caraway, curry, dill, dry mustard, garlic, grape jelly, green pepper, mace, marjoram, mushrooms (fresh), nutmeg, onion or onion powder, parsley, pepper, rosemary, sage  Chicken Basil, cloves, cranberries, mace, mushrooms (fresh), nutmeg, oregano, paprika, parsley, pineapple, saffron, sage, savory, tarragon, thyme, tomato, turmeric  Egg Chervil, curry, dill, dry mustard, garlic or garlic powder, green pepper, jelly, mushrooms (fresh), nutmeg,  onion powder, paprika, parsley, rosemary, tarragon, tomato  Fish Basil, bay leaf, chervil, curry, dill, dry mustard, green pepper, lemon juice, marjoram, mushrooms (fresh), paprika, pepper, tarragon, tomato, turmeric  Lamb Cloves, curry, dill, garlic or garlic powder, mace, mint, mint jelly, onion, oregano, parsley, pineapple, rosemary, tarragon, thyme  Pork Applesauce, basil, caraway, chives, cloves, garlic or garlic powder, onion or onion powder, rosemary, thyme  Veal Apricots, basil, bay leaf, currant jelly, curry, ginger, marjoram, mushrooms (fresh), oregano, paprika  Vegetables  Basil, dill, garlic or garlic powder, ginger, lemon juice, mace, marjoram, nutmeg, onion or onion powder, tarragon, tomato, sugar or sugar substitute, salt-free salad dressing, vinegar  Desserts Allspice, anise, cinnamon, cloves, ginger, mace, nutmeg, vanilla extract, other extracts   Copyright 2020  Academy of Nutrition and Dietetics. All rights reserved  Fluid Restricted Nutrition Therapy  You have been prescribed this diet because your condition affects how much fluid you can eat or drink. If your heart, liver, or kidneys aren't working properly, you may not be able to effectively eliminate fluids from the body and this may cause swelling (edema) in the legs, arms, and/or stomach. Drink no more than _________ liters or ________ ounces or ________cups of fluid per day.  You don't need to stop eating or drinking the same fluids you normally would, but you may need to eat or drink less than usual.  Your registered dietitian nutritionist will help you determine the correct amount of fluid to consume during the day Breakfast Include fluids taken with medications  Lunch Include fluids taken with medications  Dinner Include fluids taken with medications  Bedtime Snack Include fluids taken with medications     Tips What Are Fluids?  A fluid is anything that is liquid or anything that would melt if left at room  temperature. You will need to count these foods and liquids--including any liquid used to take medication--as part of your daily fluid intake. Some examples are: Alcohol (drink only with your doctor's permission)  Coffee, tea, and other hot beverages  Gelatin (Jell-O)  Gravy  Ice cream, sherbet, sorbet  Ice cubes, ice chips  Milk, liquid creamer  Nutritional supplements  Popsicles  Vegetable and fruit juices; fluid in canned fruit  Watermelon  Yogurt  Soft drinks, lemonade, limeade  Soups  Syrup How Do I Measure My Fluid Intake? Record your fluid intake daily.  Tip: Every day, each time you eat or drink fluids, pour water in the same amount into an empty container that can hold the same amount of fluids you are allowed daily. This may help you keep track of how much fluid you are taking in throughout the day.  To accurately keep track of how much liquid you take in, measure the size of the cups, glasses, and bowls you use. If you eat soup, measure how much of it is liquid and how much is solid (such as noodles, vegetables, meat). Conversions for Measuring Fluid Intake  Milliliters (mL) Liters (L) Ounces (oz) Cups (c)  1000 1 32 4  1200 1.2 40 5  1500 1.5 50 6 1/4  1800 1.8 60 7 1/2  2000 2 67 8 1/3  Tips to Reduce Your Thirst Chew gum or suck on hard candy.  Rinse or gargle with mouthwash. Do not swallow.  Ice chips or popsicles my help quench thirst, but this too needs to be calculated into the total restriction. Melt ice chips or cubes first to figure out how much fluid they produce (for example, experiment with melting  cup ice chips or 2 ice cubes).  Add a lemon wedge to your water.  Limit how much salt you take in. A high salt intake might make you thirstier.  Don't eat or drink all your allowed liquids at once. Space your liquids out through the day.  Use small glasses and cups and sip slowly. If allowed, take your medications with fluids you eat or drink during a meal.    Fluid-Restricted Nutrition Therapy Sample 1-Day Menu  Breakfast 1 slice wheat toast  1 tablespoon peanut butter  1/2 cup yogurt (120 milliliters)  1/2 cup blueberries  1 cup milk (240 milliliters)   Lunch 3 ounces sliced turkey  2 slices whole wheat bread  1/2 cup lettuce for sandwich  2 slices tomato for sandwich  1 ounce reduced-fat, reduced-sodium cheese  1/2 cup fresh carrot sticks  1 banana  1 cup unsweetened tea (240 milliliters)   Evening Meal 8 ounces soup (240 milliliters)  3 ounces salmon  1/2 cup quinoa  1 cup green beans  1 cup mixed greens salad  1 tablespoon olive oil  1 cup coffee (240 milliliters)  Evening Snack 1/2 cup sliced peaches  1/2 cup frozen yogurt (120 milliliters)  1 cup water (240 milliliters)  Copyright 2020  Academy of Nutrition and Dietetics. All rights reserved

## 2024-04-10 NOTE — Telephone Encounter (Signed)
 Pharmacy Patient Advocate Encounter  Insurance verification completed.    The patient is insured through [employer]. Patient has Medicare and is not eligible for a copay card, but may be able to apply for patient assistance or Medicare RX Payment Plan (Patient Must reach out to their plan, if eligible for payment plan), if available.    Ran test claim for Eliquis  5mg  tablet and the current 30 day co-pay is $40.   This test claim was processed through [facility]- copay amounts may vary at other pharmacies due to [employer], or as the patient moves through the different stages of their insurance plan.

## 2024-04-10 NOTE — Evaluation (Signed)
 Occupational Therapy Evaluation Patient Details Name: Dana Snow MRN: [id number] DOB: 1946/03/15 Today's Date: 04/10/2024   History of Present Illness   Pt is a 79 year old female with a past medical history of stage IV invasive mammary carcinoma, SVC syndrome, cardiomyopathy with pericardial effusion, DVT hypertension who presented to the emergency department with acute hypoxic respiratory failure as well as shortness of breath with minimal exertion.     Clinical Impressions Pt was seen for OT evaluation this date. Prior to hospital admission, pt was mod indep with ADL and ambulation with AD. Currently in a multi level home with full flight to bed/bath. Pt and spouse will be moving into a 1 story home in [place] at the end of March.  Pt presents with deficits in activity tolerance, strength, and acute OT needs limiting their ability to perform ADL management at baseline level. Pt currently requires supervision for safety with exertional activities and monitoring for SpO2. Pt/daughter edu in ECS to support gradual return to routines, minimizing falls. Pt would benefit from skilled OT services while hospitalized to address noted impairments and functional limitations (see below for any additional details) in order to maximize safety and independence while minimizing future risk of falls, injury, and readmission. Do not anticipate the need for follow up OT services upon acute hospital DC.    If plan is discharge home, recommend the following:   Assistance with cooking/housework;Assist for transportation;Help with stairs or ramp for entrance     Functional Status Assessment   Patient has had a recent decline in their functional status and demonstrates the ability to make significant improvements in function in a reasonable and predictable amount of time.     Equipment Recommendations   None recommended by OT     Recommendations for Other Services          Precautions/Restrictions   Precautions Precautions: Fall Recall of Precautions/Restrictions: Intact Precaution/Restrictions Comments: hx R foot drop, does not have brace Restrictions Weight Bearing Restrictions Per Provider Order: No     Mobility Bed Mobility Overal bed mobility: Modified Independent                  Transfers Overall transfer level: Needs assistance Equipment used: Rolling walker (2 wheels) Transfers: Sit to/from Stand Sit to Stand: Supervision                  Balance Overall balance assessment: Needs assistance Sitting-balance support: No upper extremity supported, Feet supported Sitting balance-Leahy Scale: Fair     Standing balance support: Bilateral upper extremity supported, During functional activity Standing balance-Leahy Scale: Fair       ADL either performed or assessed with clinical judgement   ADL          General ADL Comments: Pt currently requires supervision for safety with toileting, bathing, and dressing      Pertinent Vitals/Pain Pain Assessment Pain Assessment: No/denies pain     Extremity/Trunk Assessment Upper Extremity Assessment Upper Extremity Assessment: Generalized weakness   Lower Extremity Assessment Lower Extremity Assessment: Defer to PT evaluation;RLE deficits/detail RLE Deficits / Details: hx drop foot, has brace       Communication Communication Communication: No apparent difficulties   Cognition Arousal: Alert Behavior During Therapy: WFL for tasks assessed/performed Cognition: No apparent impairments      Following commands: Intact       Cueing  General Comments   Cueing Techniques: Verbal cues      Exercises Other Exercises Other Exercises: Pt/daughter edu in ECS to  support gradual return to routines, minimizing falls   Shoulder Instructions      Home Living Family/patient expects to be discharged to:: Private residence Living Arrangements: Spouse/significant  other Available Help at Discharge: Family;Available PRN/intermittently Type of Home: House Home Access: Stairs to enter [employer] of Steps: 5 Entrance Stairs-Rails: Right;Left Home Layout: Two level Alternate Level Stairs-Number of Steps: 16 steps; 1 rail   Bathroom Shower/Tub: Producer, Television/film/video: Standard     Home Equipment: Agricultural Consultant (2 wheels);Shower seat - built in;Hand Dispensing Optician (4 wheels);Grab bars - tub/shower   Additional Comments: no bedroom on main level, but pt is willing to sleep on couch to stay on 1st level. Multiple entrances into the house with stairs, all stairs in house have at least 1 rail. Rollator on main floor, 2WW on 2nd floor, sunken den.      Prior Functioning/Environment Prior Level of Function : Independent/Modified Independent;Needs assist;History of Falls (last six months)             Mobility Comments: mod indep with AD for mobility, had started HHPT recently, HHOT finished ADLs Comments: mod indep with ADL, housekeeper, paid yard services; will have a 1 story home at [facility] [place] home    OT Problem List: Decreased activity tolerance;Decreased strength   OT Treatment/Interventions: Self-care/ADL training;Therapeutic exercise;Therapeutic activities;Energy conservation;DME and/or AE instruction;Patient/family education      OT Goals(Current goals can be found in the care plan section)   Acute Rehab OT Goals Patient Stated Goal: go home OT Goal Formulation: With patient/family Time For Goal Achievement: 04/24/24 Potential to Achieve Goals: Good ADL Goals Additional ADL Goal #1: Pt will complete all aspects of dressing, toileting, and grooming as part of morning routine wiht mod indep, utilizing learned ECS without need for cues, 2/2 opportunities. Additional ADL Goal #2: Pt will verbalize plan to implement at least 1 learned ECS to support safety and indep .   OT Frequency:  Min 1X/week     Co-evaluation PT/OT/SLP Co-Evaluation/Treatment: Yes Reason for Co-Treatment:  (tolerance) PT goals addressed during session: Mobility/safety with mobility OT goals addressed during session: ADL's and self-care      AM-PAC OT 6 Clicks Daily Activity     Outcome Measure Help from another person eating meals?: None Help from another person taking care of personal grooming?: None Help from another person toileting, which includes using toliet, bedpan, or urinal?: A Little Help from another person bathing (including washing, rinsing, drying)?: A Little Help from another person to put on and taking off regular upper body clothing?: None Help from another person to put on and taking off regular lower body clothing?: A Little 6 Click Score: 21   End of Session Equipment Utilized During Treatment: Oxygen;Rolling walker (2 wheels) Nurse Communication: Mobility status  Activity Tolerance: Patient tolerated treatment well Patient left: in chair;with call bell/phone within reach;with chair alarm set;with family/visitor present  OT Visit Diagnosis: Other abnormalities of gait and mobility (R26.89);Muscle weakness (generalized) (M62.81)                Time: 9043-8985 OT Time Calculation (min): 18 min Charges:  OT General Charges $OT Visit: 1 Visit OT Evaluation $OT Eval Low Complexity: 1 Low  Warren SAUNDERS., MPH, MS, OTR/L ascom [phone] 04/10/2024, 11:26 AM

## 2024-04-10 NOTE — Care Management CC44 (Signed)
"         Condition Code 44 Documentation Completed  Patient Details  Name: Dana Snow MRN: [id number] Date of Birth: [birth year]   Condition Code 44 given:  Yes Patient signature on Condition Code 44 notice:  Yes (Verbally via phone) Documentation of 2 MD's agreement:  Yes Code 44 added to claim:  Yes    Merilee LOISE Batty, RN 04/10/2024, 6:05 PM  "

## 2024-04-10 NOTE — Plan of Care (Signed)

## 2024-04-10 NOTE — Evaluation (Signed)
 Physical Therapy Evaluation Patient Details Name: Dana Snow MRN: [id number] DOB: 1946-03-16 Today's Date: 04/10/2024  History of Present Illness  Pt is a 79 year old female with a past medical history of stage IV invasive mammary carcinoma, SVC syndrome, cardiomyopathy with pericardial effusion, DVT hypertension who presented to the emergency department with acute hypoxic respiratory failure as well as shortness of breath with minimal exertion.   Clinical Impression  Pt A&Ox4, family at bedside. Confirmed pt is usually modI for mobility and ADLs, ambulatory in/out of home with rollator, does use a RW in home as well depending on 1st or second story. Pt stated she had just attempted mobility without oxygen, and she did desaturate, so 2L donned throughout session. Supine to sit with supervision, as well as sit <> Stand with RW and supervision as well. CGA ~70ft with RW, slow cautious gait, appeared to have increased work of breathing, pt reported mild fatigue at end of session. (Pt also has chronic R foot drop, brace not in room).  Overall the patient demonstrated deficits (see PT Problem List) that impede the patient's functional abilities, safety, and mobility and would benefit from skilled PT intervention.          If plan is discharge home, recommend the following: A little help with bathing/dressing/bathroom;Assistance with cooking/housework;Assistance with feeding;Help with stairs or ramp for entrance   Can travel by private vehicle        Equipment Recommendations None recommended by PT  Recommendations for Other Services       Functional Status Assessment Patient has had a recent decline in their functional status and demonstrates the ability to make significant improvements in function in a reasonable and predictable amount of time.     Precautions / Restrictions Precautions Precautions: Fall Recall of Precautions/Restrictions: Intact Precaution/Restrictions Comments: hx  R foot drop, does not have brace in room Restrictions Weight Bearing Restrictions Per Provider Order: No      Mobility  Bed Mobility Overal bed mobility: Modified Independent                  Transfers Overall transfer level: Needs assistance Equipment used: Rolling walker (2 wheels) Transfers: Sit to/from Stand Sit to Stand: Supervision                Ambulation/Gait Ambulation/Gait assistance: Contact guard assist Gait Distance (Feet): 55 Feet Assistive device: Rolling walker (2 wheels)   Gait velocity: decreased     General Gait Details: steppage gait due to chronir R foot drop. slow, cautious gait, somewhat SOB with activity  Stairs            Wheelchair Mobility     Tilt Bed    Modified Rankin (Stroke Patients Only)       Balance Overall balance assessment: Needs assistance Sitting-balance support: No upper extremity supported, Feet supported Sitting balance-Leahy Scale: Fair     Standing balance support: Bilateral upper extremity supported, During functional activity Standing balance-Leahy Scale: Fair                               Pertinent Vitals/Pain Pain Assessment Pain Assessment: No/denies pain    Home Living Family/patient expects to be discharged to:: Private residence Living Arrangements: Spouse/significant other Available Help at Discharge: Family;Available PRN/intermittently Type of Home: House Home Access: Stairs to enter Entrance Stairs-Rails: Right;Left Entrance Stairs-Number of Steps: 5 Alternate Level Stairs-Number of Steps: 16 steps; 1 rail Home Layout: Two level Home  Equipment: Agricultural Consultant (2 wheels);Shower seat - built in;Hand Dispensing Optician (4 wheels);Grab bars - tub/shower Additional Comments: no bedroom on main level, but pt is willing to sleep on couch to stay on 1st level. Multiple entrances into the house with stairs, all stairs in house have at least 1 rail. Rollator on main  floor, 2WW on 2nd floor, sunken den.    Prior Function Prior Level of Function : Independent/Modified Independent;Needs assist;History of Falls (last six months)             Mobility Comments: mod indep with AD for mobility, had started HHPT recently, HHOT finished ADLs Comments: mod indep with ADL, housekeeper, paid yard services; will have a 1 story home at [facility] [place] home     Extremity/Trunk Assessment   Upper Extremity Assessment Upper Extremity Assessment: Generalized weakness    Lower Extremity Assessment Lower Extremity Assessment: Generalized weakness RLE Deficits / Details: hx drop foot, has brace at baseline but not at hospital currently       Communication   Communication Communication: No apparent difficulties    Cognition Arousal: Alert Behavior During Therapy: [facility] for tasks assessed/performed                             Following commands: Intact       Cueing Cueing Techniques: Verbal cues     General Comments      Exercises     Assessment/Plan    PT Assessment Patient needs continued PT services  PT Problem List Decreased strength;Decreased balance;Decreased activity tolerance;Decreased mobility       PT Treatment Interventions DME instruction;Balance training;Gait training;Neuromuscular re-education;Stair training;Functional mobility training;Patient/family education;Therapeutic activities;Therapeutic exercise    PT Goals (Current goals can be found in the Care Plan section)  Acute Rehab PT Goals Patient Stated Goal: to return to PLOF PT Goal Formulation: With patient Time For Goal Achievement: 04/24/24 Potential to Achieve Goals: Good    Frequency Min 2X/week     Co-evaluation PT/OT/SLP Co-Evaluation/Treatment: Yes Reason for Co-Treatment:  (tolerance) PT goals addressed during session: Mobility/safety with mobility OT goals addressed during session: ADL's and self-care       AM-PAC PT 6 Clicks Mobility   Outcome Measure Help needed turning from your back to your side while in a flat bed without using bedrails?: None Help needed moving from lying on your back to sitting on the side of a flat bed without using bedrails?: None Help needed moving to and from a bed to a chair (including a wheelchair)?: None Help needed standing up from a chair using your arms (e.g., wheelchair or bedside chair)?: None Help needed to walk in hospital room?: A Little Help needed climbing 3-5 steps with a railing? : A Little 6 Click Score: 22    End of Session Equipment Utilized During Treatment: Oxygen Activity Tolerance: Patient tolerated treatment well Patient left: in chair;with chair alarm set;with call bell/phone within reach;with family/visitor present Nurse Communication: Mobility status PT Visit Diagnosis: Other abnormalities of gait and mobility (R26.89);Difficulty in walking, not elsewhere classified (R26.2);Muscle weakness (generalized) (M62.81)    Time: 9044-8986 PT Time Calculation (min) (ACUTE ONLY): 18 min   Charges:   PT Evaluation $PT Eval Low Complexity: 1 Low PT Treatments $Therapeutic Activity: 8-22 mins PT General Charges $$ ACUTE PT VISIT: 1 Visit         Doyal Shams PT, DPT 11:55 AM,04/10/2024

## 2024-04-10 NOTE — Hospital Course (Signed)
 Fri sat, weak inc sob.  Chornic dyspnea since 06/2023 when diagnosed with breast cancer.   Only with walking and sometimes with talking. Never on oxygen.  Got radiation but symptoms did not improve.   Worsening of weakness, increased shortness of breath, couldn't get down stairs could not get off cough. Feels like legs were weka.    Intermittent difficulty with swallowing, last week getting worse. Ate every this morning and no problems with swallowing.   Intermittent coughing since April.  No prod cough.  Nasal congestion. Ongoing for several months.    Very weak yesterday in both legs, dyspnea. Heart racing.

## 2024-04-10 NOTE — Progress Notes (Signed)
 TOC Brief note  Patient admitted with CHF, Acute Resp Failure and COPD, requiring 2 L [place]. Receiving duonedbs, IV steroids, and Lasix  IV. Note to have a Pericardial Effussion, plan to repeat echo, consult card, and continue to discuss risk/benefits of apixaban . No TOC Needs.  The medical record has been reviewed. The patient is from home. The patient has a payor and PCP on record. No SDOH interventions needed.   No recs at this time, please outreach to [facility] if needs are identified.

## 2024-04-10 NOTE — Progress Notes (Signed)
*  PRELIMINARY RESULTS* Echocardiogram 2D Echocardiogram has been performed.  Dana Snow 04/10/2024, 8:37 AM

## 2024-04-10 NOTE — TOC Progression Note (Signed)
 Transition of Care [facility]) - Progression Note    Patient Details  Name: Dana Snow MRN: [id number] Date of Birth: Sep 16, 1945  Transition of [facility] CM/SW Contact  Dalia GORMAN Fuse, RN Phone Number: 04/10/2024, 4:14 PM  Clinical Narrative:    Orders received for home 02. Referral sent to [facility] with adapt, toc advised the patient needs the O2 today.                      Expected Discharge Plan and Services                                               Social Drivers of Health (SDOH) Interventions SDOH Screenings   Food Insecurity: No Food Insecurity (04/09/2024)  Housing: Low Risk (04/09/2024)  Transportation Needs: No Transportation Needs (04/09/2024)  Utilities: Not At Risk (04/09/2024)  Depression (PHQ2-9): Low Risk (03/08/2024)  Recent Concern: Depression (PHQ2-9) - Medium Risk (01/20/2024)  Financial Resource Strain: Low Risk  (03/15/2024)   Received from [facility]  Social Connections: Socially Integrated (04/09/2024)  Tobacco Use: Medium Risk (04/09/2024)    Readmission Risk Interventions     No data to display

## 2024-04-10 NOTE — Telephone Encounter (Signed)
 Please cancel patient's lab appointment this week; keep MD appointment as planned

## 2024-04-10 NOTE — Progress Notes (Signed)
 Prior-To-Admission Oral Chemotherapy for Treatment of Oncologic Disease   Order noted from Dr. Prentice Core to continue prior-to-admission oral chemotherapy regimen of ribociclib  (Kisqali ).  Procedure Per Pharmacy & Therapeutics Committee Policy: Orders for continuation of home oral chemotherapy for treatment of an oncologic disease will be held unless approved by an oncologist during current admission.    For patients receiving oncology care at [facility], inpatient pharmacist contacts patient's oncologist during regular office hours to review. If earlier review is medically necessary, attending physician consults [facility] on-call oncologist   For patients receiving oncology care outside of [facility], attending physician consults patient's oncologist to review. If this oncologist or their coverage cannot be reached, attending physician consults [facility] on-call oncologist   Oral chemotherapy continuation order is on hold pending oncologist review, [facility] oncologist Dr. Dorethia will be notified by inpatient pharmacy during office hours   04/10/24:  Dr. Rennie contacted vis secure chat message. Per MD: ok to hold ribociclib  while in hospital- she can continue femara     Allean Haas PharmD Clinical Pharmacist 04/10/2024

## 2024-04-11 ENCOUNTER — Other Ambulatory Visit (HOSPITAL_COMMUNITY): Payer: Self-pay

## 2024-04-11 LAB — CALCIUM, IONIZED: Calcium, Ionized, Serum: 5.2 mg/dL (ref 4.5–5.6)

## 2024-04-11 LAB — CANCER ANTIGEN 15-3: CA 15-3: 39.3 U/mL — ABNORMAL HIGH (ref 0.0–25.0)

## 2024-04-11 LAB — CANCER ANTIGEN 27.29: CA 27.29: 50.2 U/mL — ABNORMAL HIGH (ref 0.0–38.6)

## 2024-04-11 LAB — CEA: CEA: 3.7 ng/mL (ref 0.0–4.7)

## 2024-04-12 ENCOUNTER — Inpatient Hospital Stay: Attending: Internal Medicine | Admitting: Internal Medicine

## 2024-04-12 ENCOUNTER — Inpatient Hospital Stay

## 2024-04-12 ENCOUNTER — Encounter: Payer: Self-pay | Admitting: Internal Medicine

## 2024-04-12 VITALS — BP 125/62 | HR 75 | Temp 97.6°F | Resp 16 | Ht 62.0 in | Wt 115.0 lb

## 2024-04-12 DIAGNOSIS — R451 Restlessness and agitation: Secondary | ICD-10-CM | POA: Insufficient documentation

## 2024-04-12 DIAGNOSIS — I2510 Atherosclerotic heart disease of native coronary artery without angina pectoris: Secondary | ICD-10-CM | POA: Insufficient documentation

## 2024-04-12 DIAGNOSIS — Z1721 Progesterone receptor positive status: Secondary | ICD-10-CM | POA: Insufficient documentation

## 2024-04-12 DIAGNOSIS — Z923 Personal history of irradiation: Secondary | ICD-10-CM | POA: Insufficient documentation

## 2024-04-12 DIAGNOSIS — Z87891 Personal history of nicotine dependence: Secondary | ICD-10-CM | POA: Insufficient documentation

## 2024-04-12 DIAGNOSIS — M47812 Spondylosis without myelopathy or radiculopathy, cervical region: Secondary | ICD-10-CM | POA: Insufficient documentation

## 2024-04-12 DIAGNOSIS — Z8616 Personal history of COVID-19: Secondary | ICD-10-CM | POA: Insufficient documentation

## 2024-04-12 DIAGNOSIS — I871 Compression of vein: Secondary | ICD-10-CM | POA: Insufficient documentation

## 2024-04-12 DIAGNOSIS — M47814 Spondylosis without myelopathy or radiculopathy, thoracic region: Secondary | ICD-10-CM | POA: Insufficient documentation

## 2024-04-12 DIAGNOSIS — J432 Centrilobular emphysema: Secondary | ICD-10-CM | POA: Insufficient documentation

## 2024-04-12 DIAGNOSIS — R634 Abnormal weight loss: Secondary | ICD-10-CM | POA: Insufficient documentation

## 2024-04-12 DIAGNOSIS — Z170 Estrogen receptor positive status [ER+]: Secondary | ICD-10-CM | POA: Diagnosis not present

## 2024-04-12 DIAGNOSIS — I119 Hypertensive heart disease without heart failure: Secondary | ICD-10-CM | POA: Insufficient documentation

## 2024-04-12 DIAGNOSIS — G4700 Insomnia, unspecified: Secondary | ICD-10-CM | POA: Insufficient documentation

## 2024-04-12 DIAGNOSIS — J90 Pleural effusion, not elsewhere classified: Secondary | ICD-10-CM | POA: Insufficient documentation

## 2024-04-12 DIAGNOSIS — Z79899 Other long term (current) drug therapy: Secondary | ICD-10-CM | POA: Insufficient documentation

## 2024-04-12 DIAGNOSIS — B349 Viral infection, unspecified: Secondary | ICD-10-CM | POA: Insufficient documentation

## 2024-04-12 DIAGNOSIS — R531 Weakness: Secondary | ICD-10-CM | POA: Insufficient documentation

## 2024-04-12 DIAGNOSIS — Z1732 Human epidermal growth factor receptor 2 negative status: Secondary | ICD-10-CM | POA: Insufficient documentation

## 2024-04-12 DIAGNOSIS — I3139 Other pericardial effusion (noninflammatory): Secondary | ICD-10-CM | POA: Insufficient documentation

## 2024-04-12 DIAGNOSIS — Z90710 Acquired absence of both cervix and uterus: Secondary | ICD-10-CM | POA: Insufficient documentation

## 2024-04-12 DIAGNOSIS — Z7901 Long term (current) use of anticoagulants: Secondary | ICD-10-CM | POA: Insufficient documentation

## 2024-04-12 DIAGNOSIS — Z79811 Long term (current) use of aromatase inhibitors: Secondary | ICD-10-CM | POA: Insufficient documentation

## 2024-04-12 DIAGNOSIS — Z9012 Acquired absence of left breast and nipple: Secondary | ICD-10-CM | POA: Insufficient documentation

## 2024-04-12 DIAGNOSIS — C7951 Secondary malignant neoplasm of bone: Secondary | ICD-10-CM | POA: Insufficient documentation

## 2024-04-12 DIAGNOSIS — Z885 Allergy status to narcotic agent status: Secondary | ICD-10-CM | POA: Insufficient documentation

## 2024-04-12 DIAGNOSIS — M8580 Other specified disorders of bone density and structure, unspecified site: Secondary | ICD-10-CM | POA: Insufficient documentation

## 2024-04-12 DIAGNOSIS — Z880 Allergy status to penicillin: Secondary | ICD-10-CM | POA: Insufficient documentation

## 2024-04-12 DIAGNOSIS — I700 Atherosclerosis of aorta: Secondary | ICD-10-CM | POA: Insufficient documentation

## 2024-04-12 DIAGNOSIS — C50312 Malignant neoplasm of lower-inner quadrant of left female breast: Secondary | ICD-10-CM | POA: Diagnosis not present

## 2024-04-12 DIAGNOSIS — J9811 Atelectasis: Secondary | ICD-10-CM | POA: Insufficient documentation

## 2024-04-12 DIAGNOSIS — R5383 Other fatigue: Secondary | ICD-10-CM | POA: Insufficient documentation

## 2024-04-12 MED ORDER — TRAZODONE HCL 50 MG PO TABS: 50.0000 mg | ORAL_TABLET | Freq: Every evening | ORAL | 1 refills | Status: AC | PRN

## 2024-04-12 MED ORDER — ALBUTEROL SULFATE HFA 108 (90 BASE) MCG/ACT IN AERS: 2.0000 | INHALATION_SPRAY | Freq: Four times a day (QID) | RESPIRATORY_TRACT | 2 refills | Status: AC | PRN

## 2024-04-12 NOTE — Progress Notes (Signed)
 Patient has some concerns and will let MD know in the room.

## 2024-04-12 NOTE — Assessment & Plan Note (Addendum)
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 PET scan- JULY 2025- Confluent abnormal soft tissue along the upper mediastinum. More towards the right side with involvement of the right lung hilum and separate tissue or nodes thoracic inlet on the right side adjacent to the right clavicle corresponding to the finding by prior CT scan. here are 4 hypermetabolic small bony lesions identified including thoracic spine at T1 and T2, the right acetabulum and left scapula worrisome for bony metastatic disease. Awaiting on NGS. SEP 2025-  Currently on on  letrozole  + Ribociclib .   # DEC 5th CT scan- chest:  Relatively similar appearance of infiltrative soft tissue involving the eccentric right superior mediastinum and low right neck, most consistent with metastatic disease. Marked narrowing of the SVC again identified. No acute thrombus; New small bilateral pleural effusions; Eccentric left C7 metastasis demonstrates new sclerosis, favoring interval healing.   # Continue letrozole  plus Ribociclib .  Tolerating fairly well.  Labs within normal limits.  # JAN 2026-acute respiratory failure- sec to viral infection- continue mucinex; continue O2 2 lits- [place] - called in albutreol prn;   # Foot drop, right greater than left-no major clinical concerns for CNS metastasis-MRI brain negative for any metastasis. stable.     # SVC syndrome-/ anisocoria-  - s/p RT-  9/29.Dr. Marea.--Noted to have hoarseness of voice and also chest congestion-recommend restarting torsemide  half a pill a day [10 mg a day] monitor closely for blood pressure changes and also dehydration. DEC 2025- MRI Brain- NEG for any stroke.   # OCT 30th, 2025- Positive for DVT in the left upper extremity- on Eliquis  5 mg BID- stable.   # Hypokalemia- recommend dietary supplementation.   # Insomnia- trial of trazadone prn.    # IV accesss:  difficult with larger needles -patient reluctant with port.    # DISPOSITION: # follow up in 5  weeks- MD; labs- cbc/cmp;Ca 27-29; Ca 15-3 - mag Dr.B

## 2024-04-12 NOTE — Progress Notes (Signed)
 [facility] CONSULT NOTE  Patient Care Team: Marikay Eva POUR, [name] as PCP - General (Physician Assistant) Rennie Cindy SAUNDERS, MD as Consulting Physician (Oncology)  CHIEF COMPLAINTS/PURPOSE OF CONSULTATION: recurrent breast cancer/ SVC syndrome  Oncology History Overview Note  #DEC 2015- LEFT  BREAST CANCER STAGE IIA [Mixed Ductal +Lobular Ca; T=2 (3.8CM); psN=0; Dr. Smith]]  ER/PR- >90%; Her 2 Nue- NEG; Lumpec & SLNBx Oncotype-RS=19 No Chemo; s/p RT [Dr.Crystal] April 2016- Arimidex; Stopped Oct 2016; START OCT 2016- Femara ; Sep 19th stopped sec to [place]; sep 19th 2019- START AROMASIN ; stop summer 2021.  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 PET scan- JULY 2025- Confluent abnormal soft tissue along the upper mediastinum. More towards the right side with involvement of the right lung hilum and separate tissue or nodes thoracic inlet on the right side adjacent to the right clavicle corresponding to the finding by prior CT scan. here are 4 hypermetabolic small bony lesions identified including thoracic spine at T1 and T2, the right acetabulum and left scapula worrisome for bony metastatic disease. A thoracic spine MRI may be of some benefit to further characterize the spine lesion. Two small nonspecific areas of asymmetric uptake along skeletal muscle of the left gluteus medius muscle and the left pectoralis muscle.   NGS-pending.   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   # BMD- Osteopenia [may 2016]; AUG 2018- T score= -1.1 [improved] ------------------------------------------------------------   DIAGNOSIS: BREAST CANCER    CURRENT/MOST RECENT THERAPY: Surveillance.    Carcinoma of lower-inner quadrant of left breast in female, estrogen receptor positive  (HCC)  04/12/2020 Cancer Staging   Staging form: Breast, AJCC 8th Edition - Clinical: Stage IB (cT2, cN0, cM0, G2, ER+, PR+, HER2-) - Signed by Camdin Hegner R, MD on 04/12/2020     HISTORY OF PRESENTING ILLNESS: Patient ambulating-independently. Accompanied by oldest daughter.   Dana Snow 79 y.o.  female pleasant patient with recurrent breast cancer-stage IV-ER pos;her 2 NEG- with SVC syndrome on Letrozole  + Kisqali  is here for a follow up.    Discussed the use of AI scribe software for clinical note transcription with the patient, who gave verbal consent to proceed.  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  She is currently receiving letrozole  and Kisqali  for metastatic ER+ lobular breast cancer, following a regimen of three weeks on and one week off. She has been off Kisqali  for four days due to recent hospitalization for acute viral respiratory infection, during which she noted increased energy and improved overall well-being. She has one sheet of Kisqali  remaining and is prepared to resume therapy. Tumor markers have decreased from 60-70 in August to 39 currently. She denies abnormal bleeding or bruising and reports improved oral intake.  She was hospitalized for acute onset of dyspnea, tachypnea, tachycardia, and lower extremity weakness with shaking and inability to ambulate, following a suspected viral infection. She did not require supplemental oxygen prior to this episode. Currently, she remains on home oxygen, with saturations dropping to 88% on ambulation without oxygen but remaining higher at rest. She notes significant improvement in respiratory symptoms compared  to her hospitalization and earlier in the day. She has developed increased mucus  production, with yellow and green sputum first expectorated this morning, and describes improvement in coughing and breathing. Prior to the acute episode, she experienced persistent throat congestion, morning cough, wheezing, and dysphagia, which have since resolved. She has used Mucinex DM with partial relief and restarted albuterol  inhaler yesterday, which provided more benefit than Mucinex. She also received inhaled treatments in the hospital, including a device believed to be Anoro, which she found helpful. She currently has an albuterol  inhaler with refills available.  She continues to experience sleep disturbances, including shallow breathing at night, inability to relax, and episodes of waking with dyspnea requiring her to sit up to control her breathing. Vivid dreams and nightmares occurred for a few days prior to the viral episode but have resolved. Melatonin was ineffective and caused increased agitation.  She notes persistent cold feet and has recently started using a brace for her right foot, which has provided significant relief.      Review of Systems  Constitutional:  Positive for malaise/fatigue and weight loss. Negative for chills, diaphoresis and fever.  HENT:  Negative for nosebleeds and sore throat.   Eyes:  Negative for double vision.  Respiratory:  Positive for cough and shortness of breath. Negative for hemoptysis, sputum production and wheezing.   Cardiovascular:  Negative for chest pain, palpitations, orthopnea and leg swelling.  Gastrointestinal:  Negative for abdominal pain, blood in stool, constipation, diarrhea, heartburn, melena, nausea and vomiting.  Genitourinary:  Negative for dysuria, frequency and urgency.  Musculoskeletal:  Negative for back pain and joint pain.  Skin: Negative.  Negative for itching and rash.  Neurological:  Positive for weakness. Negative for dizziness, tingling, focal weakness and headaches.  Endo/Heme/Allergies:  Does not bruise/bleed easily.   Psychiatric/Behavioral:  Negative for depression. The patient is not nervous/anxious and does not have insomnia.     MEDICAL HISTORY:  Past Medical History:  Diagnosis Date   Breast cancer (HCC) 2015   left lumpectomy 04/05/2014 and reexcision 04/27/2014 - radiation treatment   History of COVID-19    Hypertension    Personal history of radiation therapy 2016   Left breast    SURGICAL HISTORY: Past Surgical History:  Procedure Laterality Date   ABDOMINAL HYSTERECTOMY     BREAST BIOPSY Left 03/15/2014   8:00 us  bx, SCLEROSING ADENOSIS AND COLUMNAR CELL CHANGE    BREAST BIOPSY Left 02/27/2014   invasive lobular carcinoma LIQ   BREAST LUMPECTOMY Left 04/03/2014   [facility], DCIS, ALH, negative LN   BREAST LUMPECTOMY Left 04/27/2014   re-excision for clear margins   COMBINED HYSTEROSCOPY DIAGNOSTIC / D&C     PARTIAL MASTECTOMY WITH AXILLARY SENTINEL LYMPH NODE BIOPSY Left     SOCIAL HISTORY: Social History   Socioeconomic History   Marital status: Married    Spouse name: Not on file   Number of children: Not on file   Years of education: Not on file   Highest education level: Not on file  Occupational History   Not on file  Tobacco Use   Smoking status: Former    Current packs/day: 1.50    Types: Cigarettes   Smokeless tobacco: Never  Vaping Use   Vaping status: Never Used  Substance and Sexual Activity   Alcohol use: Yes    Alcohol/week: 2.0 standard drinks of alcohol    Types: 2 Glasses of wine per week   Drug use: Never   Sexual activity: Not  Currently  Other Topics Concern   Not on file  Social History Narrative   Not on file   Social Drivers of Health   Tobacco Use: Medium Risk (04/12/2024)   Patient History    Smoking Tobacco Use: Former    Smokeless Tobacco Use: Never    Passive Exposure: Not on file  Financial Resource Strain: Low Risk  (03/15/2024)   Received from [facility]   Overall Financial Resource Strain (CARDIA)    Difficulty  of Paying Living Expenses: Not hard at all  Food Insecurity: No Food Insecurity (04/09/2024)   Epic    Worried About Programme Researcher, Broadcasting/film/video in the Last Year: Never true    Ran Out of Food in the Last Year: Never true  Transportation Needs: No Transportation Needs (04/09/2024)   Epic    Lack of Transportation (Medical): No    Lack of Transportation (Non-Medical): No  Physical Activity: Not on file  Stress: Not on file  Social Connections: Socially Integrated (04/09/2024)   Social Connection and Isolation Panel    Frequency of Communication with Friends and Family: More than three times a week    Frequency of Social Gatherings with Friends and Family: More than three times a week    Attends Religious Services: More than 4 times per year    Active Member of [employer] or Organizations: Yes    Attends Banker Meetings: More than 4 times per year    Marital Status: Married  Catering Manager Violence: Not At Risk (04/09/2024)   Epic    Fear of Current or Ex-Partner: No    Emotionally Abused: No    Physically Abused: No    Sexually Abused: No  Depression (PHQ2-9): Low Risk (03/08/2024)   Depression (PHQ2-9)    PHQ-2 Score: 0  Recent Concern: Depression (PHQ2-9) - Medium Risk (01/20/2024)   Depression (PHQ2-9)    PHQ-2 Score: 7  Alcohol Screen: Not on file  Housing: Low Risk (04/09/2024)   Epic    Unable to Pay for Housing in the Last Year: No    Number of Times Moved in the Last Year: 0    Homeless in the Last Year: No  Utilities: Not At Risk (04/09/2024)   Epic    Threatened with loss of utilities: No  Health Literacy: Not on file    FAMILY HISTORY: Family History  Problem Relation Age of Onset   Breast cancer Neg Hx     ALLERGIES:  is allergic to percocet [oxycodone-acetaminophen ], codeine, codone [hydrocodone], oxycodone hcl, and penicillins.  MEDICATIONS:  Current Outpatient Medications  Medication Sig Dispense Refill   albuterol  (VENTOLIN  HFA) 108 (90 Base) MCG/ACT  inhaler Inhale 2 puffs into the lungs every 6 (six) hours as needed for wheezing or shortness of breath. 1 each 2   apixaban  (ELIQUIS ) 5 MG TABS tablet TAKE ONE TABLET BY MOUTH TWICE DAILY 60 tablet 0   dextromethorphan-guaiFENesin (MUCINEX DM) 30-600 MG 12hr tablet Take 1 tablet by mouth 2 (two) times daily.     feeding supplement (ENSURE PLUS HIGH PROTEIN) LIQD Take 237 mLs by mouth 2 (two) times daily between meals. 20000 mL 1   letrozole  (FEMARA ) 2.5 MG tablet TAKE 1 TABLET BY MOUTH DAILY. 90 tablet 1   magic mouthwash (nystatin, hydrocortisone, diphenhydrAMINE, lidocaine ) suspension 5 mLs 4 (four) times daily as needed for mouth pain.     metoprolol  tartrate (LOPRESSOR ) 25 MG tablet Take 25 mg by mouth 2 (two) times daily.  polyethylene glycol powder (MIRALAX ) 17 GM/SCOOP powder Take 17 g by mouth daily as needed for mild constipation or moderate constipation. Dissolve 1 capful (17g) in 4-8 ounces of liquid and take by mouth daily.     torsemide  (DEMADEX ) 10 MG tablet Take 2 tablets (20 mg total) by mouth daily.     traMADol  (ULTRAM ) 50 MG tablet Take 1 tablet (50 mg total) by mouth every 8 (eight) hours as needed (mild pain). 30 tablet 0   traZODone  (DESYREL ) 50 MG tablet Take 1 tablet (50 mg total) by mouth at bedtime as needed for sleep. 30 tablet 1   ondansetron  (ZOFRAN ) 8 MG tablet One pill every 8 hours as needed for nausea/vomitting. (Patient not taking: Reported on 04/12/2024) 40 tablet 1   [Paused] ribociclib  succ (KISQALI  200MG  DAILY DOSE) 200 MG Therapy Pack Take 1 tablet daily for 21 days on, 7 days off. Repeat every 28 days. (Patient not taking: Reported on 04/12/2024) 21 tablet 3   vitamin C (ASCORBIC ACID) 500 MG tablet Take 500 mg by mouth daily. (Patient not taking: Reported on 04/12/2024)     No current facility-administered medications for this visit.    PHYSICAL EXAMINATION:   Vitals:   04/12/24 1308  BP: 125/62  Pulse: 75  Resp: 16  Temp: 97.6 F (36.4 C)  SpO2:  92%   Filed Weights   04/12/24 1308  Weight: 115 lb (52.2 kg)   Facial puffiness noted.  Right pupil smaller- and drooping noted.   Venous engorgement on the chest noted.   Left upper extremity swollen compared to the right.  Bilateral foot drop noted right more than left.  Physical Exam Vitals and nursing note reviewed.  HENT:     Head: Normocephalic and atraumatic.     Mouth/Throat:     Pharynx: Oropharynx is clear.  Eyes:     Extraocular Movements: Extraocular movements intact.     Pupils: Pupils are equal, round, and reactive to light.  Cardiovascular:     Rate and Rhythm: Normal rate and regular rhythm.  Pulmonary:     Comments: Decreased breath sounds bilaterally.  Abdominal:     Palpations: Abdomen is soft.  Musculoskeletal:        General: Normal range of motion.     Cervical back: Normal range of motion.  Skin:    General: Skin is warm.  Neurological:     General: No focal deficit present.     Mental Status: She is alert and oriented to person, place, and time.  Psychiatric:        Behavior: Behavior normal.        Judgment: Judgment normal.     LABORATORY DATA:  I have reviewed the data as listed Lab Results  Component Value Date   WBC 6.5 04/10/2024   HGB 13.2 04/10/2024   HCT 39.7 04/10/2024   MCV 106.1 (H) 04/10/2024   PLT 195 04/10/2024   Recent Labs    02/07/24 0827 03/07/24 1246 04/09/24 0839 04/10/24 1230  NA 134* 144 140 136  K 4.3 3.2* 3.6 3.6  CL 101 100 101 96*  CO2 24 32 30 30  GLUCOSE 105* 125* 117* 174*  BUN 15 14 13 16   CREATININE 0.80 1.02* 0.84 0.86  CALCIUM 8.9 9.7 9.7 9.5  GFRNONAA >60 56* >60 >60  PROT 6.0* 6.9  --  6.8  ALBUMIN 3.1* 4.1  --  4.2  AST 32 39  --  22  ALT 29 56*  --  25  ALKPHOS 60 75  --  56  BILITOT 1.1 0.9  --  0.9    RADIOGRAPHIC STUDIES: I have personally reviewed the radiological images as listed and agreed with the findings in the report. ECHOCARDIOGRAM COMPLETE Result Date: 04/10/2024     ECHOCARDIOGRAM REPORT   Patient Name:   Dana Snow Date of Exam: 04/10/2024 Medical Rec #:  [id number]       Height:       62.0 in Accession #:    [id number]      Weight:       115.0 lb Date of Birth:  1945-09-14       BSA:          1.511 m Patient Age:    78 years        BP:           168/77 mmHg Patient Gender: F               HR:           82 bpm. Exam Location:  [facility] Procedure: 2D Echo, Cardiac Doppler and Color Doppler (Both Spectral and Color            Flow Doppler were utilized during procedure). Indications:     Pericardial Effusion I31.3  History:         Patient has no prior history of Echocardiogram examinations.                  Risk Factors:Hypertension.  Sonographer:     Christopher Furnace Referring Phys:  [id number] PRENTICE BROCKS CORE Diagnosing Phys: Cara JONETTA Lovelace MD IMPRESSIONS  1. Left ventricular ejection fraction, by estimation, is 70 to 75%. The left ventricle has hyperdynamic function. The left ventricle has no regional wall motion abnormalities. Left ventricular diastolic parameters are consistent with Grade I diastolic dysfunction (impaired relaxation).  2. Right ventricular systolic function is normal. The right ventricular size is normal. Mildly increased right ventricular wall thickness.  3. The mitral valve is normal in structure. Trivial mitral valve regurgitation.  4. The aortic valve is normal in structure. Aortic valve regurgitation is mild. Aortic valve sclerosis is present, with no evidence of aortic valve stenosis. FINDINGS  Left Ventricle: Left ventricular ejection fraction, by estimation, is 70 to 75%. The left ventricle has hyperdynamic function. The left ventricle has no regional wall motion abnormalities. Strain was performed and the global longitudinal strain is indeterminate. The left ventricular internal cavity size was normal in size. There is borderline asymmetric left ventricular hypertrophy. Left ventricular diastolic parameters are consistent with Grade I diastolic  dysfunction (impaired relaxation). Right Ventricle: The right ventricular size is normal. Mildly increased right ventricular wall thickness. Right ventricular systolic function is normal. Left Atrium: Left atrial size was normal in size. Right Atrium: Right atrial size was normal in size. Pericardium: Trivial pericardial effusion is present. Mitral Valve: The mitral valve is normal in structure. Trivial mitral valve regurgitation. MV peak gradient, 6.2 mmHg. The mean mitral valve gradient is 3.0 mmHg. Tricuspid Valve: The tricuspid valve is normal in structure. Tricuspid valve regurgitation is trivial. Aortic Valve: The aortic valve is normal in structure. Aortic valve regurgitation is mild. Aortic valve sclerosis is present, with no evidence of aortic valve stenosis. Aortic valve mean gradient measures 2.0 mmHg. Aortic valve peak gradient measures 4.1  mmHg. Aortic valve area, by VTI measures 3.87 cm. Pulmonic Valve: The pulmonic valve was normal in structure. Pulmonic valve regurgitation is not visualized. Aorta:  The ascending aorta was not well visualized. IAS/Shunts: No atrial level shunt detected by color flow Doppler. Additional Comments: 3D was performed not requiring image post processing on an independent workstation and was indeterminate.  LEFT VENTRICLE PLAX 2D LVIDd:         4.10 cm   Diastology LVIDs:         2.40 cm   LV e' medial:    5.22 cm/s LV PW:         1.20 cm   LV E/e' medial:  16.4 LV IVS:        1.00 cm   LV e' lateral:   5.66 cm/s LVOT diam:     2.00 cm   LV E/e' lateral: 15.1 LV SV:         65 LV SV Index:   43 LVOT Area:     3.14 cm LV IVRT:       100 msec  RIGHT VENTRICLE RV Basal diam:  2.30 cm RV Mid diam:    2.30 cm RV S prime:     14.80 cm/s TAPSE (M-mode): 2.0 cm LEFT ATRIUM             Index        RIGHT ATRIUM          Index LA diam:        2.10 cm 1.39 cm/m   RA Area:     9.69 cm LA Vol (A2C):   22.9 ml 15.16 ml/m  RA Volume:   19.90 ml 13.17 ml/m LA Vol (A4C):   18.4 ml  12.18 ml/m LA Biplane Vol: 20.8 ml 13.77 ml/m  AORTIC VALVE AV Area (Vmax):    3.55 cm AV Area (Vmean):   3.29 cm AV Area (VTI):     3.87 cm AV Vmax:           101.00 cm/s AV Vmean:          70.600 cm/s AV VTI:            0.169 m AV Peak Grad:      4.1 mmHg AV Mean Grad:      2.0 mmHg LVOT Vmax:         114.00 cm/s LVOT Vmean:        73.900 cm/s LVOT VTI:          0.208 m LVOT/AV VTI ratio: 1.23  AORTA Ao Root diam: 3.00 cm MITRAL VALVE                TRICUSPID VALVE MV Area (PHT): 5.31 cm     TR Peak grad:   5.2 mmHg MV Area VTI:   1.97 cm     TR Vmax:        114.00 cm/s MV Peak grad:  6.2 mmHg MV Mean grad:  3.0 mmHg     SHUNTS MV Vmax:       1.25 m/s     Systemic VTI:  0.21 m MV Vmean:      86.5 cm/s    Systemic Diam: 2.00 cm MV Decel Time: 143 msec MV E velocity: 85.40 cm/s MV A velocity: 127.00 cm/s MV E/A ratio:  0.67 Dwayne D Callwood MD Electronically signed by Cara JONETTA Lovelace MD Signature Date/Time: 04/10/2024/4:34:50 PM    Final    CT Angio Chest PE W and/or Wo Contrast Result Date: 04/09/2024 CLINICAL DATA:  Known DVT. Increased oxygen requirement, shortness of breath. Breast cancer * Tracking Code: BO *  EXAM: CT ANGIOGRAPHY CHEST WITH CONTRAST TECHNIQUE: Multidetector CT imaging of the chest was performed using the standard protocol during bolus administration of intravenous contrast. Multiplanar CT image reconstructions and MIPs were obtained to evaluate the vascular anatomy. RADIATION DOSE REDUCTION: This exam was performed according to the departmental dose-optimization program which includes automated exposure control, adjustment of the mA and/or kV according to patient size and/or use of iterative reconstruction technique. CONTRAST:  75mL OMNIPAQUE  IOHEXOL  350 MG/ML SOLN COMPARISON:  03/03/2024. FINDINGS: Cardiovascular: Negative for pulmonary embolus. Atherosclerotic calcification of the aorta and coronary arteries. Enlarged pulmonic trunk and heart. Small to moderate pericardial  effusion. Mediastinum/Nodes: Ill-defined soft tissue in the low right internal jugular station, superior mediastinum and prevascular space, grossly stable from 03/03/2024. Measurements are challenging without opacification of the involved vessels. No discrete mediastinal, hilar or axillary lymph nodes. Air in the esophagus can be seen with dysmotility. Lungs/Pleura: Image quality is degraded by expiratory phase imaging and respiratory motion. Centrilobular emphysema. Areas of volume loss in the right middle lobe, lingula and both lower lobes. Small bilateral pleural effusions, partially loculated on the left. Airway is unremarkable. Upper Abdomen: Visualized portions of the liver, gallbladder, right adrenal gland and right kidney are grossly unremarkable. Musculoskeletal: Degenerative changes in the spine. Scattered mixed lytic and sclerotic lesions predominating in the spine and manubrium. Review of the MIP images confirms the above findings. IMPRESSION: 1. Negative for pulmonary embolus. 2. Ill-defined soft tissue in the low right internal jugular station, superior mediastinum and prevascular space, previously characterized as metastatic disease, stable. Osseous metastatic disease, also stable. 3. Small to moderate pericardial effusion. 4. Small bilateral pleural effusions with atelectasis in the right middle lobe, lingula and both lower lobes. 5. Aortic atherosclerosis (ICD10-I70.0). Coronary artery calcification. 6. Enlarged pulmonic trunk, indicative of pulmonary arterial hypertension. 7.  Emphysema (ICD10-J43.9). Electronically Signed   By: Newell Eke M.D.   On: 04/09/2024 13:15   DG Chest 2 View Result Date: 04/09/2024 EXAM: 2 VIEW(S) XRAY OF THE CHEST 04/09/2024 09:07:00 AM COMPARISON: 01/03/2024 and CT chest 03/04/2023. CLINICAL HISTORY: 79 year old female with shortness of breath. Breast cancer . FINDINGS: LUNGS AND PLEURA: Lower lung volumes. Elevated right hemidiaphragm.increased pulmonary  vascularity diffusely . Centrilobular emphysema demonstrated on CTA last year. No pneumothorax. HEART AND MEDIASTINUM: No acute abnormality of the cardiac and mediastinal silhouettes. BONES AND SOFT TISSUES: No acute osseous abnormality. IMPRESSION: 1. Lower lung volumes. Increased pulmonary vascular congestion, consider milder developing interstitial edema. 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 PET scan- JULY 2025- Confluent abnormal soft tissue along the upper mediastinum. More towards the right side with involvement of the right lung hilum and separate tissue or nodes thoracic inlet on the right side adjacent to the right clavicle corresponding to the finding by prior CT scan. here are 4 hypermetabolic small bony lesions identified including thoracic spine at T1 and T2, the right acetabulum and left scapula worrisome for bony metastatic disease. Awaiting on NGS. SEP 2025-  Currently on on  letrozole  + Ribociclib .   # DEC 5th CT scan- chest:  Relatively similar appearance of infiltrative soft tissue involving the eccentric right superior mediastinum and low right neck, most consistent with metastatic disease. Marked narrowing  of the SVC again identified. No acute thrombus; New small bilateral pleural effusions; Eccentric left C7 metastasis demonstrates new sclerosis, favoring interval healing.   # Continue letrozole  plus Ribociclib .  Tolerating fairly well.  Labs within normal limits.  # JAN 2026-acute respiratory failure- sec to viral infection- continue mucinex; continue O2 2 lits- [place]   # Foot drop, right greater than left-no major clinical concerns for CNS metastasis-MRI brain  negative for any metastasis. stable.     # SVC syndrome-/ anisocoria-  - s/p RT-  9/29.Dr. Marea.--Noted to have hoarseness of voice and also chest congestion-recommend restarting torsemide  half a pill a day [10 mg a day] monitor closely for blood pressure changes and also dehydration. DEC 2025- MRI Brain- NEG for any stroke.   # OCT 30th, 2025- Positive for DVT in the left upper extremity- on Eliquis  5 mg BID- stable.   # Hypokalemia- recommend dietary supplementation.   # Insomnia- trial of trazadone prn.    # IV accesss: difficult with larger needles -patient reluctant with port.    # DISPOSITION: # follow up in 5  weeks- MD; labs- cbc/cmp;Ca 27-29; Ca 15-3 - mag Dr.B    Above plan of care was discussed with patient/family in detail.  My contact information was given to the patient/family.     Cindy JONELLE Joe, MD 04/12/2024 2:12 PM

## 2024-04-13 ENCOUNTER — Other Ambulatory Visit: Payer: Self-pay

## 2024-04-13 NOTE — Discharge Summary (Addendum)
 " Physician Discharge Summary   Patient: Dana Snow MRN: [id number] DOB: 1945-07-19  Admit date:     04/09/2024  Discharge date: 04/10/2024  Discharge Physician: Alban Pepper   PCP: Marikay Eva POUR, PA   Recommendations at discharge:    None  Discharge Diagnoses: Principal Problem:   Hypoxic respiratory failure (HCC) Active Problems:   COPD with acute exacerbation (HCC)   Breast cancer (HCC)   Essential hypertension   Pericardial effusion   Diastolic heart failure (HCC)   DVT (deep venous thrombosis) (HCC)   Acute hypoxemic respiratory failure (HCC)  Resolved Problems:   * No resolved hospital problems. [facility] Course: Per H&P HPI   Dana Snow is a 79 year old female with a past medical history of stage IV invasive mammary carcinoma, SVC syndrome, cardiomyopathy with pericardial effusion, hypertension who presented to the emergency department with acute hypoxic respiratory failure as well as shortness of breath with minimal exertion.  The patient denies any dependent edema but reports she has had orthopnea longstanding and reports she has had wheeze but no fever or chills and relates she has had increased work of breathing and shortness of breath with minimal exertion over the last 2 days.  No chest pain or syncopal symptoms. Edema of upper extremity better than baseline, no neck edema.   Case discussed with the emergency department provider and patient subsequently admitted into the hospital.   Past medical records reviewed and summarized: Primary care visit from March 15, 2024: Respiratory symptoms leading to impaired functional status Medical oncology note 02/07/2024: Breast CA Cardiology note from 12/30/2023: Moderate Pericardial Effusion circumferentially ant-RV>LV post EF >55% Fibrous stranding tissue along the RV ant wall 1-2 mm thickness Mild evidence of RV compromise Consider pericardiocentesis .  Plan  Pericardial effusion, no clear evidence of  tamponade, will consider pericardiocentesis if symptoms persist, preferably for a more conservative approach with diuretics   See below   Assessment and Plan: * Hypoxic respiratory failure (HCC)   Acute hypoxic respiratory failure Patient presented dyspnea and found to have O2 saturation of 82% on room air Cta chest was without PE, though patient noted to have emphysema.  TTE LVEF 70 to 75% G1DD RV nl sys fxn, no significant valvuloplasty. Trivial pericardial effusion. BNP elevated ~1100. A RPP was obtained to see if viral respiratory infection cause of patient's symptoms and was negative.    Patients symptoms likely multifactorial in etiology. She was treated for COPD exacerbation and acute diastolic HF. Cardiology was consulted to see if patient's pericardial effusion contributing to symptoms but this was noted to be trivial on TTE this hospitalization. She was started on IV diuretics she received two doses as well as inhalers, steroids, PRN nebs and cefepime .    Patient felt much better on the day of discharge. She had desaturation of 86% with movement on RA and required 2L of oxygen. She will have her oxygen weaned in the outpatient setting to maintain her O2 sats >90%. Torsemide  was prescribed and she will follow up with cardiology in 1 week. She wished to discuss further treatments with her oncologist.    COPD with acute exacerbation (HCC) Acute COPD exacerbation Patient with CT findings of advanced emphysema She was given inhalers per above as well as 3 doses of steroids. Patient wished to discuss further treatment of this with her outpatient oncologist.   Breast cancer [facility]) History noted.  Her home aromatase inhibitor was continued. Kisqali  was held per oncology (no evidence of pneumonitis on CT)  Ionized calcium WNL.   DVT (deep venous thrombosis) (HCC) Known history in setting of malignancy Also history of SVC syndrome Eliquis  was continued.   Diastolic heart failure  (HCC) Suspected acute diastolic heart failure Patient has brain atretic peptide 1107 and troponin of 23 no evidence of PE CT findings consistent with probable pulmonary hypertension. She was started on IV diuretic and discharged on torsemide  to follow up with her outpatient cardiology team.    Pericardial effusion  Pericardial effusion Unlikely to be the cause of her presenting symptoms given the chronicity Outpatient cardiology notes from October of last year recommended diuretics and note Moderate Pericardial Effusion circumferentially ant-RV>LV post CT today noted  Small to moderate pericardial effusion TTE with trivial pericardial effusion.  She was dischared on torsemide  and will follow up with cardiology outpatient.    Essential hypertension Continue home metoprolol  25 mg twice a day Further titration of antihypertensives outpatient.    Dysphagia Evaluated by speech who felt patients symptoms a result of reflux. Patient would like to discuss with her oncologist whether or not to start proton pump inhibitor.        Consultants: Oncology Procedures performed: None  Disposition: Home Diet recommendation:  Regular diet DISCHARGE MEDICATION: Allergies as of 04/10/2024       Reactions   Percocet [oxycodone-acetaminophen ] Other (See Comments)   Loopy    Codeine    Codone [hydrocodone] Other (See Comments)   hyperactive   Oxycodone Hcl    Penicillins Rash        Medication List     PAUSE taking these medications    Kisqali  (200 MG Dose) 200 MG Therapy Pack Wait to take this until your doctor or other care provider tells you to start again. Generic drug: ribociclib  succ Take 1 tablet daily for 21 days on, 7 days off. Repeat every 28 days.       TAKE these medications    ascorbic acid 500 MG tablet Commonly known as: VITAMIN C Take 500 mg by mouth daily.   dextromethorphan-guaiFENesin 30-600 MG 12hr tablet Commonly known as: MUCINEX DM Take 1 tablet by  mouth 2 (two) times daily.   Eliquis  5 MG Tabs tablet Generic drug: apixaban  TAKE ONE TABLET BY MOUTH TWICE DAILY   feeding supplement Liqd Take 237 mLs by mouth 2 (two) times daily between meals.   letrozole  2.5 MG tablet Commonly known as: FEMARA  TAKE 1 TABLET BY MOUTH DAILY.   magic mouthwash (nystatin, hydrocortisone, diphenhydrAMINE, lidocaine ) suspension 5 mLs 4 (four) times daily as needed for mouth pain.   metoprolol  tartrate 25 MG tablet Commonly known as: LOPRESSOR  Take 25 mg by mouth 2 (two) times daily.   ondansetron  8 MG tablet Commonly known as: ZOFRAN  One pill every 8 hours as needed for nausea/vomitting.   polyethylene glycol powder 17 GM/SCOOP powder Commonly known as: MiraLax  Take 17 g by mouth daily as needed for mild constipation or moderate constipation. Dissolve 1 capful (17g) in 4-8 ounces of liquid and take by mouth daily.   torsemide  10 MG tablet Commonly known as: DEMADEX  Take 2 tablets (20 mg total) by mouth daily. What changed: how much to take   traMADol  50 MG tablet Commonly known as: ULTRAM  Take 1 tablet (50 mg total) by mouth every 8 (eight) hours as needed (mild pain).        Follow-up Information     Marikay Eva POUR, PA Follow up.   Specialty: Physician Assistant Why: hospital follow up Contact information: [address] [facility] [place] [address] [phone]         Florencio Cara BIRCH, MD. Go in 1 week(s).   Specialties: Cardiology, Internal Medicine Why: Appointment scheduled for 04/18/24 at 10 AM Contact information: [address] [place] [address] [phone]                Discharge Exam: Fredricka Weights   04/09/24 0838  Weight: 52.2 kg   Physical Exam  Constitutional: In no distress.  Cardiovascular: Normal rate, regular rhythm. No lower extremity edema  Pulmonary: Non labored breathing on Elmo, no wheezing or rales.   Abdominal: Soft. Non distended and non  tender Musculoskeletal: Normal range of motion.     Neurological: Alert and oriented to person, place, and time. Non focal  Skin: Skin is warm and dry.    Condition at discharge: fair  The results of significant diagnostics from this hospitalization (including imaging, microbiology, ancillary and laboratory) are listed below for reference.   Imaging Studies: ECHOCARDIOGRAM COMPLETE Result Date: 04/10/2024    ECHOCARDIOGRAM REPORT   Patient Name:   Tasheka Fletchall Date of Exam: 04/10/2024 Medical Rec #:  [id number]       Height:       62.0 in Accession #:    [id number]      Weight:       115.0 lb Date of Birth:  1946/03/05       BSA:          1.511 m Patient Age:    78 years        BP:           168/77 mmHg Patient Gender: F               HR:           82 bpm. Exam Location:  [facility] Procedure: 2D Echo, Cardiac Doppler and Color Doppler (Both Spectral and Color            Flow Doppler were utilized during procedure). Indications:     Pericardial Effusion I31.3  History:         Patient has no prior history of Echocardiogram examinations.                  Risk Factors:Hypertension.  Sonographer:     Christopher Furnace Referring Phys:  [id number] PRENTICE BROCKS CORE Diagnosing Phys: Cara BIRCH Florencio MD IMPRESSIONS  1. Left ventricular ejection fraction, by estimation, is 70 to 75%. The left ventricle has hyperdynamic function. The left ventricle has no regional wall motion abnormalities. Left ventricular diastolic parameters are consistent with Grade I diastolic dysfunction (impaired relaxation).  2. Right ventricular systolic function is normal. The right ventricular size is normal. Mildly increased right ventricular wall thickness.  3. The mitral valve is normal in structure. Trivial mitral valve regurgitation.  4. The aortic valve is normal in structure. Aortic valve regurgitation is mild. Aortic valve sclerosis is present, with no evidence of aortic valve stenosis. FINDINGS  Left Ventricle: Left ventricular ejection  fraction, by estimation, is 70 to 75%. The left ventricle has hyperdynamic function. The left ventricle has no regional wall motion abnormalities. Strain was performed and the global longitudinal strain is indeterminate. The left ventricular internal cavity size was normal in size. There is borderline asymmetric left ventricular hypertrophy. Left ventricular diastolic parameters are consistent with Grade I diastolic dysfunction (impaired relaxation). Right Ventricle: The right ventricular size is normal. Mildly increased right ventricular wall thickness. Right  ventricular systolic function is normal. Left Atrium: Left atrial size was normal in size. Right Atrium: Right atrial size was normal in size. Pericardium: Trivial pericardial effusion is present. Mitral Valve: The mitral valve is normal in structure. Trivial mitral valve regurgitation. MV peak gradient, 6.2 mmHg. The mean mitral valve gradient is 3.0 mmHg. Tricuspid Valve: The tricuspid valve is normal in structure. Tricuspid valve regurgitation is trivial. Aortic Valve: The aortic valve is normal in structure. Aortic valve regurgitation is mild. Aortic valve sclerosis is present, with no evidence of aortic valve stenosis. Aortic valve mean gradient measures 2.0 mmHg. Aortic valve peak gradient measures 4.1  mmHg. Aortic valve area, by VTI measures 3.87 cm. Pulmonic Valve: The pulmonic valve was normal in structure. Pulmonic valve regurgitation is not visualized. Aorta: The ascending aorta was not well visualized. IAS/Shunts: No atrial level shunt detected by color flow Doppler. Additional Comments: 3D was performed not requiring image post processing on an independent workstation and was indeterminate.  LEFT VENTRICLE PLAX 2D LVIDd:         4.10 cm   Diastology LVIDs:         2.40 cm   LV e' medial:    5.22 cm/s LV PW:         1.20 cm   LV E/e' medial:  16.4 LV IVS:        1.00 cm   LV e' lateral:   5.66 cm/s LVOT diam:     2.00 cm   LV E/e' lateral: 15.1  LV SV:         65 LV SV Index:   43 LVOT Area:     3.14 cm LV IVRT:       100 msec  RIGHT VENTRICLE RV Basal diam:  2.30 cm RV Mid diam:    2.30 cm RV S prime:     14.80 cm/s TAPSE (M-mode): 2.0 cm LEFT ATRIUM             Index        RIGHT ATRIUM          Index LA diam:        2.10 cm 1.39 cm/m   RA Area:     9.69 cm LA Vol (A2C):   22.9 ml 15.16 ml/m  RA Volume:   19.90 ml 13.17 ml/m LA Vol (A4C):   18.4 ml 12.18 ml/m LA Biplane Vol: 20.8 ml 13.77 ml/m  AORTIC VALVE AV Area (Vmax):    3.55 cm AV Area (Vmean):   3.29 cm AV Area (VTI):     3.87 cm AV Vmax:           101.00 cm/s AV Vmean:          70.600 cm/s AV VTI:            0.169 m AV Peak Grad:      4.1 mmHg AV Mean Grad:      2.0 mmHg LVOT Vmax:         114.00 cm/s LVOT Vmean:        73.900 cm/s LVOT VTI:          0.208 m LVOT/AV VTI ratio: 1.23  AORTA Ao Root diam: 3.00 cm MITRAL VALVE                TRICUSPID VALVE MV Area (PHT): 5.31 cm     TR Peak grad:   5.2 mmHg MV Area VTI:   1.97 cm  TR Vmax:        114.00 cm/s MV Peak grad:  6.2 mmHg MV Mean grad:  3.0 mmHg     SHUNTS MV Vmax:       1.25 m/s     Systemic VTI:  0.21 m MV Vmean:      86.5 cm/s    Systemic Diam: 2.00 cm MV Decel Time: 143 msec MV E velocity: 85.40 cm/s MV A velocity: 127.00 cm/s MV E/A ratio:  0.67 Cara JONETTA Lovelace MD Electronically signed by Cara JONETTA Lovelace MD Signature Date/Time: 04/10/2024/4:34:50 PM    Final    CT Angio Chest PE W and/or Wo Contrast Result Date: 04/09/2024 CLINICAL DATA:  Known DVT. Increased oxygen requirement, shortness of breath. Breast cancer * Tracking Code: BO * EXAM: CT ANGIOGRAPHY CHEST WITH CONTRAST TECHNIQUE: Multidetector CT imaging of the chest was performed using the standard protocol during bolus administration of intravenous contrast. Multiplanar CT image reconstructions and MIPs were obtained to evaluate the vascular anatomy. RADIATION DOSE REDUCTION: This exam was performed according to the departmental dose-optimization program  which includes automated exposure control, adjustment of the mA and/or kV according to patient size and/or use of iterative reconstruction technique. CONTRAST:  75mL OMNIPAQUE  IOHEXOL  350 MG/ML SOLN COMPARISON:  03/03/2024. FINDINGS: Cardiovascular: Negative for pulmonary embolus. Atherosclerotic calcification of the aorta and coronary arteries. Enlarged pulmonic trunk and heart. Small to moderate pericardial effusion. Mediastinum/Nodes: Ill-defined soft tissue in the low right internal jugular station, superior mediastinum and prevascular space, grossly stable from 03/03/2024. Measurements are challenging without opacification of the involved vessels. No discrete mediastinal, hilar or axillary lymph nodes. Air in the esophagus can be seen with dysmotility. Lungs/Pleura: Image quality is degraded by expiratory phase imaging and respiratory motion. Centrilobular emphysema. Areas of volume loss in the right middle lobe, lingula and both lower lobes. Small bilateral pleural effusions, partially loculated on the left. Airway is unremarkable. Upper Abdomen: Visualized portions of the liver, gallbladder, right adrenal gland and right kidney are grossly unremarkable. Musculoskeletal: Degenerative changes in the spine. Scattered mixed lytic and sclerotic lesions predominating in the spine and manubrium. Review of the MIP images confirms the above findings. IMPRESSION: 1. Negative for pulmonary embolus. 2. Ill-defined soft tissue in the low right internal jugular station, superior mediastinum and prevascular space, previously characterized as metastatic disease, stable. Osseous metastatic disease, also stable. 3. Small to moderate pericardial effusion. 4. Small bilateral pleural effusions with atelectasis in the right middle lobe, lingula and both lower lobes. 5. Aortic atherosclerosis (ICD10-I70.0). Coronary artery calcification. 6. Enlarged pulmonic trunk, indicative of pulmonary arterial hypertension. 7.  Emphysema  (ICD10-J43.9). Electronically Signed   By: Newell Eke M.D.   On: 04/09/2024 13:15   DG Chest 2 View Result Date: 04/09/2024 EXAM: 2 VIEW(S) XRAY OF THE CHEST 04/09/2024 09:07:00 AM COMPARISON: 01/03/2024 and CT chest 03/04/2023. CLINICAL HISTORY: 79 year old female with shortness of breath. Breast cancer . FINDINGS: LUNGS AND PLEURA: Lower lung volumes. Elevated right hemidiaphragm.increased pulmonary vascularity diffusely . Centrilobular emphysema demonstrated on CTA last year. No pneumothorax. HEART AND MEDIASTINUM: No acute abnormality of the cardiac and mediastinal silhouettes. BONES AND SOFT TISSUES: No acute osseous abnormality. IMPRESSION: 1. Lower lung volumes. Increased pulmonary vascular congestion, consider milder developing interstitial edema. Electronically signed by: Helayne Hurst MD MD 04/09/2024 09:20 AM EST RP Workstation: [id number]    Microbiology: Results for orders placed or performed during the hospital encounter of 04/09/24  Resp panel by RT-PCR (RSV, Flu A&B, Covid) Anterior Nasal Swab  Status: None   Collection Time: 04/09/24 10:16 AM   Specimen: Anterior Nasal Swab  Result Value Ref Range Status   SARS Coronavirus 2 by RT PCR NEGATIVE NEGATIVE Final    Comment: (NOTE) SARS-CoV-2 target nucleic acids are NOT DETECTED.  The SARS-CoV-2 RNA is generally detectable in upper respiratory specimens during the acute phase of infection. The lowest concentration of SARS-CoV-2 viral copies this assay can detect is 138 copies/mL. A negative result does not preclude SARS-Cov-2 infection and should not be used as the sole basis for treatment or other patient management decisions. A negative result may occur with  improper specimen collection/handling, submission of specimen other than nasopharyngeal swab, presence of viral mutation(s) within the areas targeted by this assay, and inadequate number of viral copies(<138 copies/mL). A negative result must be combined  with clinical observations, patient history, and epidemiological information. The expected result is Negative.  Fact Sheet for Patients:  bloggercourse.com  Fact Sheet for Healthcare Providers:  seriousbroker.it  This test is no t yet approved or cleared by the [place]  FDA and  has been authorized for detection and/or diagnosis of SARS-CoV-2 by FDA under an Emergency Use Authorization (EUA). This EUA will remain  in effect (meaning this test can be used) for the duration of the COVID-19 declaration under Section 564(b)(1) of the Act, 21 U.S.C.section 360bbb-3(b)(1), unless the authorization is terminated  or revoked sooner.       Influenza A by PCR NEGATIVE NEGATIVE Final   Influenza B by PCR NEGATIVE NEGATIVE Final    Comment: (NOTE) The Xpert Xpress SARS-CoV-2/FLU/RSV plus assay is intended as an aid in the diagnosis of influenza from Nasopharyngeal swab specimens and should not be used as a sole basis for treatment. Nasal washings and aspirates are unacceptable for Xpert Xpress SARS-CoV-2/FLU/RSV testing.  Fact Sheet for Patients: bloggercourse.com  Fact Sheet for Healthcare Providers: seriousbroker.it  This test is not yet approved or cleared by the [place]  FDA and has been authorized for detection and/or diagnosis of SARS-CoV-2 by FDA under an Emergency Use Authorization (EUA). This EUA will remain in effect (meaning this test can be used) for the duration of the COVID-19 declaration under Section 564(b)(1) of the Act, 21 U.S.C. section 360bbb-3(b)(1), unless the authorization is terminated or revoked.     Resp Syncytial Virus by PCR NEGATIVE NEGATIVE Final    Comment: (NOTE) Fact Sheet for Patients: bloggercourse.com  Fact Sheet for Healthcare Providers: seriousbroker.it  This test is not yet approved  or cleared by the [place]  FDA and has been authorized for detection and/or diagnosis of SARS-CoV-2 by FDA under an Emergency Use Authorization (EUA). This EUA will remain in effect (meaning this test can be used) for the duration of the COVID-19 declaration under Section 564(b)(1) of the Act, 21 U.S.C. section 360bbb-3(b)(1), unless the authorization is terminated or revoked.  Performed at [facility], [address] [place], [place] [address]   Respiratory (~20 pathogens) panel by PCR     Status: None   Collection Time: 04/10/24 11:50 AM   Specimen: Nasopharyngeal Swab; Respiratory  Result Value Ref Range Status   Adenovirus NOT DETECTED NOT DETECTED Final   Coronavirus 229E NOT DETECTED NOT DETECTED Final    Comment: (NOTE) The Coronavirus on the Respiratory Panel, DOES NOT test for the novel  Coronavirus (2019 nCoV)    Coronavirus HKU1 NOT DETECTED NOT DETECTED Final   Coronavirus NL63 NOT DETECTED NOT DETECTED Final   Coronavirus OC43 NOT DETECTED NOT DETECTED  Final   Metapneumovirus NOT DETECTED NOT DETECTED Final   Rhinovirus / Enterovirus NOT DETECTED NOT DETECTED Final   Influenza A NOT DETECTED NOT DETECTED Final   Influenza B NOT DETECTED NOT DETECTED Final   Parainfluenza Virus 1 NOT DETECTED NOT DETECTED Final   Parainfluenza Virus 2 NOT DETECTED NOT DETECTED Final   Parainfluenza Virus 3 NOT DETECTED NOT DETECTED Final   Parainfluenza Virus 4 NOT DETECTED NOT DETECTED Final   Respiratory Syncytial Virus NOT DETECTED NOT DETECTED Final   Bordetella pertussis NOT DETECTED NOT DETECTED Final   Bordetella Parapertussis NOT DETECTED NOT DETECTED Final   Chlamydophila pneumoniae NOT DETECTED NOT DETECTED Final   Mycoplasma pneumoniae NOT DETECTED NOT DETECTED Final    Comment: Performed at [facility], [address] [place], [place] [address]    Labs: CBC: Recent Labs  Lab 04/09/24 0839 04/10/24 1230  WBC 4.8 6.5  HGB 13.9 13.2  HCT  42.5 39.7  MCV 108.4* 106.1*  PLT 177 195   Basic Metabolic Panel: Recent Labs  Lab 04/09/24 0839 04/10/24 1230  NA 140 136  K 3.6 3.6  CL 101 96*  CO2 30 30  GLUCOSE 117* 174*  BUN 13 16  CREATININE 0.84 0.86  CALCIUM 9.7 9.5  MG 2.3 2.2   Liver Function Tests: Recent Labs  Lab 04/10/24 1230  AST 22  ALT 25  ALKPHOS 56  BILITOT 0.9  PROT 6.8  ALBUMIN 4.2   CBG: No results for input(s): GLUCAP in the last 168 hours.  Discharge time spent: greater than 30 minutes.  Signed: Alban Pepper, MD [facility] 04/13/2024 "

## 2024-04-13 NOTE — Progress Notes (Signed)
 Specialty Pharmacy Refill Coordination Note  Dana Snow is a 79 y.o. female contacted today regarding refills of specialty medication(s) Ribociclib  Succinate (KISQALI  200mg  Daily Dose)   Patient requested Delivery   Delivery date: 04/14/24   Verified address: [address]   [place] [phone]   Medication will be filled on: 04/13/24

## 2024-04-18 ENCOUNTER — Telehealth: Payer: Self-pay | Admitting: *Deleted

## 2024-04-18 NOTE — Telephone Encounter (Signed)
 Patient left vm on triage. Patient has been on 2L oxygen continuous x 10 days. She stated that she has weaned down to 1L of oxygen and she has ambulated with and without oxygen around the room. All of her oxygen values are above 88%. Patient stated that Dr. B had told her that if her sats were improved then she could d/c her oxygen. Pt is requesting a call back to discuss Dr. Damaris recommendation.  After speaking with Dr. Rennie, I returned patient's phone call and advised her to call her cardiologist/pulmonologist to discuss her oxygen needs/recommendation.patient was given oxygen therapy given her copd/pericardial effusion. She understands that cardio/pulmonary team needs to determine pt's perimeters for oxygen therapy.

## 2024-04-20 ENCOUNTER — Telehealth: Payer: Self-pay | Admitting: *Deleted

## 2024-04-20 DIAGNOSIS — C50312 Malignant neoplasm of lower-inner quadrant of left female breast: Secondary | ICD-10-CM

## 2024-04-20 NOTE — Telephone Encounter (Signed)
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 She was prescribed Trazadone 50 mg last week when she saw Dr. Rennie. She stated, I go to bed around 7 or 8. Takes me an hour to fall asleep. I sleep usually from 9-midnight. Then I wake up. I am stay in the bed and sit up in the bed all night. I finally fall asleep around 4 am and sleep till 7 am. Even though I have interrupted sleep, I still feel rested when I wake up.  She takes a nap during the day but the naps in day time around only about 30 mins long. Patient denies snoring or waking up gasping for air. However, she shared that her husband reports that she does snore. Patient denies any pain or daytime fatigue. She no longer using her oxygen therapy. She took her self-off the oxygen as her sats are above 90%. She stated that she doesn't need oxygen therapy. She does have a follow-up with pulmonary next Wed. For bedtime rituals, she drinks 1 glass of wine with her evening meals. She reports that she and her husband are both under a lot of stress due to an upcoming move to a different home. She states that her husband and she are having personal issues about her sleep habits. He is very agitated that his sleep is getting disrupted at home. She asked if she should sleep in a different room to see if this would aide her husband's sleep habit. She reports that she takes the trazadone right before she crawls into the bed. I advised the patient to take the Trazadone about an hour before she sleeps to see if this would improve her sleep habits. Advised patient on limiting light, nose and adjust room temperatures to enhance sleep  habit. Discussed options for aroma therapy, meditation, and increasing her activity and ambulation to promote exercise. Discussed that she should avoid caffeine prior to bedtime, avoid heavy fatty enriched meals prior to bedtime. Patient admits to not getting sufficient sun-shine during the day. She stated that she would use her sun room more to promote this. She currently only uses her bedroom for sleep. The patient will explore the possibility of alternative sleep locations with her husband to optimize their personal sleep environment and to limit disruptions from pt's snoring/movement.  Provider Consulted  Provider name and credentials: Msg sent to Dr. Elder  Provider instruction: Patient will try RN recommendations.    Nurse Triage Priority:  Non-urgent (review and reinforce self-care strategies)  Barriers to Care:  None identified  Nurse Triage Disposition:  Home care, return call if no improvement within 24 hours  Protocol Source:  Ruthine HERO., & Eldonna CANDIE Pee.). (2019). Telephone triage for oncology nurses (3rd ed.). Oncology Nursing Society.  "

## 2024-04-25 ENCOUNTER — Telehealth: Payer: Self-pay | Admitting: Pharmacy Technician

## 2024-04-25 NOTE — Telephone Encounter (Signed)
 Oral Oncology Patient Advocate Encounter  Prior Authorization for Kisqali  has been approved.    PA# [id number] Effective dates: 04/25/2024 through 10/22/2024  Patient may continue filling through [facility] if she resumes on medication.   Famous Eisenhardt (Patty) Chet Burnet, CPhT  [facility], [place], [facility] - Oral Chemotherapy Patient Advocate Specialist III Phone: [phone]  Fax: [phone]

## 2024-04-25 NOTE — Telephone Encounter (Signed)
 Oral Oncology Patient Advocate Encounter   Re-authorization  Paused since Mon 04/10/2024 until manually resumed - Until follow up with oncology   Member ID: [id number]   Received notification that prior authorization for Kisqali  is required.   PA submitted on CMM via Lat[employer] [id number] Status is pending     Tylee Yum (Patty) Chet Burnet, CPhT  [facility], [place], [facility] - Oral Chemotherapy Patient Advocate Specialist III Phone: [phone]  Fax: [phone]

## 2024-04-27 ENCOUNTER — Telehealth: Payer: Self-pay | Admitting: *Deleted

## 2024-04-27 DIAGNOSIS — I82622 Acute embolism and thrombosis of deep veins of left upper extremity: Secondary | ICD-10-CM

## 2024-04-27 MED ORDER — APIXABAN 5 MG PO TABS: 5.0000 mg | ORAL_TABLET | Freq: Two times a day (BID) | ORAL | 3 refills | Status: AC

## 2024-04-27 NOTE — Telephone Encounter (Signed)
 Patient called triage to get a RF of her eliquis . RF submitted to her pharmacy.

## 2024-05-02 ENCOUNTER — Telehealth: Payer: Self-pay | Admitting: *Deleted

## 2024-05-02 DIAGNOSIS — R233 Spontaneous ecchymoses: Secondary | ICD-10-CM

## 2024-05-02 DIAGNOSIS — C50312 Malignant neoplasm of lower-inner quadrant of left female breast: Secondary | ICD-10-CM

## 2024-05-02 NOTE — Telephone Encounter (Signed)
 Outgoing call to patient to arrange for [facility] apt. Patient declined apts today in the clinic as her husband has to be in court today at 2pm. She stated that her husband is her only transportation. Pt asked for an apt tomorrow afternoon at 1pm. Discussed ED triggers with patient. She gave verbal understanding of the plan. Labs added per Dr. Rona order

## 2024-05-02 NOTE — Telephone Encounter (Signed)
 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    History of the problem:  Rash-Petechiae  Onset of rash symptoms: Monday  Duration of rash symptoms: Acute  Initial location of rash: Localized on her legs  Areas to where rash has spread: no  Conjunctival involvement: No  Color of rash:   Texture of rash: Flat-non blanching red/purple pinpoint areas on her legs. Not painful  Change in character of rash with time: No  Associated Symptoms: Generalized bruising on her right arm. Denies any trauma/falls.  Aggravating factors:   Alleviating factors or treatments tried:   Recent Travel: No  Exposures: Contact with others:  No; Medication changes:  No; Skin product changes:  No  Precipitating factors not mentioned: Patient is on Eliquis  as well.  Changes in ADLs: no     Additional commentary: Also noted several large areas of bruising on her R arm the about 2-3 inches in areas.  Denies any head aches, bleeding gums, nose bleeds or blood in her stools/urine. Denies fever, nausea, vomiting or GI distress  04/10/24 - lab Platelets 195           Triage Nurse Guidance Signs and Symptoms Action  Acute skin changes and associated systemic symptoms, such as swelling of throat, stridor, wheezing, dyspnea, chest pain, severe headache, eye involvement, desquamation, high fever, or mottled skin below the waist Seek emergency care. Call an ambulance immediately for acute respiratory symptoms.  Infection: drainage from lesion Uncontrolled pruritus History of new drug  (suspected drug-induced rash in absence of respiratory symptoms) Systemic symptoms associated with infections or viral syndrome, such as fever, myalgias, or arthralgias Seek urgent care within 24 hours.  Rash related to chemotherapy, biologic therapy, or targeted therapy agents Hand-foot syndrome Papulopustular rash (often on the face and chest) Mild pruritus Mild pain or discomfort from skin alteration Non progressive symptoms Follow homecare instruc-tions. Notify MD if no improvement or if condition worsens    Patient Instruction  Disclaimer:  Patient specific and Elsevier instruction provided verbally and sent through MyChart for active users.    Report changes in itching or rash to healthcare provider. Report presence of drainage from skin lesions. Apply cool compresses to inflamed area. Apply topical medication as prescribed. Take oral medication as prescribed and notify healthcare provider of side effects. Expect drowsiness from antihistamines and take safety precautions. Wear loose-fitting cotton clothing. Keep fingernails cut short and wear soft mittens at night to avoid scratching. Avoid hot baths and showers. Avoid sunlight and use sunscreen and sun protection. Hand-foot syndrome: Refer to Hand-Foot Syndrome protocol. Follow precautions for EGFR acneform rash: Moisturize with fragrance-free cream. Apply topical steroid or antibiotic cream as ordered by clinician. Take oral antibiotics as prescribed. If indicated, avoid extreme temperatures and direct sunlight. Keep nails clean and trimmed while on therapy to avoid paronychia  If prescribed afatinib or erlotinib, take on an empty stomach, as they interact with food and may result in an increased risk of side effects.  Report the Following Problems: Rash progression No improvement over the next three days Fever that persists for 24 hours Increasing  pain or uncontrolled pruritus  Seek Emergency Care Immediately if Any of  the Following Occurs: Severe headache Difficulty breathing and throat tightening Chest pain High fever Eye involvement  Teach Back Method used:  Yes        Symptom Triage Protocol:  Dyspnea  History of the problem:  Dyspnea Attempt to obtain patient reported VS: Home Pulse Oximetry:  94%, Respiratory rate:  20  Adventitious respiratory sounds heard by phone: no  Associated symptoms: Dyspnea   Patient reported cyanosis or pallor: No  Timing of symptoms: ongoing  Aggravating factors: walking  Are these acute or chronic symptoms: chronic  Patient reported changes in mental status: no  Changes in ADLs: no     Smoking history: Never smoker     Additional commentary: Patient reports mild mucus production and fatigue. She is using her albuterol  inhaler as directed. She has a h/o of COPD. C/o intermittent shortness of breath, stable home pulse oximetry. Reviewed Follow homecare instructions and purse lip breathing techniques with patient to help with copd mgmt. The patient previously attempted to wean her self off supplemental oxygen, but her pulmonologist advised continued nighttime use, which she is doing. She d/c Mucinex for unclear reason, but plans to start it back to help with the mild mucous production. She remains short of breath, but is at her baseline.  She stated that she will be following up with pulmonary team re: home oxygen study for sleep apnea work up.     Triage Nurse Guidance Signs and Symptoms Action  Sudden, unexpected increase in dyspnea at rest Chest pain Frothy pink sputum or gross hemoptysis Facial swelling Change in mental status Seek emergency care. Call an ambulance immediately.  Temperature higher than 100.13F (38C) with suspected neutropenia Seek emergency care.   Increasing dyspnea with activity Temperature higher than 101.43F (38.6C) with-out suspected neutropenia Increased edema or swelling Change in cough or sputum production Uncontrollable  cough New-onset wheezing Seek medical care within 24 hours.  Cough Shortness of breath Follow homecare instructions and seek medical care if no improvement within 24-48 hours.   Provider Consulted  Provider name and credentials: Dr. Rennie and [facility] providiers  Provider instruction: [facility] visit and labs per dr. b   Patient Instruction  Disclaimer:  Patient specific and Elsevier instruction provided verbally and sent through MyChart for active users.    Comply with medical therapy (e.g., antibiotics, antitussives, bronchodilators, opioids), respiratory treatment, oxygen, as prescribed.  Schedule activities that require more exertion (e.g., bathing) around periods of rest.  Get adequate sleep and rest.  Drink plenty (1-2 L) of fluids (unless restricted because of cardiac dysfunction or kidney disease) to help thin out secretions (unless underlying congestive heart failure is present).  Avoid precipitating factors that worsen dyspnea (e.g., anxiety, perfumes, tobacco smoke, cold or dry air). Stay inside on days when the air quality is poor.  Monitor for fever or any sign of infection and avoid contact with individuals who are sick.  Sitting upright and pursed-lip breathing can lessen dyspnea, as can relaxation training.  Report the Following Problems: Fever (temperature higher than 101.43F [38.6C], or 100.13F [38.1C] if receiving chemotherapy or if neutropenia is suspected) Change in sputum production (color, hemoptysis) Swelling of the feet or hands  Seek Emergency Care Immediately if Any of the Following Occurs: Worsening dyspnea, especially if accompanied by chest pain or gross hemoptysis Swelling of the face Increased work of breathing (elevated respiratory or heart rate) Changes in mental status (somnolence, restlessness, confusion) Temperature higher  than 100.23F (38C) with known neutropenia  Teach Back Method used:  Yes    Nurse Triage Priority:  Urgent (obtain  medical orders as indicated with instruction for 8-24 hour or sooner follow-up)   Barriers to Care:  None identified   Nurse Triage Disposition:  In-person follow-up visit:  patient declined smc visit today due to other obligations. She accepted apts tomorrow at 1pm with M[facility] and labs.  Protocol Source:  Ruthine HERO., & Eldonna CANDIE Pee.). (2019). Telephone triage for oncology nurses (3rd ed.). Oncology Nursing Society.

## 2024-05-03 ENCOUNTER — Encounter: Payer: Self-pay | Admitting: Nurse Practitioner

## 2024-05-03 ENCOUNTER — Inpatient Hospital Stay: Admitting: Nurse Practitioner

## 2024-05-03 ENCOUNTER — Inpatient Hospital Stay: Attending: Internal Medicine

## 2024-05-03 DIAGNOSIS — R233 Spontaneous ecchymoses: Secondary | ICD-10-CM

## 2024-05-03 DIAGNOSIS — C50312 Malignant neoplasm of lower-inner quadrant of left female breast: Secondary | ICD-10-CM

## 2024-05-03 LAB — CMP (CANCER CENTER ONLY)
ALT: 14 U/L (ref 0–44)
AST: 20 U/L (ref 15–41)
Albumin: 3.8 g/dL (ref 3.5–5.0)
Alkaline Phosphatase: 57 U/L (ref 38–126)
Anion gap: 13 (ref 5–15)
BUN: 19 mg/dL (ref 8–23)
CO2: 31 mmol/L (ref 22–32)
Calcium: 10.2 mg/dL (ref 8.9–10.3)
Chloride: 97 mmol/L — ABNORMAL LOW (ref 98–111)
Creatinine: 0.8 mg/dL (ref 0.44–1.00)
GFR, Estimated: >60 mL/min
Glucose, Bld: 134 mg/dL — ABNORMAL HIGH (ref 70–99)
Potassium: 3.1 mmol/L — ABNORMAL LOW (ref 3.5–5.1)
Sodium: 141 mmol/L (ref 135–145)
Total Bilirubin: 0.5 mg/dL (ref 0.0–1.2)
Total Protein: 6.3 g/dL — ABNORMAL LOW (ref 6.5–8.1)

## 2024-05-03 LAB — CBC WITH DIFFERENTIAL (CANCER CENTER ONLY)
Abs Immature Granulocytes: 0.01 10*3/uL (ref 0.00–0.07)
Basophils Absolute: 0 10*3/uL (ref 0.0–0.1)
Basophils Relative: 1 %
Eosinophils Absolute: 0 10*3/uL (ref 0.0–0.5)
Eosinophils Relative: 1 %
HCT: 36.3 % (ref 36.0–46.0)
Hemoglobin: 12.1 g/dL (ref 12.0–15.0)
Immature Granulocytes: 0 %
Lymphocytes Relative: 31 %
Lymphs Abs: 1 10*3/uL (ref 0.7–4.0)
MCH: 34.6 pg — ABNORMAL HIGH (ref 26.0–34.0)
MCHC: 33.3 g/dL (ref 30.0–36.0)
MCV: 103.7 fL — ABNORMAL HIGH (ref 80.0–100.0)
Monocytes Absolute: 0.3 10*3/uL (ref 0.1–1.0)
Monocytes Relative: 10 %
Neutro Abs: 1.8 10*3/uL (ref 1.7–7.7)
Neutrophils Relative %: 57 %
Platelet Count: 153 10*3/uL (ref 150–400)
RBC: 3.5 MIL/uL — ABNORMAL LOW (ref 3.87–5.11)
RDW: 13.6 % (ref 11.5–15.5)
WBC Count: 3.2 10*3/uL — ABNORMAL LOW (ref 4.0–10.5)
nRBC: 0 % (ref 0.0–0.2)

## 2024-05-03 LAB — PROTIME-INR
INR: 1.2 (ref 0.8–1.2)
Prothrombin Time: 16.3 s — ABNORMAL HIGH (ref 11.4–15.2)

## 2024-05-03 LAB — APTT: aPTT: 33 s (ref 24–36)

## 2024-05-03 NOTE — Progress Notes (Unsigned)
 SOB, congestion, petechia, some trouble sleeping, is it time to use compression glove yet? BP 85/57, 95/59

## 2024-05-04 ENCOUNTER — Other Ambulatory Visit (HOSPITAL_COMMUNITY): Payer: Self-pay

## 2024-05-05 ENCOUNTER — Other Ambulatory Visit (HOSPITAL_COMMUNITY): Payer: Self-pay

## 2024-05-25 ENCOUNTER — Inpatient Hospital Stay: Admitting: Internal Medicine

## 2024-05-25 ENCOUNTER — Inpatient Hospital Stay

## 2024-06-14 ENCOUNTER — Ambulatory Visit: Admitting: Radiation Oncology
